# Patient Record
Sex: Male | Born: 1935 | Race: White | Hispanic: No | Smoking: Former smoker
Health system: Southern US, Community
[De-identification: ages and names within clinical notes are randomized; demographics above are authoritative.]

## PROBLEM LIST (undated history)

## (undated) ENCOUNTER — Ambulatory Visit

## (undated) ENCOUNTER — Ambulatory Visit: Discharge status: Absent without leave | Payer: Medicare HMO

## (undated) DIAGNOSIS — F329 Major depressive disorder, single episode, unspecified: Secondary | ICD-10-CM

## (undated) DIAGNOSIS — J42 Unspecified chronic bronchitis: Secondary | ICD-10-CM

## (undated) DIAGNOSIS — J189 Pneumonia, unspecified organism: Secondary | ICD-10-CM

## (undated) DIAGNOSIS — F32A Depression, unspecified: Secondary | ICD-10-CM

## (undated) DIAGNOSIS — K5790 Diverticulosis of intestine, part unspecified, without perforation or abscess without bleeding: Secondary | ICD-10-CM

## (undated) DIAGNOSIS — K219 Gastro-esophageal reflux disease without esophagitis: Secondary | ICD-10-CM

## (undated) DIAGNOSIS — J069 Acute upper respiratory infection, unspecified: Secondary | ICD-10-CM

## (undated) DIAGNOSIS — M109 Gout, unspecified: Secondary | ICD-10-CM

## (undated) DIAGNOSIS — I714 Abdominal aortic aneurysm, without rupture, unspecified: Secondary | ICD-10-CM

## (undated) DIAGNOSIS — I10 Essential (primary) hypertension: Secondary | ICD-10-CM

## (undated) DIAGNOSIS — G8929 Other chronic pain: Secondary | ICD-10-CM

## (undated) DIAGNOSIS — K449 Diaphragmatic hernia without obstruction or gangrene: Secondary | ICD-10-CM

## (undated) DIAGNOSIS — L03115 Cellulitis of right lower limb: Secondary | ICD-10-CM

## (undated) DIAGNOSIS — R350 Frequency of micturition: Secondary | ICD-10-CM

## (undated) DIAGNOSIS — I6529 Occlusion and stenosis of unspecified carotid artery: Secondary | ICD-10-CM

## (undated) DIAGNOSIS — E7800 Pure hypercholesterolemia, unspecified: Secondary | ICD-10-CM

## (undated) DIAGNOSIS — I809 Phlebitis and thrombophlebitis of unspecified site: Secondary | ICD-10-CM

## (undated) DIAGNOSIS — R609 Edema, unspecified: Secondary | ICD-10-CM

## (undated) DIAGNOSIS — E785 Hyperlipidemia, unspecified: Secondary | ICD-10-CM

## (undated) DIAGNOSIS — F419 Anxiety disorder, unspecified: Secondary | ICD-10-CM

## (undated) DIAGNOSIS — M1990 Unspecified osteoarthritis, unspecified site: Secondary | ICD-10-CM

## (undated) DIAGNOSIS — I639 Cerebral infarction, unspecified: Secondary | ICD-10-CM

## (undated) DIAGNOSIS — M545 Low back pain, unspecified: Secondary | ICD-10-CM

## (undated) DIAGNOSIS — J449 Chronic obstructive pulmonary disease, unspecified: Secondary | ICD-10-CM

## (undated) DIAGNOSIS — D472 Monoclonal gammopathy: Secondary | ICD-10-CM

## (undated) HISTORY — DX: Phlebitis and thrombophlebitis of unspecified site: I80.9

## (undated) HISTORY — PX: VASECTOMY: SHX75

## (undated) HISTORY — DX: Chronic obstructive pulmonary disease, unspecified: J44.9

## (undated) HISTORY — DX: Gastro-esophageal reflux disease without esophagitis: K21.9

## (undated) HISTORY — DX: Edema, unspecified: R60.9

## (undated) HISTORY — DX: Essential (primary) hypertension: I10

## (undated) HISTORY — DX: Cerebral infarction, unspecified: I63.9

## (undated) HISTORY — DX: Frequency of micturition: R35.0

## (undated) HISTORY — DX: Monoclonal gammopathy: D47.2

## (undated) HISTORY — DX: Diverticulosis of intestine, part unspecified, without perforation or abscess without bleeding: K57.90

## (undated) HISTORY — PX: APPENDECTOMY: SHX54

## (undated) HISTORY — DX: Gout, unspecified: M10.9

## (undated) HISTORY — DX: Abdominal aortic aneurysm, without rupture, unspecified: I71.40

## (undated) HISTORY — DX: Depression, unspecified: F32.A

## (undated) HISTORY — DX: Major depressive disorder, single episode, unspecified: F32.9

## (undated) HISTORY — DX: Abdominal aortic aneurysm, without rupture: I71.4

## (undated) HISTORY — DX: Hyperlipidemia, unspecified: E78.5

## (undated) HISTORY — PX: EXCISIONAL HEMORRHOIDECTOMY: SHX1541

## (undated) HISTORY — DX: Pure hypercholesterolemia, unspecified: E78.00

## (undated) HISTORY — DX: Diaphragmatic hernia without obstruction or gangrene: K44.9

## (undated) HISTORY — PX: UMBILICAL HERNIA REPAIR: SHX196

## (undated) HISTORY — DX: Occlusion and stenosis of unspecified carotid artery: I65.29

## (undated) HISTORY — PX: HERNIA REPAIR: SHX51

---

## 1997-11-30 ENCOUNTER — Ambulatory Visit (HOSPITAL_BASED_OUTPATIENT_CLINIC_OR_DEPARTMENT_OTHER): Admission: RE | Admit: 1997-11-30 | Discharge: 1997-11-30 | Payer: Self-pay | Admitting: Surgery

## 1999-02-15 ENCOUNTER — Ambulatory Visit (HOSPITAL_COMMUNITY): Admission: RE | Admit: 1999-02-15 | Discharge: 1999-02-15 | Payer: Self-pay | Admitting: Internal Medicine

## 2000-01-03 DIAGNOSIS — I639 Cerebral infarction, unspecified: Secondary | ICD-10-CM

## 2000-01-03 HISTORY — DX: Cerebral infarction, unspecified: I63.9

## 2000-06-26 ENCOUNTER — Ambulatory Visit (HOSPITAL_COMMUNITY): Admission: RE | Admit: 2000-06-26 | Discharge: 2000-06-26 | Payer: Self-pay | Admitting: Orthopaedic Surgery

## 2000-06-26 ENCOUNTER — Encounter: Payer: Self-pay | Admitting: Orthopaedic Surgery

## 2000-11-22 ENCOUNTER — Encounter: Payer: Self-pay | Admitting: Otolaryngology

## 2000-11-22 ENCOUNTER — Ambulatory Visit (HOSPITAL_COMMUNITY): Admission: RE | Admit: 2000-11-22 | Discharge: 2000-11-22 | Payer: Self-pay | Admitting: Otolaryngology

## 2001-01-07 ENCOUNTER — Encounter: Payer: Self-pay | Admitting: Surgery

## 2001-01-07 ENCOUNTER — Encounter: Admission: RE | Admit: 2001-01-07 | Discharge: 2001-01-07 | Payer: Self-pay | Admitting: Surgery

## 2005-03-22 ENCOUNTER — Ambulatory Visit: Payer: Self-pay | Admitting: Hematology & Oncology

## 2007-07-26 ENCOUNTER — Encounter: Admission: RE | Admit: 2007-07-26 | Discharge: 2007-07-26 | Payer: Self-pay | Admitting: Cardiology

## 2007-07-26 ENCOUNTER — Ambulatory Visit: Admission: RE | Admit: 2007-07-26 | Discharge: 2007-07-26 | Payer: Self-pay | Admitting: Internal Medicine

## 2009-08-31 ENCOUNTER — Ambulatory Visit: Payer: Self-pay | Admitting: Vascular Surgery

## 2010-05-17 NOTE — Consult Note (Signed)
NEW PATIENT CONSULTATION   Raymond Morris, Raymond Morris  DOB:  07/24/1931                                       08/31/2009  [id number]#:[id number]   The patient presents today for discussion of a recent diagnosis of  infrarenal abdominal aortic aneurysm.  He is an otherwise healthy  gentleman who underwent a screening ultrasound on June 30, 2009, at  [facility].  I have reviewed his report.  the only significant  finding was of a 3.5-cm infrarenal abdominal aortic aneurysm.  He had no  prior knowledge of this.  He has no symptoms referable to the aneurysm.  He does not have any prior cardiac disease.   PAST HISTORY:  significant for hypertension, elevated cholesterol and  COPD.  He does have a history of back pain which is exertional, and this  does not appear to be related to his aneurysm.  This is in his lumbar,  is equal on both sides, a dull ache, no radiation into his legs.   SOCIAL HISTORY:  he is widowed with 4 children.  He is retired.  He does  not smoke, having quit in 1978.  Does not drink alcohol.   FAMILY HISTORY:  negative for premature atherosclerotic disease.  Mother  died of at 49 with cancer.  Father also died at young age, 71, with  cancer.  One sister had a stroke and liver failure secondary to alcohol  use.   REVIEW OF SYSTEMS:  Does report weight gain up to 237 pounds.  He is 5  feet 9 inches tall.  VASCULAR:  Positive for history of phlebitis.  CARDIAC:  Shortness of breath with exertion.  GI:  Positive for reflux and hiatal hernia.  NEUROLOGIC:  Does have a history of dizziness.  PULMONARY:  asthma and wheezing.  HEMATOLOGIC:  negative.  URINARY:  He does have difficulty with urination at times.  ENT:  Change in eyesight and hearing.  MUSCULOSKELETAL:  Negative aside from back pain.  PSYCHIATRIC:  history of depression.  SKIN:  Negative.   PHYSICAL EXAM:  Well-developed, well-nourished white male appearing his  stated age, in no acute stress.   Blood pressure is 164/98, heart rate is  50, respirations 18, oxygen saturations are 99% on room air.  He is in  no acute distress.  HEENT is normal.  Chest is clear bilaterally with no  rales, rhonchi or wheezes.  Cardiovascular:  His heart is regular rate  and rhythm without murmur.  He does have normal carotid pulses without  bruits bilaterally.  His radial, femoral, popliteal and posterior tibial  pulses are 2+ without any evidence of peripheral aneurysm.  Abdominal  exam reveals obesity.  There is no tenderness.  No mass is noted.  He  does have a diastasis rectus.  Musculoskeletal:  No major deformities or  cyanosis.  Neurologic:  No focal weakness or paresthesias.  Skin:  Without ulcers or rashes.   I reviewed his medical records as provided by Dr. Renne Crigler, also reviewed  his ultrasound.  I had a long discussion with the patient, explaining  the significance of his small infrarenal aneurysm.  I explained that  there is a minimal risk of rupture currently and explained that the  natural history of these is slow, continued growth.  I recommend that we  see him for serial ultrasound followup.  I did review symptoms of  leaking aneurysm.  He knows to call EMS and report to [facility] emergently  should this occur.  He also understands there is no need to alter his  activities regarding his aneurysm.  We will see him again in 1 year with  ultrasound at that time.     Larina Earthly, M.D.  Electronically Signed   TFE/MEDQ  D:  08/31/2009  T:  08/31/2009  Job:  [id number]   cc:   Soyla Murphy. Renne Crigler, M.D.

## 2010-08-02 ENCOUNTER — Other Ambulatory Visit: Payer: Self-pay | Admitting: Internal Medicine

## 2010-08-02 DIAGNOSIS — R10811 Right upper quadrant abdominal tenderness: Secondary | ICD-10-CM

## 2010-08-03 ENCOUNTER — Ambulatory Visit
Admission: RE | Admit: 2010-08-03 | Discharge: 2010-08-03 | Disposition: A | Discharge status: Home / Self Care | Payer: Medicare HMO | Source: Ambulatory Visit | Attending: Internal Medicine | Admitting: Internal Medicine

## 2010-08-03 ENCOUNTER — Other Ambulatory Visit: Payer: Self-pay

## 2010-08-03 DIAGNOSIS — R10811 Right upper quadrant abdominal tenderness: Secondary | ICD-10-CM

## 2010-09-09 ENCOUNTER — Encounter: Payer: Self-pay | Admitting: Vascular Surgery

## 2010-10-24 ENCOUNTER — Encounter: Payer: Self-pay | Admitting: Vascular Surgery

## 2010-10-25 ENCOUNTER — Encounter (INDEPENDENT_AMBULATORY_CARE_PROVIDER_SITE_OTHER): Payer: Medicare HMO | Admitting: *Deleted

## 2010-10-25 ENCOUNTER — Ambulatory Visit (INDEPENDENT_AMBULATORY_CARE_PROVIDER_SITE_OTHER): Payer: Medicare HMO | Admitting: Vascular Surgery

## 2010-10-25 ENCOUNTER — Encounter: Payer: Self-pay | Admitting: Vascular Surgery

## 2010-10-25 VITALS — BP 136/80 | HR 50 | Resp 16 | Ht 68.5 in | Wt 233.4 lb

## 2010-10-25 DIAGNOSIS — I714 Abdominal aortic aneurysm, without rupture: Secondary | ICD-10-CM

## 2010-10-25 NOTE — Progress Notes (Signed)
The patient presents today for continued discussion of his small infrarenal abdominal aortic aneurysm. It seen him in August 2008. He had a ultrasound from an outlying facility showing a 3.5 cm abdominal aortic aneurysm. He has no new symptoms referable to his aneurysm. He does have a new diagnosis of some cardiac difficulties and is to undergo cardiac catheterization.  Past Medical History  Diagnosis Date  . Hyperlipidemia   . Hypertension   . COPD (chronic obstructive pulmonary disease)   . Depression   . Asthma   . GERD (gastroesophageal reflux disease)   . Hiatal hernia   . Phlebitis   . AAA (abdominal aortic aneurysm)     History  Substance Use Topics  . Smoking status: Former Smoker    Types: Cigarettes    Quit date: 09/07/1976  . Smokeless tobacco: Not on file  . Alcohol Use: No    Family History  Problem Relation Age of Onset  . Cancer Mother   . Cancer Father   . Stroke Sister   . Liver disease Sister     Allergies  Allergen Reactions  . Ceclor (Cefaclor)     Causes muscle spasms  . Sulfa Antibiotics Rash    Current outpatient prescriptions:aspirin EC 81 MG tablet, Take 81 mg by mouth daily.  , Disp: , Rfl: ;  cetirizine (ZYRTEC) 10 MG chewable tablet, Chew 10 mg by mouth daily.  , Disp: , Rfl: ;  escitalopram (LEXAPRO) 20 MG tablet, Take 10 mg by mouth daily.  , Disp: , Rfl: ;  esomeprazole (NEXIUM) 20 MG capsule, Take 40 mg by mouth daily before breakfast.  , Disp: , Rfl:  Nebivolol HCl (BYSTOLIC) 20 MG TABS, Take by mouth. Take 1/4 tablet daily , Disp: , Rfl: ;  omega-3 acid ethyl esters (LOVAZA) 1 G capsule, Take 2 g by mouth daily.  , Disp: , Rfl: ;  rosuvastatin (CRESTOR) 20 MG tablet, Take by mouth. Take 1/2 tab every other day, Disp: , Rfl: ;  Tamsulosin HCl (FLOMAX) 0.4 MG CAPS, Take 0.4 mg by mouth daily.  , Disp: , Rfl:   BP 136/80  Pulse 50  Resp 16  Ht 5' 8.5" (1.74 m)  Wt 233 lb 6.4 oz (105.87 kg)  BMI 34.97 kg/m2  Body mass index is 34.97  kg/(m^2).       The school exam well-developed mildly obese white male no acute distress HEENT is normal his radial and posterior tibial pulses are 2+ bilaterally. He does have moderate obesity I cannot palpate an aneurysm. He is grossly intact neurologically.  Ultrasound abdominal aortic aneurysm: Maximal diameter of 2.8 cm.  Impression and plan: Small infrarenal abdominal aortic aneurysm. Discuss this at length with the patient. I spared the specimen essentially no risk for rupture with a small size. Plan to see him again in 2 years with repeat ultrasound for followup of his ectasia of his infrarenal abdominal aorta

## 2010-11-01 NOTE — Procedures (Unsigned)
DUPLEX ULTRASOUND OF ABDOMINAL AORTA  INDICATION:  AAA  HISTORY: Diabetes:  No Cardiac:  No Hypertension:  Yes Smoking:  Quit 1976 Connective Tissue Disorder: Family History:  No Previous Surgery:  No  DUPLEX EXAM:         AP (cm)                   TRANSVERSE (cm) Proximal             Not visualized            Not visualized Mid                  2.79 cm                   2.83 cm Distal               2.79 cm                   2.76 cm Right Iliac          1.34 cm                   1.30 cm Left Iliac           1.26 cm                   1.30 cm  PREVIOUS:  Date:  AP:  TRANSVERSE:  IMPRESSION: 1. Aneurysmal dilatation measuring 2.79 x 2.82 cm. 2. Limited visualization in the proximal and distal segments due to     overlying bowel gas.  ___________________________________________ Larina Earthly, M.D.  EM/MEDQ  D:  10/25/2010  T:  10/25/2010  Job:  [id number]

## 2010-11-10 ENCOUNTER — Encounter: Payer: Self-pay | Admitting: Vascular Surgery

## 2010-11-10 DIAGNOSIS — I714 Abdominal aortic aneurysm, without rupture, unspecified: Secondary | ICD-10-CM | POA: Insufficient documentation

## 2012-09-17 ENCOUNTER — Other Ambulatory Visit: Payer: Self-pay | Admitting: *Deleted

## 2012-09-17 DIAGNOSIS — I714 Abdominal aortic aneurysm, without rupture: Secondary | ICD-10-CM

## 2012-10-07 ENCOUNTER — Encounter: Payer: Self-pay | Admitting: Family

## 2012-10-08 ENCOUNTER — Ambulatory Visit (INDEPENDENT_AMBULATORY_CARE_PROVIDER_SITE_OTHER): Payer: Medicare HMO | Admitting: Family

## 2012-10-08 ENCOUNTER — Encounter: Payer: Self-pay | Admitting: Family

## 2012-10-08 ENCOUNTER — Ambulatory Visit (HOSPITAL_COMMUNITY)
Admission: RE | Admit: 2012-10-08 | Discharge: 2012-10-08 | Disposition: A | Discharge status: Home / Self Care | Payer: Medicare HMO | Source: Ambulatory Visit | Attending: Family | Admitting: Family

## 2012-10-08 VITALS — BP 134/86 | HR 51 | Resp 16 | Ht 69.0 in | Wt 246.0 lb

## 2012-10-08 DIAGNOSIS — I714 Abdominal aortic aneurysm, without rupture, unspecified: Secondary | ICD-10-CM | POA: Insufficient documentation

## 2012-10-08 DIAGNOSIS — Z8673 Personal history of transient ischemic attack (TIA), and cerebral infarction without residual deficits: Secondary | ICD-10-CM

## 2012-10-08 DIAGNOSIS — I6350 Cerebral infarction due to unspecified occlusion or stenosis of unspecified cerebral artery: Secondary | ICD-10-CM

## 2012-10-08 NOTE — Progress Notes (Signed)
[facility] [place]  Established Abdominal Aortic Aneurysm  History of Present Illness  Raymond Morris is a 79 y.o. (03-15-1931) male patient that Dr. Arbie Cookey has been following for his small infrarenal abdominal aortic aneurysm. He had an ultrasound from an outlying facility showing a 3.5 cm abdominal aortic aneurysm.  The patient does have chronic low back pain, and reports abdominal pain only with palpation from a ventral hernia.  The patient is not a smoker. The patient reports claudication symptoms in legs with walking which resolves with rest; he states he had testing of his legs with blood pressures in his ankles requested by his PCP, done about 2 years ago; states he has not heard the results. He describes this as legs feeling weak; he also states that he has low back pain and has been told that he has sciatica. The patient denies history of stroke or TIA symptoms. States he was told that he has blocked arteries in his heart but has had no stents placed nor CABG, followed by Dr. Donnie Aho, pt. States he declined cardiac cath advised by Dr. Donnie Aho. He reports transient mild focal headaches in different areas of his head for about a year.  Pt Diabetic: No  Pt smoker: smoker  (quit 1978 )  Past Medical History  Diagnosis Date  . Hyperlipidemia   . Hypertension   . COPD (chronic obstructive pulmonary disease)   . Depression   . Asthma   . GERD (gastroesophageal reflux disease)   . Hiatal hernia   . Phlebitis   . AAA (abdominal aortic aneurysm)    Past Surgical History  Procedure Laterality Date  . Appendectomy    . Umbilical hernia repair    . Hemorrhoid surgery     Social History History   Social History  . Marital Status: Married    Spouse Name: N/A    Number of Children: N/A  . Years of Education: N/A   Occupational History  . Not on file.   Social History Main Topics  . Smoking status: Former Smoker    Types: Cigarettes    Quit date:  09/07/1976  . Smokeless tobacco: Not on file  . Alcohol Use: No  . Drug Use:   . Sexual Activity:    Other Topics Concern  . Not on file   Social History Narrative  . No narrative on file   Family History Family History  Problem Relation Age of Onset  . Cancer Mother   . Cancer Father   . Stroke Sister   . Liver disease Sister     Current Outpatient Prescriptions on File Prior to Visit  Medication Sig Dispense Refill  . aspirin EC 81 MG tablet Take 81 mg by mouth daily.        . cetirizine (ZYRTEC) 10 MG chewable tablet Chew 10 mg by mouth daily.        Marland Kitchen escitalopram (LEXAPRO) 20 MG tablet Take 10 mg by mouth daily.        Marland Kitchen esomeprazole (NEXIUM) 20 MG capsule Take 40 mg by mouth daily before breakfast.        . Nebivolol HCl (BYSTOLIC) 20 MG TABS Take by mouth. Take 1/4 tablet daily       . omega-3 acid ethyl esters (LOVAZA) 1 G capsule Take 2 g by mouth daily.        . rosuvastatin (CRESTOR) 20 MG tablet Take by mouth. Take 1/2 tab every other day      .  Tamsulosin HCl (FLOMAX) 0.4 MG CAPS Take 0.4 mg by mouth daily.         No current facility-administered medications on file prior to visit.   Allergies  Allergen Reactions  . Ceclor [Cefaclor]     Causes muscle spasms  . Sulfa Antibiotics Rash    ROS: [x]  Positive   [ ]  Negative   [ ]  All sytems reviewed and are negative  General: [ ]  Weight loss, [ ]  Fever, [ ]  chills Neurologic: [ ]  Dizziness, [ ]  Blackouts, [ ]  Seizure [ ]  Stroke, [ ]  "Mini stroke", [ ]  Slurred speech, [ ]  Temporary blindness; [ ]  weakness in arms or legs, [ ]  Hoarseness Cardiac: [ ]  Chest pain/pressure, [ ]  Shortness of breath at rest [ ]  Shortness of breath with exertion, [ ]  Atrial fibrillation or irregular heartbeat Vascular: [ ]  Pain in legs with walking, [ ]  Pain in legs at rest, [ ]  Pain in legs at night,  [ ]  Non-healing ulcer, [ ]  Blood clot in vein/DVT,   Pulmonary: [ ]  Home oxygen, [ ]  Productive cough, [ ]  Coughing up blood, [ ]   Asthma,  [ ]  Wheezing Musculoskeletal:  [ ]  Arthritis, [ ]  Low back pain, [ ]  Joint pain Hematologic: [ ]  Easy Bruising, [ ]  Anemia; [ ]  Hepatitis Gastrointestinal: [ ]  Blood in stool, [ ]  Gastroesophageal Reflux/heartburn, [ ]  Trouble swallowing Urinary: [ ]  chronic Kidney disease, [ ]  on HD - [ ]  MWF or [ ]  TTHS, [ ]  Burning with urination, [ ]  Difficulty urinating Skin: [ ]  Rashes, [ ]  Wounds Psychological: [ ]  Anxiety, [ ]  Depression  Physical Examination  Filed Vitals:   10/08/12 0914  BP: 134/86  Pulse: 51  Resp: 16   Filed Weights   10/08/12 0914  Weight: 246 lb (111.585 kg)   Body mass index is 36.31 kg/(m^2).  General: A&O x 3, WD, Obese.   Pulmonary: Sym exp, good air movt, CTAB, no rales, rhonchi, or wheezing.   Cardiac: RRR, Nl S1, S2, no Murmurs, rubs or gallops.  Carotid Bruits Left Right   Negative Negative   Aorta is not palpable. Radial pulses are 2+ palpable bilaterally.                          VASCULAR EXAM:                                                                                                         LE Pulses LEFT RIGHT       POPLITEAL  not palpable   not palpable       POSTERIOR TIBIAL   palpable    palpable        DORSALIS PEDIS      ANTERIOR TIBIAL palpable   palpable      Gastrointestinal: soft, NTND, -G/R, - HSM, - masses, - CVAT B. Ventral hernia apparent only when pt. is in the process of lying down or sitting up from supine position.  Musculoskeletal: M/S 5/5  throughout, Extremities without ischemic changes.   Neurologic: CN 2-12 intact, Pain and light touch intact in extremities, Motor exam as listed above.  Non-Invasive Vascular Imaging  AAA Duplex (10/08/2012) Previous size: 2.8 cm (Date: 10/25/2010)  Current size:  3.11 x 3.16 cm (Date: 10/08/2012)  Other Imaging:  MRI of head result on file from 2002 :  CLINICAL DATA: VERTIGO. FAMILY HISTORY OF ANEURYSM.  MRI BRAIN WITHOUT AND WITH CONTRAST  NO COMPARISON.   THERE IS MILD ATROPHY. THERE IS A SMALL LEFT FRONTAL WHITE MATTER INFARCT WHICH IS CHRONIC. NO  ACUTE INFARCT IS SEEN AND NO MASS LESION IDENTIFIED. THE ENHANCEMENT PATTERN IS NORMAL.  IMPRESSION  NEGATIVE.  MRA INTRACRANIAL  3D TIME OF FLIGHT TECHNIQUE WAS UTILIZED. NO ANEURYSM IS IDENTIFIED. NO INTRACRANIAL STENOSIS IS  IDENTIFIED.  IMPRESSION  NEGATIVE.  Medical Decision Making  The patient is a 79 y.o. male who presents with asymptomatic AAA with minimal increasing size in 2 years.   Based on this patient's exam and diagnostic studies, the patient will follow up in 2 years  with the following studies: AAA Duplex, 1 month for carotid Duplex since he had CVA by MRA of head in 2002, but no stroke symptoms as far as patient knows.  The threshold for repair is AAA size > 5.5 cm, growth > 1 cm/yr, and symptomatic status.  I emphasized the importance of maximal medical management including strict control of blood pressure, blood glucose, and lipid levels, antiplatelet agents, obtaining regular exercise, and cessation of smoking.   The patient was given information about AAA including signs, symptoms, treatment, and how to minimize the risk of enlargement and rupture of aneurysms.  The patient was given information about stroke prevention and symptoms that should prompt him to seek immediate medical attention.    The patient was advised to call 911 should the patient experience sudden onset abdominal or back pain.   Thank you for allowing [place] to participate in this patient's care.  Charisse March, RN, MSN, FNP-C [facility] [place] Office: [phone]  Clinic Physician: Early  10/08/2012, 9:06 AM

## 2012-10-08 NOTE — Patient Instructions (Addendum)
Abdominal Aortic Aneurysm  An aneurysm is the enlargement (dilatation), bulging, or ballooning out of part of the wall of a vein or artery. An aortic aneurysm is a bulging in the largest artery of the body. This artery supplies blood from the heart to the rest of the body.  The first part of the aorta is called the thoracic aorta. It leaves the heart, rises (ascends), arches, and goes down (descends) through the chest until it reaches the diaphragm. The diaphragm is the muscular part between the chest and abdomen.  The second part of the aorta is called the abdominal aorta after it has passed the diaphragm and continues down through the abdomen. The abdominal aorta ends where it splits to form the two iliac arteries that go to the legs. Aortic aneurysms can develop anywhere along the length of the aorta. The majority are located along the abdominal aorta. The major concern with an aortic aneurysm is that it can enlarge and rupture. This can cause death unless diagnosed and treated promptly. Aneurysms can also develop blood clots or infections. CAUSES  Many aortic aneurysms are caused by arteriosclerosis. Arteriosclerosis can weaken the aortic wall. The pressure of the blood being pumped through the aorta causes it to balloon out at the site of weakness. Therefore, high blood pressure (hypertension) is associated with aneurysm. Other risk factors include:  Age over 60.  Tobacco use.  Being male.  White race.  Family history of aneurysm.  Less frequent causes of abdominal aortic aneurysms include:  Connective tissue diseases.  Abdominal trauma.  Inflammation of blood vessles (arteritis).  Inherited (congenital) malformations.  Infection. SYMPTOMS  The signs and symptoms of an unruptured aneurysm will partly depend on its size and rate of growth.   Abdominal aortic aneurysms may cause pain. The pain typically has a deep quality as if it is piercing into the person. It is felt most  often in the lower back area. The pain is usually steady but may be relieved by changing your body position.  The person may also become aware of an abnormally prominent pulse in the belly (abdominal pulsation). DIAGNOSIS  An aortic aneurysm may be discovered by chance on physical exam, or on X-ray studies done for other reasons. It may be suspected because of other problems such as back or abdominal pain. The following tests may help identify the problem.  X-rays of the abdomen can show calcium deposits in the aneurysm wall.  CT scanning of the abdomen, particularly with contrast medium, is accurate at showing the exact size and shape of the aneurysm.  Ultrasounds give a clear picture of the size of an aneurysm (about 98% accuracy).  MRI scanning is accurate, but often unnecessary.  An abdominal angiogram shows the source of the major blood vessels arising from the aorta. It reveals the size and extent of any aneurysm. It can also show a clot clinging to the wall of the aneurysm (mural thrombus). TREATMENT  Treating an abdominal aortic aneurysm depends on the size. A rupture of an aneurysm is uncommon when they are less than 5 cm wide (2 inches). Rupture is far more common in aneurysms that are over 6 cm wide (2.4 inches).  Surgical repair is usually recommended for all aneurysms over 6 cm wide (2.4 inches). This depends on the health, age, and other circumstances of the individual. This type of surgery consists of opening the abdomen, removing the aneurysm, and sewing a synthetic graft (similar to a cloth tube) in its place. A   less invasive form of this surgery, using stent grafts, is sometimes recommended.  For most patients, elective repair is recommended for aneurysms between 4 and 6 cm (1.6 and 2.4 inches). Elective means the surgery can be done at your convenience. This should not be put off too long if surgery is recommended.  If you smoke, stop immediately. Smoking is a major risk  factor for enlargement and rupture.  Medications may be used to help decrease complications  these include medicine to lower blood pressure and control cholesterol. HOME CARE INSTRUCTIONS   If you smoke, stop. Do not start smoking.  Take all medications as prescribed.  Your caregiver will tell you when to have your aneurysm rechecked, either by ultrasound or CT scan.  If your caregiver has given you a follow-up appointment, it is very important to keep that appointment. Not keeping the appointment could result in a chronic or permanent injury, pain, or disability. If there is any problem keeping the appointment, you must call back to this facility for assistance. SEEK MEDICAL CARE IF:   You develop mild abdominal pain or pressure.  You are able to feel or perceive your aneurysm, and you sense any change. SEEK IMMEDIATE MEDICAL CARE IF:   You develop severe abdominal pain, or severe pain moving (radiating) to your back.  You suddenly develop cold or blue toes or feet.  You suddenly develop lightheadedness or fainting spells. MAKE SURE YOU:   Understand these instructions.  Will watch your condition.  Will get help right away if you are not doing well or get worse. Document Released: 09/28/2004 Document Revised: 03/13/2011 Document Reviewed: 07/23/2007 ExitCare Patient Information 2014 [employer]. Stroke Prevention Some medical conditions and behaviors are associated with an increased chance of having a stroke. You may prevent a stroke by making healthy choices and managing medical conditions. Reduce your risk of having a stroke by:  Staying physically active. Get at least 30 minutes of activity on most or all days.  Not smoking. It may also be helpful to avoid exposure to secondhand smoke.  Limiting alcohol use. Moderate alcohol use is considered to be:  No more than 2 drinks per day for men.  No more than 1 drink per day for nonpregnant women.  Eating healthy  foods.  Include 5 or more servings of fruits and vegetables a day.  Certain diets may be prescribed to address high blood pressure, high cholesterol, diabetes, or obesity.  Managing your cholesterol levels.  A low-saturated fat, low-trans fat, low-cholesterol, and high-fiber diet may control cholesterol levels.  Take any prescribed medicines to control cholesterol as directed by your caregiver.  Managing your diabetes.  A controlled-carbohydrate, controlled-sugar diet is recommended to manage diabetes.  Take any prescribed medicines to control diabetes as directed by your caregiver.  Controlling your high blood pressure (hypertension).  A low-salt (sodium), low-saturated fat, low-trans fat, and low-cholesterol diet is recommended to manage high blood pressure.  Take any prescribed medicines to control hypertension as directed by your caregiver.  Maintaining a healthy weight.  A reduced-calorie, low-sodium, low-saturated fat, low-trans fat, low-cholesterol diet is recommended to manage weight.  Stopping drug abuse.  Avoiding birth control pills.  Talk to your caregiver about the risks of taking birth control pills if you are over 35 years old, smoke, get migraines, or have ever had a blood clot.  Getting evaluated for sleep disorders (sleep apnea).  Talk to your caregiver about getting a sleep evaluation if you snore a lot or have   excessive sleepiness.  Taking medicines as directed by your caregiver.  For some people, aspirin or blood thinners (anticoagulants) are helpful in reducing the risk of forming abnormal blood clots that can lead to stroke. If you have the irregular heart rhythm of atrial fibrillation, you should be on a blood thinner unless there is a good reason you cannot take them.  Understand all your medicine instructions. SEEK IMMEDIATE MEDICAL CARE IF:   You have sudden weakness or numbness of the face, arm, or leg, especially on one side of the  body.  You have sudden confusion.  You have trouble speaking (aphasia) or understanding.  You have sudden trouble seeing in one or both eyes.  You have sudden trouble walking.  You have dizziness.  You have a loss of balance or coordination.  You have a sudden, severe headache with no known cause.  You have new chest pain or an irregular heartbeat. Any of these symptoms may represent a serious problem that is an emergency. Do not wait to see if the symptoms will go away. Get medical help right away. Call your local emergency services (911 in [place] Do not drive yourself to the hospital. Document Released: 01/27/2004 Document Revised: 03/13/2011 Document Reviewed: 08/08/2010 Exit[employer] Patient Information 2014 [employer].  

## 2012-10-09 NOTE — Addendum Note (Signed)
Addended by: Adria Dill L on: 10/09/2012 11:45 AM   Modules accepted: Orders

## 2012-10-09 NOTE — Addendum Note (Signed)
Addended by: Adria Dill L on: 10/09/2012 03:25 PM   Modules accepted: Orders

## 2012-11-01 ENCOUNTER — Encounter: Payer: Self-pay | Admitting: Family

## 2012-11-04 ENCOUNTER — Encounter: Payer: Self-pay | Admitting: Family

## 2012-11-04 ENCOUNTER — Other Ambulatory Visit: Payer: Self-pay | Admitting: Family

## 2012-11-04 ENCOUNTER — Ambulatory Visit (INDEPENDENT_AMBULATORY_CARE_PROVIDER_SITE_OTHER): Payer: Medicare HMO | Admitting: Family

## 2012-11-04 ENCOUNTER — Ambulatory Visit (HOSPITAL_COMMUNITY)
Admission: RE | Admit: 2012-11-04 | Discharge: 2012-11-04 | Disposition: A | Discharge status: Home / Self Care | Payer: Medicare HMO | Source: Ambulatory Visit | Attending: Family | Admitting: Family

## 2012-11-04 DIAGNOSIS — I6350 Cerebral infarction due to unspecified occlusion or stenosis of unspecified cerebral artery: Secondary | ICD-10-CM

## 2012-11-04 DIAGNOSIS — Z8679 Personal history of other diseases of the circulatory system: Secondary | ICD-10-CM

## 2012-11-04 DIAGNOSIS — I6529 Occlusion and stenosis of unspecified carotid artery: Secondary | ICD-10-CM | POA: Insufficient documentation

## 2012-11-04 NOTE — Patient Instructions (Signed)
Stroke Prevention Some medical conditions and behaviors are associated with an increased chance of having a stroke. You may prevent a stroke by making healthy choices and managing medical conditions. Reduce your risk of having a stroke by:  Staying physically active. Get at least 30 minutes of activity on most or all days.  Not smoking. It may also be helpful to avoid exposure to secondhand smoke.  Limiting alcohol use. Moderate alcohol use is considered to be:  No more than 2 drinks per day for men.  No more than 1 drink per day for nonpregnant women.  Eating healthy foods.  Include 5 or more servings of fruits and vegetables a day.  Certain diets may be prescribed to address high blood pressure, high cholesterol, diabetes, or obesity.  Managing your cholesterol levels.  A low-saturated fat, low-trans fat, low-cholesterol, and high-fiber diet may control cholesterol levels.  Take any prescribed medicines to control cholesterol as directed by your caregiver.  Managing your diabetes.  A controlled-carbohydrate, controlled-sugar diet is recommended to manage diabetes.  Take any prescribed medicines to control diabetes as directed by your caregiver.  Controlling your high blood pressure (hypertension).  A low-salt (sodium), low-saturated fat, low-trans fat, and low-cholesterol diet is recommended to manage high blood pressure.  Take any prescribed medicines to control hypertension as directed by your caregiver.  Maintaining a healthy weight.  A reduced-calorie, low-sodium, low-saturated fat, low-trans fat, low-cholesterol diet is recommended to manage weight.  Stopping drug abuse.  Avoiding birth control pills.  Talk to your caregiver about the risks of taking birth control pills if you are over 35 years old, smoke, get migraines, or have ever had a blood clot.  Getting evaluated for sleep disorders (sleep apnea).  Talk to your caregiver about getting a sleep evaluation  if you snore a lot or have excessive sleepiness.  Taking medicines as directed by your caregiver.  For some people, aspirin or blood thinners (anticoagulants) are helpful in reducing the risk of forming abnormal blood clots that can lead to stroke. If you have the irregular heart rhythm of atrial fibrillation, you should be on a blood thinner unless there is a good reason you cannot take them.  Understand all your medicine instructions. SEEK IMMEDIATE MEDICAL CARE IF:   You have sudden weakness or numbness of the face, arm, or leg, especially on one side of the body.  You have sudden confusion.  You have trouble speaking (aphasia) or understanding.  You have sudden trouble seeing in one or both eyes.  You have sudden trouble walking.  You have dizziness.  You have a loss of balance or coordination.  You have a sudden, severe headache with no known cause.  You have new chest pain or an irregular heartbeat. Any of these symptoms may represent a serious problem that is an emergency. Do not wait to see if the symptoms will go away. Get medical help right away. Call your local emergency services (911 in [place] Do not drive yourself to the hospital. Document Released: 01/27/2004 Document Revised: 03/13/2011 Document Reviewed: 08/08/2010 [employer] Patient Information 2014 [employer].  

## 2012-11-04 NOTE — Progress Notes (Signed)
Established Carotid Patient   History of Present Illness  Raymond Morris is a 79 y.o. 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 He was seen last month for scheduled surveillance of AAA. Patient has not had previous CEA. Has chronic low back pain, was advised years ago that he has DDD, describes what sounds like bilateral sciatic pain with walking. He does not walk much due to this. States he has not taken his medications today yet as he did not not if he should before this visit; advised to take his medications as soon as he gets home, is slightly hypertensive now.  Patient denies New Medical or Surgical History.  Pt Diabetic: No Pt smoker: former smoker, quit 1978  Pt meds include: Statin : Yes Betablocker: Yes ASA: Yes Other anticoagulants/antiplatelets: no   Past Medical History  Diagnosis Date  . Hyperlipidemia   . Hypertension   . COPD (chronic obstructive pulmonary disease)   . Depression   . Asthma   . GERD (gastroesophageal reflux disease)   . Hiatal hernia   . Phlebitis   . AAA (abdominal aortic aneurysm)     Social History History  Substance Use Topics  . Smoking status: Former Smoker    Types: Cigarettes    Quit date: 09/07/1976  . Smokeless tobacco: Never Used  . Alcohol Use: No    Family History Family History  Problem Relation Age of Onset  . Cancer Mother   . Cancer Father   . Stroke Sister   . Liver disease Sister     Surgical History Past Surgical History  Procedure Laterality Date  . Appendectomy    . Umbilical hernia repair    . Hemorrhoid surgery      Allergies  Allergen Reactions  . Ceclor [Cefaclor]     Causes muscle spasms  . Sulfa Antibiotics Rash    Current Outpatient Prescriptions  Medication Sig Dispense Refill  . aspirin EC 81 MG tablet Take 81 mg by mouth daily.        . cetirizine (ZYRTEC) 10 MG chewable tablet Chew  10 mg by mouth daily.        Marland Kitchen escitalopram (LEXAPRO) 20 MG tablet Take 10 mg by mouth daily.        Marland Kitchen esomeprazole (NEXIUM) 20 MG capsule Take 40 mg by mouth daily before breakfast.        . hydrochlorothiazide (HYDRODIURIL) 25 MG tablet Take 25 mg by mouth daily.      . Nebivolol HCl (BYSTOLIC) 20 MG TABS Take by mouth. Take 1/4 tablet daily       . omega-3 acid ethyl esters (LOVAZA) 1 G capsule Take 2 g by mouth daily.        . rosuvastatin (CRESTOR) 20 MG tablet Take by mouth. Take 1/2 tab every other day      . Tamsulosin HCl (FLOMAX) 0.4 MG CAPS Take 0.4 mg by mouth daily.         No current facility-administered medications for this visit.    Review of Systems : [x]  Positive   [ ]  Denies  General:[ ]  Weight loss,  [ ]  Weight gain, [ ]  Loss of appetite, [ ]  Fever, [ ]  chills  Neurologic: [ ]  Dizziness, [ ]  Blackouts, [ ]  Headaches, [ ]  Seizure [ ]  Stroke, [ ]  "Mini stroke", [ ]  Slurred speech, [ ]   Temporary blindness;  [ ] weakness,  Ear/Nose/Throat: [ ]  Change in hearing, [ ]  Nose bleeds, [ ]  Hoarseness  Vascular:[ ]  Pain in legs with walking, [ ]  Pain in feet while lying flat , [ ]   Non-healing ulcer, [ ]  Blood clot in vein,    Pulmonary: [ ]  Home oxygen, [ ]   Productive cough, [ ]  Bronchitis, [ ]  Coughing up blood,  [ ]  Asthma, [ ]  Wheezing  Musculoskeletal:  [ ]  Arthritis, [ ]  Joint pain, [ ]  low back pain  Cardiac: [ ]  Chest pain, [ ]  Shortness of breath when lying flat, [ ]  Shortness of breath with exertion, [ ]  Palpitations, [ ]  Heart murmur, [ ]   Atrial fibrillation  Hematologic:[ ]  Easy Bruising, [ ]  Anemia; [ ]  Hepatitis  Psychiatric: [ ]   Depression, [ ]  Anxiety   Gastrointestinal: [ ]  Black stool, [ ]  Blood in stool, [ ]  Peptic ulcer disease,  [ ]  Gastroesophageal Reflux, [ ]  Trouble swallowing, [ ]  Diarrhea, [ ]  Constipation  Urinary: [ ]  chronic Kidney disease, [ ]  on HD, [ ]  Burning with urination, [ ]  Frequent urination, [ ]  Difficulty urinating;   Skin: [ ]   Rashes, [ ]  Wounds    Physical Examination  Filed Vitals:   11/04/12 1229  BP: 146/82  Pulse: 54  Resp:    Filed Weights   11/04/12 1225  Weight: 246 lb (111.585 kg)   Body mass index is 36.31 kg/(m^2).  General: WDWN male in NAD, obese. GAIT: antalgic Eyes: PERRLA Pulmonary:  CTAB, Negative  Rales, Negative rhonchi, & transient expiratory wheezing in left base.  Cardiac: regular Rhythm ,  Positive Murmurs.  VASCULAR EXAM Carotid Bruits Left Right   Negative Negative   Aorta is not palpable.  Radial pulses are 2+ palpable bilaterally.   LE Pulses  LEFT  RIGHT   POPLITEAL  not palpable  not palpable   POSTERIOR TIBIAL  palpable  palpable   DORSALIS PEDIS  ANTERIOR TIBIAL  palpable  palpable     Gastrointestinal: soft, NTND, -G/R, - HSM, - masses, - CVAT B. Ventral hernia apparent only when pt. is in the process of lying down or sitting up from supine position.   Musculoskeletal: M/S 5/5 throughout, Extremities without ischemic changes.   Neurologic: CN 2-12 intact, Pain and light touch intact in extremities, Motor exam as listed above.   Non-Invasive Vascular Imaging  AAA Duplex (10/08/2012)  Previous size: 2.8 cm (Date: 10/25/2010)  Most recent size: 3.11 x 3.16 cm (Date: 10/08/2012)  Non-Invasive Vascular Imaging CAROTID DUPLEX 11/04/2012   Right ICA: <40% stenosis. Left ICA: no stenosis.  No previous studies for comparison.  Assessment: Raymond Morris is a 79 y.o. 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 He was seen last month for scheduled surveillance of AAA. He is asymptomatic with  <40% Right ICA stenosis and no left ICA stenosis. No previous carotid studies for comparison; will not need to repeat carotid studies since he has no ICA disease. CVD risk factors and healthy lifestyle measures discussed.  Plan:  He is already scheduled to return in 2  years for AAA Duplex.  I discussed in depth with the patient the nature of atherosclerosis, and emphasized the importance of maximal medical management including strict control of blood pressure, blood glucose, and lipid levels, obtaining regular exercise, and continued cessation of  smoking.  The patient is aware that without maximal medical management the underlying atherosclerotic disease process will progress, limiting the benefit of any interventions. The patient was given information about stroke prevention and what symptoms should prompt the patient to seek immediate medical care. Thank you for allowing [place] to participate in this patient's care.  Charisse March, RN, MSN, FNP-C [facility] Office: [phone]  Clinic Physician: Myra Gianotti  11/04/2012 11:23 AM

## 2012-11-14 ENCOUNTER — Ambulatory Visit: Payer: Medicare HMO | Admitting: Family

## 2012-11-14 ENCOUNTER — Other Ambulatory Visit (HOSPITAL_COMMUNITY): Payer: Medicare HMO

## 2013-07-29 ENCOUNTER — Other Ambulatory Visit (HOSPITAL_COMMUNITY): Payer: Self-pay | Admitting: Internal Medicine

## 2013-07-29 DIAGNOSIS — R1310 Dysphagia, unspecified: Secondary | ICD-10-CM

## 2013-08-01 ENCOUNTER — Ambulatory Visit (HOSPITAL_COMMUNITY)
Admission: RE | Admit: 2013-08-01 | Discharge: 2013-08-01 | Disposition: A | Discharge status: Home / Self Care | Payer: Medicare HMO | Source: Ambulatory Visit | Attending: Internal Medicine | Admitting: Internal Medicine

## 2013-08-01 DIAGNOSIS — F329 Major depressive disorder, single episode, unspecified: Secondary | ICD-10-CM | POA: Diagnosis not present

## 2013-08-01 DIAGNOSIS — K449 Diaphragmatic hernia without obstruction or gangrene: Secondary | ICD-10-CM | POA: Insufficient documentation

## 2013-08-01 DIAGNOSIS — K219 Gastro-esophageal reflux disease without esophagitis: Secondary | ICD-10-CM | POA: Diagnosis not present

## 2013-08-01 DIAGNOSIS — R1310 Dysphagia, unspecified: Secondary | ICD-10-CM | POA: Insufficient documentation

## 2013-08-01 DIAGNOSIS — I2510 Atherosclerotic heart disease of native coronary artery without angina pectoris: Secondary | ICD-10-CM | POA: Diagnosis not present

## 2013-08-01 DIAGNOSIS — J449 Chronic obstructive pulmonary disease, unspecified: Secondary | ICD-10-CM | POA: Diagnosis not present

## 2013-08-01 DIAGNOSIS — F3289 Other specified depressive episodes: Secondary | ICD-10-CM | POA: Insufficient documentation

## 2013-08-01 DIAGNOSIS — I10 Essential (primary) hypertension: Secondary | ICD-10-CM | POA: Diagnosis not present

## 2013-08-01 DIAGNOSIS — E785 Hyperlipidemia, unspecified: Secondary | ICD-10-CM | POA: Insufficient documentation

## 2013-08-01 DIAGNOSIS — J4489 Other specified chronic obstructive pulmonary disease: Secondary | ICD-10-CM | POA: Insufficient documentation

## 2013-08-01 DIAGNOSIS — R6889 Other general symptoms and signs: Secondary | ICD-10-CM | POA: Diagnosis present

## 2013-08-01 NOTE — Procedures (Signed)
Objective Swallowing Evaluation: Modified Barium Swallowing Study  Patient Details  Name: Raymond Morris MRN: [id number] Date of Birth: 1931-09-14  Today's Date: 08/01/2013 Time: 2841-3244 SLP Time Calculation (min): 26 min  Past Medical History:  Past Medical History  Diagnosis Date  . Hyperlipidemia   . Hypertension   . COPD (chronic obstructive pulmonary disease)   . Depression   . Asthma   . GERD (gastroesophageal reflux disease)   . Hiatal hernia   . Phlebitis   . AAA (abdominal aortic aneurysm)   . Carotid artery occlusion    Past Surgical History:  Past Surgical History  Procedure Laterality Date  . Appendectomy    . Umbilical hernia repair    . Hemorrhoid surgery     HPI:  80 yo male was referred for OP MBS due to c/o choking easily and having sensation of food sticking in throat.  PMH: CVA in 2002 with minimal symptoms; GERD (on nexium), Hiatal hernia, COPD, CAD.     Assessment / Plan / Recommendation Clinical Impression  Dysphagia Diagnosis: Within Functional Limits Clinical impression: Patient exhibits normal swallow function with all consistencies.  Oral prep was efficient, timely swallow initiation, no aspiration or penetration, and no laryngeal residue.  During the esophageal sweep there was a mild delay for the distal esophagus to clear, but the 36mm barium tablet was able to pass with only min. delay.  After the study, the patient described difficulty with particle type foods, such as rice and cornbread, and also mentioned waking at night coughing/choking and burning sensation, c/w reflux.  Pt stated his choking episodes are sporadic and inconsistent, and more likely when he is talking, eating quickly, and not paying attention.    Treatment Recommendation       Diet Recommendation Dysphagia 3 (Mechanical Soft);Thin liquid (avoid particle foods)   Liquid Administration via: Cup;Straw Medication Administration: Whole meds with liquid Supervision: Patient  able to self feed Compensations: Slow rate;Small sips/bites;Follow solids with liquid Postural Changes and/or Swallow Maneuvers: Seated upright 90 degrees;Upright 30-60 min after meal    Other  Recommendations Oral Care Recommendations: Oral care BID Other Recommendations: Clarify dietary restrictions   Follow Up Recommendations  None    Frequency and Duration        Pertinent Vitals/Pain n/a       General HPI: 80 yo male was referred for OP MBS due to c/o choking easily and having sensation of food sticking in throat.  PMH: CVA in 2002 with minimal symptoms; GERD (on nexium), Hiatal hernia, COPD, CAD. Type of Study: Modified Barium Swallowing Study Reason for Referral: Objectively evaluate swallowing function Previous Swallow Assessment: none Diet Prior to this Study: Regular;Thin liquids Temperature Spikes Noted: No Respiratory Status: Room air History of Recent Intubation: No Behavior/Cognition: Alert;Cooperative;Pleasant mood Oral Cavity - Dentition: Adequate natural dentition Oral Motor / Sensory Function: Within functional limits Self-Feeding Abilities: Able to feed self Patient Positioning: Upright in chair Baseline Vocal Quality: Clear;Low vocal intensity;Hoarse Volitional Cough: Strong Volitional Swallow: Able to elicit Anatomy: Within functional limits Pharyngeal Secretions: Not observed secondary MBS    Reason for Referral Objectively evaluate swallowing function   Oral Phase Oral Preparation/Oral Phase Oral Phase: WFL   Pharyngeal Phase Pharyngeal Phase Pharyngeal Phase: Within functional limits  Cervical Esophageal Phase    GO    Cervical Esophageal Phase Cervical Esophageal Phase: [facility]    Functional Assessment Tool Used: clinical judgement Functional Limitations: Swallowing Swallow Current Status (W1027): 0 percent impaired, limited or restricted Swallow Goal  Status [phone]): 0 percent impaired, limited or restricted Swallow Discharge Status  [phone]): 0 percent impaired, limited or restricted    Quinn Axe T 08/01/2013, 2:10 PM

## 2013-10-02 DIAGNOSIS — L03115 Cellulitis of right lower limb: Secondary | ICD-10-CM

## 2013-10-02 HISTORY — DX: Cellulitis of right lower limb: L03.115

## 2013-10-08 ENCOUNTER — Encounter (HOSPITAL_COMMUNITY): Payer: Self-pay | Admitting: Emergency Medicine

## 2013-10-08 ENCOUNTER — Inpatient Hospital Stay (HOSPITAL_COMMUNITY)
Admission: EM | Admit: 2013-10-08 | Discharge: 2013-10-13 | DRG: 603 | Disposition: A | Discharge status: Home / Self Care | Payer: Medicare HMO | Attending: Internal Medicine | Admitting: Internal Medicine

## 2013-10-08 ENCOUNTER — Emergency Department (HOSPITAL_COMMUNITY): Payer: Medicare HMO

## 2013-10-08 DIAGNOSIS — Z86718 Personal history of other venous thrombosis and embolism: Secondary | ICD-10-CM | POA: Diagnosis not present

## 2013-10-08 DIAGNOSIS — Z87891 Personal history of nicotine dependence: Secondary | ICD-10-CM | POA: Diagnosis not present

## 2013-10-08 DIAGNOSIS — N400 Enlarged prostate without lower urinary tract symptoms: Secondary | ICD-10-CM | POA: Diagnosis present

## 2013-10-08 DIAGNOSIS — F419 Anxiety disorder, unspecified: Secondary | ICD-10-CM | POA: Diagnosis present

## 2013-10-08 DIAGNOSIS — W57XXXA Bitten or stung by nonvenomous insect and other nonvenomous arthropods, initial encounter: Secondary | ICD-10-CM | POA: Diagnosis present

## 2013-10-08 DIAGNOSIS — Z7982 Long term (current) use of aspirin: Secondary | ICD-10-CM | POA: Diagnosis not present

## 2013-10-08 DIAGNOSIS — E785 Hyperlipidemia, unspecified: Secondary | ICD-10-CM | POA: Diagnosis present

## 2013-10-08 DIAGNOSIS — I714 Abdominal aortic aneurysm, without rupture: Secondary | ICD-10-CM | POA: Diagnosis present

## 2013-10-08 DIAGNOSIS — J45909 Unspecified asthma, uncomplicated: Secondary | ICD-10-CM | POA: Diagnosis present

## 2013-10-08 DIAGNOSIS — Z8673 Personal history of transient ischemic attack (TIA), and cerebral infarction without residual deficits: Secondary | ICD-10-CM

## 2013-10-08 DIAGNOSIS — I10 Essential (primary) hypertension: Secondary | ICD-10-CM | POA: Diagnosis present

## 2013-10-08 DIAGNOSIS — M109 Gout, unspecified: Secondary | ICD-10-CM | POA: Diagnosis present

## 2013-10-08 DIAGNOSIS — F329 Major depressive disorder, single episode, unspecified: Secondary | ICD-10-CM | POA: Diagnosis present

## 2013-10-08 DIAGNOSIS — M79671 Pain in right foot: Secondary | ICD-10-CM | POA: Diagnosis present

## 2013-10-08 DIAGNOSIS — E876 Hypokalemia: Secondary | ICD-10-CM | POA: Diagnosis present

## 2013-10-08 DIAGNOSIS — K219 Gastro-esophageal reflux disease without esophagitis: Secondary | ICD-10-CM | POA: Diagnosis present

## 2013-10-08 DIAGNOSIS — L03115 Cellulitis of right lower limb: Secondary | ICD-10-CM | POA: Diagnosis not present

## 2013-10-08 DIAGNOSIS — J449 Chronic obstructive pulmonary disease, unspecified: Secondary | ICD-10-CM | POA: Diagnosis present

## 2013-10-08 DIAGNOSIS — I6529 Occlusion and stenosis of unspecified carotid artery: Secondary | ICD-10-CM | POA: Diagnosis present

## 2013-10-08 DIAGNOSIS — Z79899 Other long term (current) drug therapy: Secondary | ICD-10-CM

## 2013-10-08 DIAGNOSIS — L0390 Cellulitis, unspecified: Secondary | ICD-10-CM | POA: Insufficient documentation

## 2013-10-08 HISTORY — DX: Low back pain, unspecified: M54.50

## 2013-10-08 HISTORY — DX: Cellulitis of right lower limb: L03.115

## 2013-10-08 HISTORY — DX: Unspecified osteoarthritis, unspecified site: M19.90

## 2013-10-08 HISTORY — DX: Low back pain: M54.5

## 2013-10-08 HISTORY — DX: Pneumonia, unspecified organism: J18.9

## 2013-10-08 HISTORY — DX: Acute upper respiratory infection, unspecified: J06.9

## 2013-10-08 HISTORY — DX: Cerebral infarction, unspecified: I63.9

## 2013-10-08 HISTORY — DX: Anxiety disorder, unspecified: F41.9

## 2013-10-08 HISTORY — DX: Other chronic pain: G89.29

## 2013-10-08 HISTORY — DX: Unspecified chronic bronchitis: J42

## 2013-10-08 LAB — CBC WITH DIFFERENTIAL/PLATELET
BASOS PCT: 0 % (ref 0–1)
Basophils Absolute: 0 10*3/uL (ref 0.0–0.1)
EOS PCT: 2 % (ref 0–5)
Eosinophils Absolute: 0.2 10*3/uL (ref 0.0–0.7)
HCT: 37.8 % — ABNORMAL LOW (ref 39.0–52.0)
HEMOGLOBIN: 12.7 g/dL — AB (ref 13.0–17.0)
LYMPHS ABS: 2.2 10*3/uL (ref 0.7–4.0)
Lymphocytes Relative: 18 % (ref 12–46)
MCH: 32 pg (ref 26.0–34.0)
MCHC: 33.6 g/dL (ref 30.0–36.0)
MCV: 95.2 fL (ref 78.0–100.0)
MONO ABS: 1.2 10*3/uL — AB (ref 0.1–1.0)
Monocytes Relative: 10 % (ref 3–12)
NEUTROS PCT: 70 % (ref 43–77)
Neutro Abs: 8.4 10*3/uL — ABNORMAL HIGH (ref 1.7–7.7)
Platelets: 242 10*3/uL (ref 150–400)
RBC: 3.97 MIL/uL — AB (ref 4.22–5.81)
RDW: 13.8 % (ref 11.5–15.5)
WBC: 12 10*3/uL — ABNORMAL HIGH (ref 4.0–10.5)

## 2013-10-08 LAB — BASIC METABOLIC PANEL
Anion gap: 12 (ref 5–15)
BUN: 21 mg/dL (ref 6–23)
CALCIUM: 8.5 mg/dL (ref 8.4–10.5)
CO2: 30 mEq/L (ref 19–32)
Chloride: 100 mEq/L (ref 96–112)
Creatinine, Ser: 1 mg/dL (ref 0.50–1.35)
GFR calc Af Amer: 80 mL/min — ABNORMAL LOW (ref >90)
GFR calc non Af Amer: 69 mL/min — ABNORMAL LOW (ref >90)
GLUCOSE: 99 mg/dL (ref 70–99)
Potassium: 3.1 mEq/L — ABNORMAL LOW (ref 3.7–5.3)
SODIUM: 142 meq/L (ref 137–147)

## 2013-10-08 LAB — I-STAT CHEM 8, ED
BUN: 21 mg/dL (ref 6–23)
CHLORIDE: 98 meq/L (ref 96–112)
CREATININE: 1.1 mg/dL (ref 0.50–1.35)
Calcium, Ion: 1.15 mmol/L (ref 1.13–1.30)
GLUCOSE: 101 mg/dL — AB (ref 70–99)
HCT: 39 % (ref 39.0–52.0)
Hemoglobin: 13.3 g/dL (ref 13.0–17.0)
POTASSIUM: 2.8 meq/L — AB (ref 3.7–5.3)
Sodium: 140 mEq/L (ref 137–147)
TCO2: 30 mmol/L (ref 0–100)

## 2013-10-08 MED ORDER — HYDROCODONE-ACETAMINOPHEN 5-325 MG PO TABS
1.0000 | ORAL_TABLET | ORAL | Status: DC | PRN
  Administered 2013-10-08: 2 via ORAL
  Filled 2013-10-08: qty 2

## 2013-10-08 MED ORDER — CIPROFLOXACIN HCL 500 MG PO TABS
500.0000 mg | ORAL_TABLET | Freq: Two times a day (BID) | ORAL | Status: DC
  Administered 2013-10-08 – 2013-10-12 (×8): 500 mg via ORAL
  Filled 2013-10-08 (×12): qty 1

## 2013-10-08 MED ORDER — POTASSIUM CHLORIDE CRYS ER 20 MEQ PO TBCR
40.0000 meq | EXTENDED_RELEASE_TABLET | ORAL | Status: AC
  Administered 2013-10-08 (×2): 40 meq via ORAL
  Filled 2013-10-08 (×2): qty 2

## 2013-10-08 MED ORDER — ALUM & MAG HYDROXIDE-SIMETH 200-200-20 MG/5ML PO SUSP: 30.0000 mL | Freq: Four times a day (QID) | ORAL | Status: DC | PRN

## 2013-10-08 MED ORDER — SODIUM CHLORIDE 0.9 % IV SOLN
1500.0000 mg | Freq: Once | INTRAVENOUS | Status: AC
  Administered 2013-10-08: 1500 mg via INTRAVENOUS
  Filled 2013-10-08: qty 1500

## 2013-10-08 MED ORDER — ENOXAPARIN SODIUM 40 MG/0.4ML ~~LOC~~ SOLN
40.0000 mg | SUBCUTANEOUS | Status: DC
  Administered 2013-10-08 – 2013-10-10 (×3): 40 mg via SUBCUTANEOUS
  Filled 2013-10-08 (×5): qty 0.4

## 2013-10-08 MED ORDER — ONDANSETRON HCL 4 MG/2ML IJ SOLN: 4.0000 mg | Freq: Four times a day (QID) | INTRAMUSCULAR | Status: DC | PRN

## 2013-10-08 MED ORDER — ESCITALOPRAM OXALATE 10 MG PO TABS
10.0000 mg | ORAL_TABLET | Freq: Every day | ORAL | Status: DC
  Administered 2013-10-08 – 2013-10-13 (×6): 10 mg via ORAL
  Filled 2013-10-08 (×7): qty 1

## 2013-10-08 MED ORDER — FENTANYL CITRATE 0.05 MG/ML IJ SOLN
50.0000 ug | Freq: Once | INTRAMUSCULAR | Status: AC
  Administered 2013-10-08: 50 ug via INTRAVENOUS
  Filled 2013-10-08: qty 2

## 2013-10-08 MED ORDER — LORATADINE 10 MG PO TABS
10.0000 mg | ORAL_TABLET | Freq: Every day | ORAL | Status: DC
  Administered 2013-10-08 – 2013-10-13 (×6): 10 mg via ORAL
  Filled 2013-10-08 (×6): qty 1

## 2013-10-08 MED ORDER — TAMSULOSIN HCL 0.4 MG PO CAPS
0.4000 mg | ORAL_CAPSULE | Freq: Every day | ORAL | Status: DC
  Administered 2013-10-08 – 2013-10-12 (×5): 0.4 mg via ORAL
  Filled 2013-10-08 (×7): qty 1

## 2013-10-08 MED ORDER — PANTOPRAZOLE SODIUM 40 MG PO TBEC
40.0000 mg | DELAYED_RELEASE_TABLET | Freq: Every day | ORAL | Status: DC
  Administered 2013-10-08 – 2013-10-13 (×6): 40 mg via ORAL
  Filled 2013-10-08 (×6): qty 1

## 2013-10-08 MED ORDER — ALBUTEROL SULFATE HFA 108 (90 BASE) MCG/ACT IN AERS
2.0000 | INHALATION_SPRAY | Freq: Four times a day (QID) | RESPIRATORY_TRACT | Status: DC | PRN
Start: 2013-10-08 — End: 2013-10-08

## 2013-10-08 MED ORDER — ASPIRIN EC 81 MG PO TBEC
81.0000 mg | DELAYED_RELEASE_TABLET | Freq: Every day | ORAL | Status: DC
  Administered 2013-10-08 – 2013-10-13 (×6): 81 mg via ORAL
  Filled 2013-10-08 (×7): qty 1

## 2013-10-08 MED ORDER — ACETAMINOPHEN 325 MG PO TABS: 650.0000 mg | ORAL_TABLET | Freq: Four times a day (QID) | ORAL | Status: DC | PRN

## 2013-10-08 MED ORDER — OXYCODONE-ACETAMINOPHEN 5-325 MG PO TABS
1.0000 | ORAL_TABLET | ORAL | Status: DC | PRN
  Administered 2013-10-08 – 2013-10-10 (×6): 2 via ORAL
  Administered 2013-10-10: 1 via ORAL
  Administered 2013-10-10 – 2013-10-11 (×2): 2 via ORAL
  Filled 2013-10-08: qty 1
  Filled 2013-10-08 (×8): qty 2

## 2013-10-08 MED ORDER — OMEGA-3-ACID ETHYL ESTERS 1 G PO CAPS
2.0000 g | ORAL_CAPSULE | Freq: Every day | ORAL | Status: DC
  Administered 2013-10-08 – 2013-10-13 (×6): 2 g via ORAL
  Filled 2013-10-08 (×7): qty 2

## 2013-10-08 MED ORDER — VANCOMYCIN HCL IN DEXTROSE 750-5 MG/150ML-% IV SOLN
750.0000 mg | Freq: Two times a day (BID) | INTRAVENOUS | Status: DC
  Administered 2013-10-08 – 2013-10-10 (×4): 750 mg via INTRAVENOUS
  Filled 2013-10-08 (×5): qty 150

## 2013-10-08 MED ORDER — SODIUM CHLORIDE 0.9 % IV BOLUS (SEPSIS)
1000.0000 mL | Freq: Once | INTRAVENOUS | Status: AC
  Administered 2013-10-08: 1000 mL via INTRAVENOUS

## 2013-10-08 MED ORDER — OXYCODONE-ACETAMINOPHEN 5-325 MG PO TABS
1.0000 | ORAL_TABLET | Freq: Once | ORAL | Status: AC
  Administered 2013-10-08: 1 via ORAL
  Filled 2013-10-08: qty 1

## 2013-10-08 MED ORDER — MAGNESIUM HYDROXIDE 400 MG/5ML PO SUSP: 30.0000 mL | Freq: Every day | ORAL | Status: DC | PRN

## 2013-10-08 MED ORDER — IBUPROFEN 600 MG PO TABS
600.0000 mg | ORAL_TABLET | Freq: Four times a day (QID) | ORAL | Status: DC | PRN
  Administered 2013-10-08: 600 mg via ORAL
  Filled 2013-10-08 (×2): qty 1

## 2013-10-08 MED ORDER — MOMETASONE FURO-FORMOTEROL FUM 100-5 MCG/ACT IN AERO
2.0000 | INHALATION_SPRAY | Freq: Two times a day (BID) | RESPIRATORY_TRACT | Status: DC
  Administered 2013-10-09 – 2013-10-13 (×9): 2 via RESPIRATORY_TRACT
  Filled 2013-10-08: qty 8.8

## 2013-10-08 MED ORDER — ALBUTEROL SULFATE (2.5 MG/3ML) 0.083% IN NEBU
2.5000 mg | INHALATION_SOLUTION | Freq: Four times a day (QID) | RESPIRATORY_TRACT | Status: DC | PRN
  Administered 2013-10-09 (×2): 2.5 mg via RESPIRATORY_TRACT
  Filled 2013-10-08 (×2): qty 3

## 2013-10-08 MED ORDER — DOCUSATE SODIUM 100 MG PO CAPS
100.0000 mg | ORAL_CAPSULE | Freq: Two times a day (BID) | ORAL | Status: DC
  Administered 2013-10-08 – 2013-10-13 (×10): 100 mg via ORAL
  Filled 2013-10-08 (×12): qty 1

## 2013-10-08 MED ORDER — ONDANSETRON HCL 4 MG PO TABS
4.0000 mg | ORAL_TABLET | Freq: Four times a day (QID) | ORAL | Status: DC | PRN
Start: 2013-10-08 — End: 2013-10-09

## 2013-10-08 MED ORDER — ROSUVASTATIN CALCIUM 10 MG PO TABS
10.0000 mg | ORAL_TABLET | Freq: Every day | ORAL | Status: DC
  Administered 2013-10-08 – 2013-10-13 (×6): 10 mg via ORAL
  Filled 2013-10-08 (×6): qty 1

## 2013-10-08 NOTE — H&P (Signed)
[facility] History and Physical  BALTASAR TWILLEY CWC:[id number] DOB: October 14, 1931 DOA: 10/08/2013  Referring physician: EDP PCP: Horatio Pel, MD   Chief Complaint: foot pain  HPI: Raymond Morris is a 80 y.o. male  Presents with right foot pain, swelling and erythema.  Started near great toe and spread to ankle and dorsum of foot.  Seen in [facility] several days ago and started on prednisone for possible gout, as well as doxycycline for cellulitis.  Symptoms worsening, and complaining of severe pain currently. In ED, blood pressure dropped to 88 after IV fentanyl.  Better after hydration.  Xray foot shows swelling. No h/o gout.    Review of Systems:  Systems reviewed. As above, otherwise negative.  Past Medical History  Diagnosis Date  . Hyperlipidemia   . Hypertension   . COPD (chronic obstructive pulmonary disease)   . Depression   . Asthma   . GERD (gastroesophageal reflux disease)   . Hiatal hernia   . Phlebitis   . AAA (abdominal aortic aneurysm)   . Carotid artery occlusion    Past Surgical History  Procedure Laterality Date  . Appendectomy    . Umbilical hernia repair    . Hemorrhoid surgery    . Vasectomy     Social History:  reports that he quit smoking about 37 years ago. His smoking use included Cigarettes. He smoked 0.00 packs per day. He has never used smokeless tobacco. He reports that he does not drink alcohol or use illicit drugs.  Allergies  Allergen Reactions  . Ceclor [Cefaclor] Other (See Comments)    Causes muscle spasms  . Sulfa Antibiotics Rash    Family History  Problem Relation Age of Onset  . Cancer Mother   . Cancer Father   . Stroke Sister   . Liver disease Sister      Prior to Admission medications   Medication Sig Start Date End Date Taking? Authorizing Provider  acetaminophen (TYLENOL) 500 MG tablet Take 1,500 mg by mouth every 6 (six) hours as needed for mild pain.   Yes Historical Provider, MD    acetaminophen-codeine (TYLENOL #3) 300-30 MG per tablet Take 1 tablet by mouth every 4 (four) hours as needed for moderate pain.   Yes Historical Provider, MD  ADVAIR DISKUS 100-50 MCG/DOSE AEPB Inhale 1 puff into the lungs daily as needed (shortness of breath).  10/21/12  Yes Historical Provider, MD  albuterol (PROAIR HFA) 108 (90 BASE) MCG/ACT inhaler Inhale 2 puffs into the lungs every 6 (six) hours as needed for wheezing or shortness of breath.   Yes Historical Provider, MD  aspirin EC 81 MG tablet Take 81 mg by mouth daily.     Yes Historical Provider, MD  cetirizine (ZYRTEC) 10 MG chewable tablet Chew 10 mg by mouth daily.     Yes Historical Provider, MD  doxycycline (VIBRA-TABS) 100 MG tablet Take 100 mg by mouth daily.    Yes Historical Provider, MD  escitalopram (LEXAPRO) 20 MG tablet Take 10 mg by mouth daily.     Yes Historical Provider, MD  esomeprazole (NEXIUM) 20 MG capsule Take 20 mg by mouth daily before breakfast.    Yes Historical Provider, MD  hydrochlorothiazide (HYDRODIURIL) 25 MG tablet Take 25 mg by mouth daily.   Yes Historical Provider, MD  ibuprofen (ADVIL,MOTRIN) 200 MG tablet Take 600 mg by mouth every 6 (six) hours as needed for moderate pain.   Yes Historical Provider, MD  nebivolol (BYSTOLIC) 10 MG tablet  Take 5 mg by mouth daily.   Yes Historical Provider, MD  omega-3 acid ethyl esters (LOVAZA) 1 G capsule Take 2 g by mouth daily.     Yes Historical Provider, MD  predniSONE (DELTASONE) 10 MG tablet Take 10 mg by mouth See admin instructions. Take 6 tablets on day one, 5 tablets the next day, 4 tablets the next day, 3 tablets the next day, 2 tablets the next day and 1 tablet on the last day   Yes Historical Provider, MD  rosuvastatin (CRESTOR) 20 MG tablet Take 10 mg by mouth daily.    Yes Historical Provider, MD  Tamsulosin HCl (FLOMAX) 0.4 MG CAPS Take 0.4 mg by mouth daily.     Yes Historical Provider, MD   Physical Exam: Filed Vitals:   10/08/13 1117 10/08/13  1309 10/08/13 1311 10/08/13 1313  BP: 123/52 107/70  107/70  Pulse: 57 53  54  Temp:      TempSrc:      Resp: 24  17 12   Height:      Weight:      SpO2: 97% 97%  96%    Wt Readings from Last 3 Encounters:  10/08/13 111.131 kg (245 lb)  11/04/12 111.585 kg (246 lb)  10/08/12 111.585 kg (246 lb)   BP 107/60  Pulse 56  Temp(Src) 98 F (36.7 C) (Oral)  Resp 16  Ht 5\' 9"  (1.753 m)  Wt 111.585 kg (246 lb)  BMI 36.31 kg/m2  SpO2 97%  General Appearance:    Alert, cooperative, nontoxic. Appears uncomfortable  Head:    Normocephalic, without obvious abnormality, atraumatic  Eyes:    PERRL, conjunctiva/corneas clear, EOM's intact     Nose:   Nares normal, septum midline, mucosa normal, no drainage   or sinus tenderness  Throat:   Lips, mucosa, and tongue normal; teeth and gums normal  Neck:   Supple, symmetrical, trachea midline, no adenopathy;       thyroid:  No enlargement/tenderness/nodules; no carotid   bruit or JVD  Back:     Symmetric, no curvature, ROM normal, no CVA tenderness  Lungs:     Clear to auscultation bilaterally, respirations unlabored  Chest wall:    No tenderness or deformity  Heart:    Regular rate and rhythm, S1 and S2 normal, no murmur, rub   or gallop  Abdomen:     Soft, non-tender, bowel sounds active all four quadrants,    no masses, no organomegaly  Genitalia:    deferred  Rectal:    deferred  Extremities:   Right foot warm, edematous, tender, erythematous  Pulses:   2+ and symmetric all extremities  Skin:   Skin color, texture, turgor normal, no rashes or lesions  Lymph nodes:   Cervical, supraclavicular, and axillary nodes normal  Neurologic:   CNII-XII intact. Normal strength, sensation and reflexes      throughout    Psych: normal affect           Labs on Admission:  Basic Metabolic Panel:  Recent Labs Lab 10/08/13 0459 10/08/13 0526  NA 140 142  K 2.8* 3.1*  CL 98 100  CO2  --  30  GLUCOSE 101* 99  BUN 21 21  CREATININE 1.10  1.00  CALCIUM  --  8.5   Liver Function Tests: No results found for this basename: AST, ALT, ALKPHOS, BILITOT, PROT, ALBUMIN,  in the last 168 hours No results found for this basename: LIPASE, AMYLASE,  in the last 168  hours No results found for this basename: AMMONIA,  in the last 168 hours CBC:  Recent Labs Lab 10/08/13 0459 10/08/13 0526  WBC  --  12.0*  NEUTROABS  --  8.4*  HGB 13.3 12.7*  HCT 39.0 37.8*  MCV  --  95.2  PLT  --  242   Cardiac Enzymes: No results found for this basename: CKTOTAL, CKMB, CKMBINDEX, TROPONINI,  in the last 168 hours  BNP (last 3 results) No results found for this basename: PROBNP,  in the last 8760 hours CBG: No results found for this basename: GLUCAP,  in the last 168 hours  Radiological Exams on Admission: Dg Foot Complete Right  10/08/2013   CLINICAL DATA:  Acute onset of right foot swelling and erythema for 3 days. Initial encounter.  EXAM: RIGHT FOOT COMPLETE - 3+ VIEW  COMPARISON:  None.  FINDINGS: There is no evidence of fracture or dislocation. Mild degenerative change is noted at the distal aspect of the navicular. There is no evidence of talar subluxation; the subtalar joint is unremarkable in appearance. Small plantar and posterior calcaneal spurs are seen.  Diffuse soft tissue swelling is noted about the foot.  IMPRESSION: No evidence of fracture or dislocation.   Electronically Signed   By: Garald Balding M.D.   On: 10/08/2013 03:31    Assessment/Plan Principal Problem:   Cellulitis of foot, right: failed outpatient doxycycline. Will give IV vancomycin and PO cipro. Pain control elevation. Doubt gout. D/c prednisone. Active Problems:   Hypokalemia: replete by mouth   Essential hypertension, benign: pressure dropped after IV fentanyl. Hold antihyertensives for now. Doubt sepsis.   Hyperlipidemia   GERD (gastroesophageal reflux disease)  Code Status: full Family Communication: multiple at bedside Disposition Plan: home  Time  spent: 70 min  [facility] Pager [phone]

## 2013-10-08 NOTE — Progress Notes (Signed)
ANTIBIOTIC CONSULT NOTE - INITIAL  Pharmacy Consult for Vancomycin Indication: cellulitis  Allergies  Allergen Reactions  . Ceclor [Cefaclor] Other (See Comments)    Causes muscle spasms  . Sulfa Antibiotics Rash    Patient Measurements: Height: 5\' 9"  (175.3 cm) Weight: 245 lb (111.131 kg) IBW/kg (Calculated) : 70.7 Adjusted Body Weight: 90 kg  Vital Signs: Temp: 98.6 F (37 C) (10/07 0254) Temp Source: Oral (10/07 0254) BP: 106/64 mmHg (10/07 0738) Pulse Rate: 52 (10/07 0738) Intake/Output from previous day:   Intake/Output from this shift:    Labs:  Recent Labs  10/08/13 0459 10/08/13 0526  WBC  --  12.0*  HGB 13.3 12.7*  PLT  --  242  CREATININE 1.10 1.00   Estimated Creatinine Clearance: 73.6 ml/min (by C-G formula based on Cr of 1). No results found for this basename: VANCOTROUGH, VANCOPEAK, VANCORANDOM, GENTTROUGH, GENTPEAK, GENTRANDOM, TOBRATROUGH, TOBRAPEAK, TOBRARND, AMIKACINPEAK, AMIKACINTROU, AMIKACIN,  in the last 72 hours   Microbiology: No results found for this or any previous visit (from the past 720 hour(s)).  Medical History: Past Medical History  Diagnosis Date  . Hyperlipidemia   . Hypertension   . COPD (chronic obstructive pulmonary disease)   . Depression   . Asthma   . GERD (gastroesophageal reflux disease)   . Hiatal hernia   . Phlebitis   . AAA (abdominal aortic aneurysm)   . Carotid artery occlusion   . Gout     Medications:  APAP  Advair  Albuterol  ASA  Zyrtec  Doxycycline  Lexapro  Nexium  HCTZ  Ibuprofen  Bystolic  Lovaza  Prednisone  Crestor  FLomax  Assessment: 80 yo male with RLE cellulitis for empiric antibiotics  Vancomycin 1.5 g IV given in ED at  0630  Goal of Therapy:  Vancomycin trough level 10-15 mcg/ml  Plan:  Vancomycin 750 mg IV q12h  Caryl Pina 10/08/2013,7:44 AM

## 2013-10-08 NOTE — ED Provider Notes (Signed)
TIME SEEN: 5:05 AM  CHIEF COMPLAINT: Right foot, redness subjective fevers and chills  HPI: Patient is a 80 year old male with history of hypertension, hyperlipidemia, COPD, AAA who presents to the emergency department with complaints of progressive right foot swelling, redness and warmth and pain. He states that he started noticing redness, pain and swelling at the right first toe on Thursday, 5 days ago. He reports that on Saturday he went to eat well in the walk in [facility]. He was diagnosed with gout and started on prednisone and doxycycline. He reports that his redness, warmth and pain spread of his foot. He did have a subjective fever yesterday and chills. No nausea, vomiting or diarrhea. Denies any recent injury. No numbness or focal weakness. He is not a diabetic or immunocompromised.  ROS: See HPI Constitutional: no fever  Eyes: no drainage  ENT: no runny nose   Cardiovascular:  no chest pain  Resp: no SOB  GI: no vomiting GU: no dysuria Integumentary: no rash  Allergy: no hives  Musculoskeletal: no leg swelling  Neurological: no slurred speech ROS otherwise negative  PAST MEDICAL HISTORY/PAST SURGICAL HISTORY:  Past Medical History  Diagnosis Date  . Hyperlipidemia   . Hypertension   . COPD (chronic obstructive pulmonary disease)   . Depression   . Asthma   . GERD (gastroesophageal reflux disease)   . Hiatal hernia   . Phlebitis   . AAA (abdominal aortic aneurysm)   . Carotid artery occlusion   . Gout     MEDICATIONS:  Prior to Admission medications   Medication Sig Start Date End Date Taking? Authorizing Provider  ADVAIR DISKUS 100-50 MCG/DOSE AEPB as needed. 10/21/12   Historical Provider, MD  aspirin EC 81 MG tablet Take 81 mg by mouth daily.      Historical Provider, MD  cetirizine (ZYRTEC) 10 MG chewable tablet Chew 10 mg by mouth daily.      Historical Provider, MD  escitalopram (LEXAPRO) 20 MG tablet Take 10 mg by mouth daily.      Historical Provider,  MD  esomeprazole (NEXIUM) 20 MG capsule Take 40 mg by mouth daily before breakfast.      Historical Provider, MD  hydrochlorothiazide (HYDRODIURIL) 25 MG tablet Take 25 mg by mouth daily.    Historical Provider, MD  Nebivolol HCl (BYSTOLIC) 20 MG TABS Take by mouth. Take 1/4 tablet daily     Historical Provider, MD  omega-3 acid ethyl esters (LOVAZA) 1 G capsule Take 2 g by mouth daily.      Historical Provider, MD  rosuvastatin (CRESTOR) 20 MG tablet Take by mouth. Take 1/2 tab every other day    Historical Provider, MD  Tamsulosin HCl (FLOMAX) 0.4 MG CAPS Take 0.4 mg by mouth daily.      Historical Provider, MD    ALLERGIES:  Allergies  Allergen Reactions  . Ceclor [Cefaclor] Other (See Comments)    Causes muscle spasms  . Sulfa Antibiotics Rash    SOCIAL HISTORY:  History  Substance Use Topics  . Smoking status: Former Smoker    Types: Cigarettes    Quit date: 09/07/1976  . Smokeless tobacco: Never Used  . Alcohol Use: No    FAMILY HISTORY: Family History  Problem Relation Age of Onset  . Cancer Mother   . Cancer Father   . Stroke Sister   . Liver disease Sister     EXAM: BP 117/54  Pulse 55  Temp(Src) 98.6 F (37 C) (Oral)  Resp 24  Ht 5\' 9"  (1.753 m)  Wt 245 lb (111.131 kg)  BMI 36.16 kg/m2  SpO2 96% CONSTITUTIONAL: Alert and oriented and responds appropriately to questions. Well-appearing; well-nourished HEAD: Normocephalic EYES: Conjunctivae clear, PERRL ENT: normal nose; no rhinorrhea; moist mucous membranes; pharynx without lesions noted NECK: Supple, no meningismus, no LAD  CARD: RRR; S1 and S2 appreciated; no murmurs, no clicks, no rubs, no gallops RESP: Normal chest excursion without splinting or tachypnea; breath sounds clear and equal bilaterally; no wheezes, no rhonchi, no rales,  ABD/GI: Normal bowel sounds; non-distended; soft, non-tender, no rebound, no guarding BACK:  The back appears normal and is non-tender to palpation, there is no CVA  tenderness EXT: Right dorsal foot has erythema and warmth and some mild pitting edema, 2+ DP pulses bilaterally, no joint effusion, full range of motion in all joints is painless, no calf tenderness or swelling, compartments are soft, sensation to light touch intact diffusely, no bony tenderness or bony deformity, Normal ROM in all joints; otherwise extremities are non-tender to palpation; no edema; normal capillary refill; no cyanosis    SKIN: Normal color for age and race; warm NEURO: Moves all extremities equally PSYCH: The patient's mood and manner are appropriate. Grooming and personal hygiene are appropriate.  MEDICAL DECISION MAKING: Patient here with what appears to be cellulitis. There is no joint effusion to suggest septic arthritis or gout currently. He reports he has had subjective fevers at home but is hemodynamically stable in the ED. We'll check basic labs and give IV pain medications and antibiotics. Have discussed with patient I recommend admission given he has been on doxycycline and at his cellulitis continues to spread. At this time, patient would like discharge home. We'll discuss again when labs are back. X-ray shows no acute injury or sign of osteomyelitis.  ED PROGRESS: Patient has a leukocytosis with left shift. This may be secondary to recent prednisone use but also to worsening cellulitis. Given he has been on oral antibiotics and cellulitis has got worse, I recommended admission for IV antibiotics which he agrees to now. We'll discuss with hospitalist for admission. His PCP is Dr. Deland Pretty with [facility].   Discussed with Dr. Conley Canal for admission to medical bed, inpatient. Patient did have a slight drop in his blood pressure is receiving fentanyl but this is improving with IV fluids.  [place], DO 10/08/13 [phone]

## 2013-10-08 NOTE — ED Notes (Signed)
Patient eating meal tray

## 2013-10-08 NOTE — ED Notes (Signed)
Pt. reports progressing gout pain at right foot onset last week , currently taking oral Prednisone and antibiotic with no relief.

## 2013-10-08 NOTE — ED Notes (Signed)
Dr. Arville Go aware of hypotension; asymptomatic; orders received.

## 2013-10-09 LAB — BASIC METABOLIC PANEL
ANION GAP: 9 (ref 5–15)
BUN: 19 mg/dL (ref 6–23)
CHLORIDE: 102 meq/L (ref 96–112)
CO2: 30 meq/L (ref 19–32)
CREATININE: 1.14 mg/dL (ref 0.50–1.35)
Calcium: 8.6 mg/dL (ref 8.4–10.5)
GFR calc Af Amer: 69 mL/min — ABNORMAL LOW (ref >90)
GFR calc non Af Amer: 59 mL/min — ABNORMAL LOW (ref >90)
Glucose, Bld: 93 mg/dL (ref 70–99)
POTASSIUM: 3.4 meq/L — AB (ref 3.7–5.3)
Sodium: 141 mEq/L (ref 137–147)

## 2013-10-09 LAB — MAGNESIUM: MAGNESIUM: 1.6 mg/dL (ref 1.5–2.5)

## 2013-10-09 LAB — URIC ACID: Uric Acid, Serum: 6.8 mg/dL (ref 4.0–7.8)

## 2013-10-09 MED ORDER — POTASSIUM CHLORIDE CRYS ER 20 MEQ PO TBCR
40.0000 meq | EXTENDED_RELEASE_TABLET | Freq: Once | ORAL | Status: AC
  Administered 2013-10-09: 40 meq via ORAL
  Filled 2013-10-09: qty 2

## 2013-10-09 NOTE — Progress Notes (Signed)
Patient Demographics  Raymond Morris, is a 80 y.o. male, DOB - 23-Mar-1931, EXN:[id number]  Admit date - 10/08/2013   Admitting Physician Delfina Redwood, MD  Outpatient Primary MD for the patient is Horatio Pel, MD  LOS - 1   Chief Complaint  Patient presents with  . Gout        Subjective:   Raymond Morris today has, No headache, No chest pain, No abdominal pain - No Nausea, No new weakness tingling or numbness, No Cough - SOB. Ongoing right foot pain.  Assessment & Plan    1. Cellulitis of foot, right - likely due to bug bite, x-ray stable, uric acid stable, continue empiric antibiotic and monitor. Requested patient to keep leg elevated. Has remote history of DVT will check venous duplex.   2. Hypokalemia. Replace and recheck in the morning with magnesium levels.   3. Essential hypertension. Currently stable on no medications.   4. Dyslipidemia. Continue home dose statin.   5. GERD. On PPI.     6. BPH. Continue Flomax.     Code Status: Full  Family Communication: Family member  Disposition Plan: home   Procedures    Consults      Medications  Scheduled Meds: . aspirin EC  81 mg Oral Daily  . ciprofloxacin  500 mg Oral BID  . docusate sodium  100 mg Oral BID  . enoxaparin (LOVENOX) injection  40 mg Subcutaneous Q24H  . escitalopram  10 mg Oral Daily  . loratadine  10 mg Oral Daily  . mometasone-formoterol  2 puff Inhalation BID  . omega-3 acid ethyl esters  2 g Oral Daily  . pantoprazole  40 mg Oral Daily  . potassium chloride  40 mEq Oral Once  . rosuvastatin  10 mg Oral Daily  . tamsulosin  0.4 mg Oral Daily  . vancomycin  750 mg Intravenous Q12H   Continuous Infusions:  PRN Meds:.acetaminophen, albuterol, alum & mag hydroxide-simeth,  ibuprofen, magnesium hydroxide, ondansetron (ZOFRAN) IV, oxyCODONE-acetaminophen  DVT Prophylaxis  Lovenox    Lab Results  Component Value Date   PLT 242 10/08/2013    Antibiotics     Anti-infectives   Start     Dose/Rate Route Frequency Ordered Stop   10/08/13 1800  vancomycin (VANCOCIN) IVPB 750 mg/150 ml premix     750 mg 150 mL/hr over 60 Minutes Intravenous Every 12 hours 10/08/13 0748     10/08/13 1415  ciprofloxacin (CIPRO) tablet 500 mg     500 mg Oral 2 times daily 10/08/13 1408     10/08/13 0530  vancomycin (VANCOCIN) 1,500 mg in sodium chloride 0.9 % 500 mL IVPB     1,500 mg 250 mL/hr over 120 Minutes Intravenous  Once 10/08/13 0517 10/08/13 1313          Objective:   Filed Vitals:   10/08/13 1621 10/08/13 2300 10/09/13 0217 10/09/13 0516  BP: 107/60 107/69 111/68 118/50  Pulse: 56 53 54 58  Temp: 98 F (36.7 C) 97.6 F (36.4 C) 98 F (36.7 C) 97.9 F (36.6 C)  TempSrc: Oral Oral Oral Oral  Resp: 16 17 18 19   Height: 5\' 9"  (1.753 m)     Weight: 111.585 kg (246 lb)  SpO2: 97% 95% 94% 94%    Wt Readings from Last 3 Encounters:  10/08/13 111.585 kg (246 lb)  11/04/12 111.585 kg (246 lb)  10/08/12 111.585 kg (246 lb)     Intake/Output Summary (Last 24 hours) at 10/09/13 1113 Last data filed at 10/09/13 1027  Gross per 24 hour  Intake    700 ml  Output      0 ml  Net    700 ml     Physical Exam  Awake Alert, Oriented X 3, No new F.N deficits, Normal affect [place].AT,PERRAL Supple Neck,No JVD, No cervical lymphadenopathy appriciated.  Symmetrical Chest wall movement, Good air movement bilaterally, CTAB RRR,No Gallops,Rubs or new Murmurs, No Parasternal Heave +ve B.Sounds, Abd Soft, No tenderness, No organomegaly appriciated, No rebound - guarding or rigidity. No Cyanosis, Clubbing or edema, No new Rash or bruise , right foot mildly swollen with some erythema   Data Review   Micro Results No results found for this or any previous visit  (from the past 240 hour(s)).  Radiology Reports Dg Foot Complete Right  10/08/2013   CLINICAL DATA:  Acute onset of right foot swelling and erythema for 3 days. Initial encounter.  EXAM: RIGHT FOOT COMPLETE - 3+ VIEW  COMPARISON:  None.  FINDINGS: There is no evidence of fracture or dislocation. Mild degenerative change is noted at the distal aspect of the navicular. There is no evidence of talar subluxation; the subtalar joint is unremarkable in appearance. Small plantar and posterior calcaneal spurs are seen.  Diffuse soft tissue swelling is noted about the foot.  IMPRESSION: No evidence of fracture or dislocation.   Electronically Signed   By: Garald Balding M.D.   On: 10/08/2013 03:31     CBC  Recent Labs Lab 10/08/13 0459 10/08/13 0526  WBC  --  12.0*  HGB 13.3 12.7*  HCT 39.0 37.8*  PLT  --  242  MCV  --  95.2  MCH  --  32.0  MCHC  --  33.6  RDW  --  13.8  LYMPHSABS  --  2.2  MONOABS  --  1.2*  EOSABS  --  0.2  BASOSABS  --  0.0    Chemistries   Recent Labs Lab 10/08/13 0459 10/08/13 0526 10/09/13 0435  NA 140 142 141  K 2.8* 3.1* 3.4*  CL 98 100 102  CO2  --  30 30  GLUCOSE 101* 99 93  BUN 21 21 19   CREATININE 1.10 1.00 1.14  CALCIUM  --  8.5 8.6  MG  --   --  1.6   ------------------------------------------------------------------------------------------------------------------ estimated creatinine clearance is 64.7 ml/min (by C-G formula based on Cr of 1.14). ------------------------------------------------------------------------------------------------------------------ No results found for this basename: HGBA1C,  in the last 72 hours ------------------------------------------------------------------------------------------------------------------ No results found for this basename: CHOL, HDL, LDLCALC, TRIG, CHOLHDL, LDLDIRECT,  in the last 72  hours ------------------------------------------------------------------------------------------------------------------ No results found for this basename: TSH, T4TOTAL, FREET3, T3FREE, THYROIDAB,  in the last 72 hours ------------------------------------------------------------------------------------------------------------------ No results found for this basename: VITAMINB12, FOLATE, FERRITIN, TIBC, IRON, RETICCTPCT,  in the last 72 hours  Coagulation profile No results found for this basename: INR, PROTIME,  in the last 168 hours  No results found for this basename: DDIMER,  in the last 72 hours  Cardiac Enzymes No results found for this basename: CK, CKMB, TROPONINI, MYOGLOBIN,  in the last 168 hours ------------------------------------------------------------------------------------------------------------------ No components found with this basename: POCBNP,      Time Spent in minutes 35  Thurnell Lose M.D on 10/09/2013 at 11:13 AM  Between 7am to 7pm - Pager - [phone]  After 7pm go to [email or web address] - password [email or web address]  And look for the night coverage person covering for me after hours  [facility] Office  [phone]

## 2013-10-10 DIAGNOSIS — R600 Localized edema: Secondary | ICD-10-CM

## 2013-10-10 LAB — BASIC METABOLIC PANEL
ANION GAP: 11 (ref 5–15)
BUN: 13 mg/dL (ref 6–23)
CALCIUM: 8.6 mg/dL (ref 8.4–10.5)
CO2: 30 mEq/L (ref 19–32)
Chloride: 101 mEq/L (ref 96–112)
Creatinine, Ser: 1.09 mg/dL (ref 0.50–1.35)
GFR calc Af Amer: 73 mL/min — ABNORMAL LOW (ref >90)
GFR calc non Af Amer: 63 mL/min — ABNORMAL LOW (ref >90)
GLUCOSE: 100 mg/dL — AB (ref 70–99)
POTASSIUM: 3.7 meq/L (ref 3.7–5.3)
SODIUM: 142 meq/L (ref 137–147)

## 2013-10-10 LAB — CBC
HCT: 36.3 % — ABNORMAL LOW (ref 39.0–52.0)
Hemoglobin: 12.2 g/dL — ABNORMAL LOW (ref 13.0–17.0)
MCH: 31 pg (ref 26.0–34.0)
MCHC: 33.6 g/dL (ref 30.0–36.0)
MCV: 92.4 fL (ref 78.0–100.0)
PLATELETS: 210 10*3/uL (ref 150–400)
RBC: 3.93 MIL/uL — AB (ref 4.22–5.81)
RDW: 13.8 % (ref 11.5–15.5)
WBC: 6.7 10*3/uL (ref 4.0–10.5)

## 2013-10-10 LAB — MAGNESIUM: Magnesium: 1.8 mg/dL (ref 1.5–2.5)

## 2013-10-10 MED ORDER — VANCOMYCIN HCL IN DEXTROSE 1-5 GM/200ML-% IV SOLN
1000.0000 mg | Freq: Two times a day (BID) | INTRAVENOUS | Status: DC
  Administered 2013-10-10: 1000 mg via INTRAVENOUS
  Filled 2013-10-10 (×2): qty 200

## 2013-10-10 MED ORDER — VANCOMYCIN HCL IN DEXTROSE 1-5 GM/200ML-% IV SOLN
1000.0000 mg | Freq: Two times a day (BID) | INTRAVENOUS | Status: DC
  Administered 2013-10-10 – 2013-10-11 (×3): 1000 mg via INTRAVENOUS
  Filled 2013-10-10 (×5): qty 200

## 2013-10-10 NOTE — Progress Notes (Signed)
Patient Demographics  Raymond Morris, is a 80 y.o. male, DOB - Mar 16, 1931, OZD:[id number]  Admit date - 10/08/2013   Admitting Physician Delfina Redwood, MD  Outpatient Primary MD for the patient is Horatio Pel, MD  LOS - 2   Chief Complaint  Patient presents with  . Gout        Subjective:   [name] today has, No headache, No chest pain, No abdominal pain - No Nausea, No new weakness tingling or numbness, No Cough - SOB. Improved right foot pain.  Assessment & Plan    1. Cellulitis of foot, right - likely due to bug bite, x-ray stable, uric acid stable, continue empiric antibiotic and monitor. Requested patient to keep leg elevated. Has remote history of DVT will check venous duplex which is still pending.   2. Hypokalemia. Replace and recheck in the morning with magnesium levels.   3. Essential hypertension. Currently stable on no medications.   4. Dyslipidemia. Continue home dose statin.   5. GERD. On PPI.     6. BPH. Continue Flomax.     Code Status: Full  Family Communication: Family member  Disposition Plan: home   Procedures    Consults      Medications  Scheduled Meds: . aspirin EC  81 mg Oral Daily  . ciprofloxacin  500 mg Oral BID  . docusate sodium  100 mg Oral BID  . enoxaparin (LOVENOX) injection  40 mg Subcutaneous Q24H  . escitalopram  10 mg Oral Daily  . loratadine  10 mg Oral Daily  . mometasone-formoterol  2 puff Inhalation BID  . omega-3 acid ethyl esters  2 g Oral Daily  . pantoprazole  40 mg Oral Daily  . rosuvastatin  10 mg Oral Daily  . tamsulosin  0.4 mg Oral Daily  . vancomycin  750 mg Intravenous Q12H   Continuous Infusions:  PRN Meds:.acetaminophen, albuterol, alum & mag hydroxide-simeth, ibuprofen, magnesium  hydroxide, ondansetron (ZOFRAN) IV, oxyCODONE-acetaminophen  DVT Prophylaxis  Lovenox    Lab Results  Component Value Date   PLT 210 10/10/2013    Antibiotics     Anti-infectives   Start     Dose/Rate Route Frequency Ordered Stop   10/08/13 1800  vancomycin (VANCOCIN) IVPB 750 mg/150 ml premix     750 mg 150 mL/hr over 60 Minutes Intravenous Every 12 hours 10/08/13 0748     10/08/13 1415  ciprofloxacin (CIPRO) tablet 500 mg     500 mg Oral 2 times daily 10/08/13 1408     10/08/13 0530  vancomycin (VANCOCIN) 1,500 mg in sodium chloride 0.9 % 500 mL IVPB     1,500 mg 250 mL/hr over 120 Minutes Intravenous  Once 10/08/13 0517 10/08/13 1313          Objective:   Filed Vitals:   10/09/13 2234 10/10/13 0132 10/10/13 0627 10/10/13 0916  BP: 120/60 115/64 106/57   Pulse: 68 63 57   Temp: 98 F (36.7 C) 98.3 F (36.8 C) 97.8 F (36.6 C)   TempSrc: Oral Oral Oral   Resp: 17 16 17    Height:      Weight:      SpO2: 97% 96% 97% 95%    Wt Readings from Last  3 Encounters:  10/08/13 111.585 kg (246 lb)  11/04/12 111.585 kg (246 lb)  10/08/12 111.585 kg (246 lb)     Intake/Output Summary (Last 24 hours) at 10/10/13 0924 Last data filed at 10/10/13 0516  Gross per 24 hour  Intake   1290 ml  Output      0 ml  Net   1290 ml     Physical Exam  Awake Alert, Oriented X 3, No new F.N deficits, Normal affect [place].AT,PERRAL Supple Neck,No JVD, No cervical lymphadenopathy appriciated.  Symmetrical Chest wall movement, Good air movement bilaterally, CTAB RRR,No Gallops,Rubs or new Murmurs, No Parasternal Heave +ve B.Sounds, Abd Soft, No tenderness, No organomegaly appriciated, No rebound - guarding or rigidity. No Cyanosis, Clubbing or edema, No new Rash or bruise , right foot mildly swollen with some erythema   Data Review   Micro Results No results found for this or any previous visit (from the past 240 hour(s)).  Radiology Reports Dg Foot Complete Right  10/08/2013    CLINICAL DATA:  Acute onset of right foot swelling and erythema for 3 days. Initial encounter.  EXAM: RIGHT FOOT COMPLETE - 3+ VIEW  COMPARISON:  None.  FINDINGS: There is no evidence of fracture or dislocation. Mild degenerative change is noted at the distal aspect of the navicular. There is no evidence of talar subluxation; the subtalar joint is unremarkable in appearance. Small plantar and posterior calcaneal spurs are seen.  Diffuse soft tissue swelling is noted about the foot.  IMPRESSION: No evidence of fracture or dislocation.   Electronically Signed   By: Garald Balding M.D.   On: 10/08/2013 03:31     CBC  Recent Labs Lab 10/08/13 0459 10/08/13 0526 10/10/13 0500  WBC  --  12.0* 6.7  HGB 13.3 12.7* 12.2*  HCT 39.0 37.8* 36.3*  PLT  --  242 210  MCV  --  95.2 92.4  MCH  --  32.0 31.0  MCHC  --  33.6 33.6  RDW  --  13.8 13.8  LYMPHSABS  --  2.2  --   MONOABS  --  1.2*  --   EOSABS  --  0.2  --   BASOSABS  --  0.0  --     Chemistries   Recent Labs Lab 10/08/13 0459 10/08/13 0526 10/09/13 0435 10/10/13 0500  NA 140 142 141 142  K 2.8* 3.1* 3.4* 3.7  CL 98 100 102 101  CO2  --  30 30 30   GLUCOSE 101* 99 93 100*  BUN 21 21 19 13   CREATININE 1.10 1.00 1.14 1.09  CALCIUM  --  8.5 8.6 8.6  MG  --   --  1.6 1.8   ------------------------------------------------------------------------------------------------------------------ estimated creatinine clearance is 67.7 ml/min (by C-G formula based on Cr of 1.09). ------------------------------------------------------------------------------------------------------------------ No results found for this basename: HGBA1C,  in the last 72 hours ------------------------------------------------------------------------------------------------------------------ No results found for this basename: CHOL, HDL, LDLCALC, TRIG, CHOLHDL, LDLDIRECT,  in the last 72  hours ------------------------------------------------------------------------------------------------------------------ No results found for this basename: TSH, T4TOTAL, FREET3, T3FREE, THYROIDAB,  in the last 72 hours ------------------------------------------------------------------------------------------------------------------ No results found for this basename: VITAMINB12, FOLATE, FERRITIN, TIBC, IRON, RETICCTPCT,  in the last 72 hours  Coagulation profile No results found for this basename: INR, PROTIME,  in the last 168 hours  No results found for this basename: DDIMER,  in the last 72 hours  Cardiac Enzymes No results found for this basename: CK, CKMB, TROPONINI, MYOGLOBIN,  in the last 168  hours ------------------------------------------------------------------------------------------------------------------ No components found with this basename: POCBNP,      Time Spent in minutes 35   Cameshia Cressman K M.D on 10/10/2013 at 9:24 AM  Between 7am to 7pm - Pager - [phone]  After 7pm go to [email or web address] - password [email or web address]  And look for the night coverage person covering for me after hours  [facility] Office  [phone]

## 2013-10-10 NOTE — Progress Notes (Signed)
ANTIBIOTIC CONSULT NOTE - Follow Up  Pharmacy Consult for Vancomycin Indication: cellulitis  Allergies  Allergen Reactions  . Ceclor [Cefaclor] Other (See Comments)    Causes muscle spasms  . Sulfa Antibiotics Rash    Patient Measurements: Height: 5\' 9"  (175.3 cm) Weight: 246 lb (111.585 kg) IBW/kg (Calculated) : 70.7 Adjusted Body Weight: 90 kg  Vital Signs: Temp: 97.7 F (36.5 C) (10/09 0934) Temp Source: Oral (10/09 0934) BP: 116/67 mmHg (10/09 0934) Pulse Rate: 67 (10/09 0934) Intake/Output from previous day: 10/08 0701 - 10/09 0700 In: 1290 [P.O.:840; IV Piggyback:450] Out: -  Intake/Output from this shift: Total I/O In: 240 [P.O.:240] Out: -   Labs:  Recent Labs  10/08/13 0459 10/08/13 0526 10/09/13 0435 10/10/13 0500  WBC  --  12.0*  --  6.7  HGB 13.3 12.7*  --  12.2*  PLT  --  242  --  210  CREATININE 1.10 1.00 1.14 1.09   Estimated Creatinine Clearance: 67.7 ml/min (by C-G formula based on Cr of 1.09). No results found for this basename: VANCOTROUGH, VANCOPEAK, VANCORANDOM, GENTTROUGH, GENTPEAK, GENTRANDOM, TOBRATROUGH, TOBRAPEAK, TOBRARND, AMIKACINPEAK, AMIKACINTROU, AMIKACIN,  in the last 72 hours   Microbiology: No results found for this or any previous visit (from the past 720 hour(s)).  Medical History: Past Medical History  Diagnosis Date  . Hyperlipidemia   . Hypertension   . COPD (chronic obstructive pulmonary disease)   . Depression   . Asthma   . GERD (gastroesophageal reflux disease)   . Hiatal hernia   . Phlebitis   . AAA (abdominal aortic aneurysm)   . Carotid artery occlusion   . Cellulitis of right foot 10/2013  . Upper respiratory infection     "I've had lots"  . Chronic bronchitis     "just about q yr"  . Stroke 2002    "didn't know I'd had one; just told me about it in 2015"  . Arthritis     "thumbs; hands" (10/08/2013)  . Chronic lower back pain   . Anxiety   . Pneumonia "9 times"    (10/08/2013)    Medications:   APAP  Advair  Albuterol  ASA  Zyrtec  Doxycycline  Lexapro  Nexium  HCTZ  Ibuprofen  Bystolic  Lovaza  Prednisone  Crestor  FLomax  Assessment: 80 yo male with RLE cellulitis for empiric antibiotics. Currently on Vancomycin 750 mg IV Q 12 hours. Wt > 100 kg. CrCl ~ 65-70 mL/min.   Goal of Therapy:  Vancomycin trough level 10-15 mcg/ml  Plan:  -Given patient's weight, increase Vancomycin dose to 1 gm IV Q 12 hours  -Continue Ciprofloxacin 500 mg IV Q 12 hours  -F/u VT at [facility], PharmD.  Clinical Pharmacist Pager [phone]

## 2013-10-11 MED ORDER — ENOXAPARIN SODIUM 60 MG/0.6ML ~~LOC~~ SOLN
55.0000 mg | SUBCUTANEOUS | Status: DC
  Administered 2013-10-11 – 2013-10-12 (×2): 55 mg via SUBCUTANEOUS
  Filled 2013-10-11 (×3): qty 0.6

## 2013-10-11 MED ORDER — METHYLPREDNISOLONE SODIUM SUCC 125 MG IJ SOLR
60.0000 mg | Freq: Once | INTRAMUSCULAR | Status: AC
  Administered 2013-10-11: 60 mg via INTRAVENOUS
  Filled 2013-10-11: qty 0.96

## 2013-10-11 MED ORDER — METHYLPREDNISOLONE SODIUM SUCC 40 MG IJ SOLR
40.0000 mg | Freq: Two times a day (BID) | INTRAMUSCULAR | Status: DC
  Administered 2013-10-11 – 2013-10-13 (×4): 40 mg via INTRAVENOUS
  Filled 2013-10-11 (×6): qty 1

## 2013-10-11 MED ORDER — COLCHICINE 0.6 MG PO TABS
0.6000 mg | ORAL_TABLET | Freq: Two times a day (BID) | ORAL | Status: DC
  Administered 2013-10-11 – 2013-10-13 (×4): 0.6 mg via ORAL
  Filled 2013-10-11 (×5): qty 1

## 2013-10-11 NOTE — Progress Notes (Signed)
Patient Demographics  Raymond Morris, is a 80 y.o. male, DOB - 01/29/1931, EHU:[id number]  Admit date - 10/08/2013   Admitting Physician Delfina Redwood, MD  Outpatient Primary MD for the patient is Horatio Pel, MD  LOS - 3   Chief Complaint  Patient presents with  . Gout        Subjective:   Raymond Morris today has, No headache, No chest pain, No abdominal pain - No Nausea, No new weakness tingling or numbness, No Cough - SOB. Improved but still +ve right foot pain.  Assessment & Plan    1. Cellulitis of foot, right - likely due to bug bite, x-ray stable, uric acid stable, lower extremity venous duplex negative, is clinically minimally improved on IV antibiotics, raises the suspicion of gout flare despite normal uric acid levels. We'll give him one shot of IV steroid and monitor.   2. Hypokalemia. Replace and stable.   3. Essential hypertension. Currently stable on no medications.   4. Dyslipidemia. Continue home dose statin.   5. GERD. On PPI.     6. BPH. Continue Flomax.     Code Status: Full  Family Communication: Family member  Disposition Plan: home   Procedures    Consults      Medications  Scheduled Meds: . aspirin EC  81 mg Oral Daily  . ciprofloxacin  500 mg Oral BID  . docusate sodium  100 mg Oral BID  . enoxaparin (LOVENOX) injection  40 mg Subcutaneous Q24H  . escitalopram  10 mg Oral Daily  . loratadine  10 mg Oral Daily  . mometasone-formoterol  2 puff Inhalation BID  . omega-3 acid ethyl esters  2 g Oral Daily  . pantoprazole  40 mg Oral Daily  . rosuvastatin  10 mg Oral Daily  . tamsulosin  0.4 mg Oral Daily  . vancomycin  1,000 mg Intravenous Q12H   Continuous Infusions:  PRN Meds:.acetaminophen, albuterol, alum & mag  hydroxide-simeth, ibuprofen, magnesium hydroxide, ondansetron (ZOFRAN) IV, oxyCODONE-acetaminophen  DVT Prophylaxis  Lovenox    Lab Results  Component Value Date   PLT 210 10/10/2013    Antibiotics     Anti-infectives   Start     Dose/Rate Route Frequency Ordered Stop   10/10/13 2000  vancomycin (VANCOCIN) IVPB 1000 mg/200 mL premix     1,000 mg 200 mL/hr over 60 Minutes Intravenous Every 12 hours 10/10/13 1939     10/10/13 1800  vancomycin (VANCOCIN) IVPB 1000 mg/200 mL premix  Status:  Discontinued     1,000 mg 200 mL/hr over 60 Minutes Intravenous Every 12 hours 10/10/13 1001 10/10/13 1939   10/08/13 1800  vancomycin (VANCOCIN) IVPB 750 mg/150 ml premix  Status:  Discontinued     750 mg 150 mL/hr over 60 Minutes Intravenous Every 12 hours 10/08/13 0748 10/10/13 1001   10/08/13 1415  ciprofloxacin (CIPRO) tablet 500 mg     500 mg Oral 2 times daily 10/08/13 1408     10/08/13 0530  vancomycin (VANCOCIN) 1,500 mg in sodium chloride 0.9 % 500 mL IVPB     1,500 mg 250 mL/hr over 120 Minutes Intravenous  Once 10/08/13 0517 10/08/13 1313          Objective:  Filed Vitals:   10/11/13 0216 10/11/13 0636 10/11/13 0902 10/11/13 0958  BP: 131/55 134/58  145/75  Pulse: 62 63  71  Temp: 97.6 F (36.4 C) 98 F (36.7 C)  98.4 F (36.9 C)  TempSrc: Oral Oral  Oral  Resp: 17 18  17   Height:      Weight:      SpO2: 99% 99% 99% 98%    Wt Readings from Last 3 Encounters:  10/08/13 111.585 kg (246 lb)  11/04/12 111.585 kg (246 lb)  10/08/12 111.585 kg (246 lb)     Intake/Output Summary (Last 24 hours) at 10/11/13 1024 Last data filed at 10/11/13 0600  Gross per 24 hour  Intake    577 ml  Output      0 ml  Net    577 ml     Physical Exam  Awake Alert, Oriented X 3, No new F.N deficits, Normal affect [email or web address].AT,PERRAL Supple Neck,No JVD, No cervical lymphadenopathy appriciated.  Symmetrical Chest wall movement, Good air movement bilaterally, CTAB RRR,No Gallops,Rubs or  new Murmurs, No Parasternal Heave +ve B.Sounds, Abd Soft, No tenderness, No organomegaly appriciated, No rebound - guarding or rigidity. No Cyanosis, Clubbing or edema, No new Rash or bruise , right foot mildly swollen with some erythema   Data Review   Micro Results No results found for this or any previous visit (from the past 240 hour(s)).  Radiology Reports Dg Foot Complete Right  10/08/2013   CLINICAL DATA:  Acute onset of right foot swelling and erythema for 3 days. Initial encounter.  EXAM: RIGHT FOOT COMPLETE - 3+ VIEW  COMPARISON:  None.  FINDINGS: There is no evidence of fracture or dislocation. Mild degenerative change is noted at the distal aspect of the navicular. There is no evidence of talar subluxation; the subtalar joint is unremarkable in appearance. Small plantar and posterior calcaneal spurs are seen.  Diffuse soft tissue swelling is noted about the foot.  IMPRESSION: No evidence of fracture or dislocation.   Electronically Signed   By: Garald Balding M.D.   On: 10/08/2013 03:31     CBC  Recent Labs Lab 10/08/13 0459 10/08/13 0526 10/10/13 0500  WBC  --  12.0* 6.7  HGB 13.3 12.7* 12.2*  HCT 39.0 37.8* 36.3*  PLT  --  242 210  MCV  --  95.2 92.4  MCH  --  32.0 31.0  MCHC  --  33.6 33.6  RDW  --  13.8 13.8  LYMPHSABS  --  2.2  --   MONOABS  --  1.2*  --   EOSABS  --  0.2  --   BASOSABS  --  0.0  --     Chemistries   Recent Labs Lab 10/08/13 0459 10/08/13 0526 10/09/13 0435 10/10/13 0500  NA 140 142 141 142  K 2.8* 3.1* 3.4* 3.7  CL 98 100 102 101  CO2  --  30 30 30   GLUCOSE 101* 99 93 100*  BUN 21 21 19 13   CREATININE 1.10 1.00 1.14 1.09  CALCIUM  --  8.5 8.6 8.6  MG  --   --  1.6 1.8   ------------------------------------------------------------------------------------------------------------------ estimated creatinine clearance is 67.7 ml/min (by C-G formula based on Cr of  1.09). ------------------------------------------------------------------------------------------------------------------ No results found for this basename: HGBA1C,  in the last 72 hours ------------------------------------------------------------------------------------------------------------------ No results found for this basename: CHOL, HDL, LDLCALC, TRIG, CHOLHDL, LDLDIRECT,  in the last 72 hours ------------------------------------------------------------------------------------------------------------------ No results found for this basename: TSH,  T4TOTAL, FREET3, T3FREE, THYROIDAB,  in the last 72 hours ------------------------------------------------------------------------------------------------------------------ No results found for this basename: VITAMINB12, FOLATE, FERRITIN, TIBC, IRON, RETICCTPCT,  in the last 72 hours  Coagulation profile No results found for this basename: INR, PROTIME,  in the last 168 hours  No results found for this basename: DDIMER,  in the last 72 hours  Cardiac Enzymes No results found for this basename: CK, CKMB, TROPONINI, MYOGLOBIN,  in the last 168 hours ------------------------------------------------------------------------------------------------------------------ No components found with this basename: POCBNP,      Time Spent in minutes 35   SINGH,PRASHANT K M.D on 10/11/2013 at 10:24 AM  Between 7am to 7pm - Pager - [phone]  After 7pm go to [email or web address] - password [email or web address]  And look for the night coverage person covering for me after hours  [facility] Office  [phone]

## 2013-10-12 MED ORDER — DOXYCYCLINE HYCLATE 100 MG PO TABS
100.0000 mg | ORAL_TABLET | Freq: Two times a day (BID) | ORAL | Status: DC
  Administered 2013-10-12 – 2013-10-13 (×3): 100 mg via ORAL
  Filled 2013-10-12 (×4): qty 1

## 2013-10-12 MED ORDER — METHYLPREDNISOLONE SODIUM SUCC 125 MG IJ SOLR: 60.0000 mg | Freq: Once | INTRAMUSCULAR | Status: DC

## 2013-10-12 NOTE — Progress Notes (Signed)
Patient Demographics  Raymond Morris, is a 80 y.o. male, DOB - [birth year], LGX:[id number]  Admit date - 10/08/2013   Admitting Physician Delfina Redwood, MD  Outpatient Primary MD for the patient is Horatio Pel, MD  LOS - 4   Chief Complaint  Patient presents with  . Gout        Subjective:   Raymond Morris today has, No headache, No chest pain, No abdominal pain - No Nausea, No new weakness tingling or numbness, No Cough - SOB. Improved and only minimal right foot pain.  Assessment & Plan    1. Right foot pain. Initially suspicious for cellulitis, however minimal improvement with antibiotics, x-ray stable, clinically he had acute gout with normal uric acid levels. Has responded very well to IV steroids which will be continued, likely discharge home tomorrow on oral steroid taper. Switch antibiotics to oral Augmentin only for a few more days.   2. Hypokalemia. Replace and stable.   3. Essential hypertension. Currently stable on no medications.   4. Dyslipidemia. Continue home dose statin.   5. GERD. On PPI.     6. BPH. Continue Flomax.     Code Status: Full  Family Communication: Family member  Disposition Plan: home   Procedures    Consults      Medications  Scheduled Meds: . aspirin EC  81 mg Oral Daily  . colchicine  0.6 mg Oral BID  . docusate sodium  100 mg Oral BID  . doxycycline  100 mg Oral Q12H  . enoxaparin (LOVENOX) injection  55 mg Subcutaneous Q24H  . escitalopram  10 mg Oral Daily  . loratadine  10 mg Oral Daily  . methylPREDNISolone (SOLU-MEDROL) injection  40 mg Intravenous Q12H  . mometasone-formoterol  2 puff Inhalation BID  . omega-3 acid ethyl esters  2 g Oral Daily  . pantoprazole  40 mg Oral Daily  . rosuvastatin  10 mg Oral  Daily  . tamsulosin  0.4 mg Oral Daily   Continuous Infusions:  PRN Meds:.acetaminophen, albuterol, alum & mag hydroxide-simeth, magnesium hydroxide, ondansetron (ZOFRAN) IV, oxyCODONE-acetaminophen  DVT Prophylaxis  Lovenox    Lab Results  Component Value Date   PLT 210 10/10/2013    Antibiotics     Anti-infectives   Start     Dose/Rate Route Frequency Ordered Stop   10/12/13 1000  doxycycline (VIBRA-TABS) tablet 100 mg     100 mg Oral Every 12 hours 10/12/13 0757     10/10/13 2000  vancomycin (VANCOCIN) IVPB 1000 mg/200 mL premix  Status:  Discontinued     1,000 mg 200 mL/hr over 60 Minutes Intravenous Every 12 hours 10/10/13 1939 10/12/13 0757   10/10/13 1800  vancomycin (VANCOCIN) IVPB 1000 mg/200 mL premix  Status:  Discontinued     1,000 mg 200 mL/hr over 60 Minutes Intravenous Every 12 hours 10/10/13 1001 10/10/13 1939   10/08/13 1800  vancomycin (VANCOCIN) IVPB 750 mg/150 ml premix  Status:  Discontinued     750 mg 150 mL/hr over 60 Minutes Intravenous Every 12 hours 10/08/13 0748 10/10/13 1001   10/08/13 1415  ciprofloxacin (CIPRO) tablet 500 mg  Status:  Discontinued     500 mg Oral 2 times daily 10/08/13 1408 10/12/13 0757  10/08/13 0530  vancomycin (VANCOCIN) 1,500 mg in sodium chloride 0.9 % 500 mL IVPB     1,500 mg 250 mL/hr over 120 Minutes Intravenous  Once 10/08/13 0517 10/08/13 1313          Objective:   Filed Vitals:   10/11/13 2206 10/12/13 0243 10/12/13 0537 10/12/13 0715  BP: 157/75 152/73 127/76   Pulse: 67 70 64 65  Temp: 97.5 F (36.4 C) 98.1 F (36.7 C) 97.6 F (36.4 C)   TempSrc: Oral Oral Oral   Resp: 17 16 17 18   Height:      Weight:      SpO2: 97% 98% 97% 96%    Wt Readings from Last 3 Encounters:  10/08/13 111.585 kg (246 lb)  11/04/12 111.585 kg (246 lb)  10/08/12 111.585 kg (246 lb)     Intake/Output Summary (Last 24 hours) at 10/12/13 0858 Last data filed at 10/12/13 0539  Gross per 24 hour  Intake    660 ml  Output       1 ml  Net    659 ml     Physical Exam  Awake Alert, Oriented X 3, No new F.N deficits, Normal affect [name],PERRAL Supple Neck,No JVD, No cervical lymphadenopathy appriciated.  Symmetrical Chest wall movement, Good air movement bilaterally, CTAB RRR,No Gallops,Rubs or new Murmurs, No Parasternal Heave +ve B.Sounds, Abd Soft, No tenderness, No organomegaly appriciated, No rebound - guarding or rigidity. No Cyanosis, Clubbing or edema, No new Rash or bruise , right foot mildly swollen with some erythema but much improved   Data Review   Micro Results No results found for this or any previous visit (from the past 240 hour(s)).  Radiology Reports Dg Foot Complete Right  10/08/2013   CLINICAL DATA:  Acute onset of right foot swelling and erythema for 3 days. Initial encounter.  EXAM: RIGHT FOOT COMPLETE - 3+ VIEW  COMPARISON:  None.  FINDINGS: There is no evidence of fracture or dislocation. Mild degenerative change is noted at the distal aspect of the navicular. There is no evidence of talar subluxation; the subtalar joint is unremarkable in appearance. Small plantar and posterior calcaneal spurs are seen.  Diffuse soft tissue swelling is noted about the foot.  IMPRESSION: No evidence of fracture or dislocation.   Electronically Signed   By: Garald Balding M.D.   On: 10/08/2013 03:31     CBC  Recent Labs Lab 10/08/13 0459 10/08/13 0526 10/10/13 0500  WBC  --  12.0* 6.7  HGB 13.3 12.7* 12.2*  HCT 39.0 37.8* 36.3*  PLT  --  242 210  MCV  --  95.2 92.4  MCH  --  32.0 31.0  MCHC  --  33.6 33.6  RDW  --  13.8 13.8  LYMPHSABS  --  2.2  --   MONOABS  --  1.2*  --   EOSABS  --  0.2  --   BASOSABS  --  0.0  --     Chemistries   Recent Labs Lab 10/08/13 0459 10/08/13 0526 10/09/13 0435 10/10/13 0500  NA 140 142 141 142  K 2.8* 3.1* 3.4* 3.7  CL 98 100 102 101  CO2  --  30 30 30   GLUCOSE 101* 99 93 100*  BUN 21 21 19 13   CREATININE 1.10 1.00 1.14 1.09  CALCIUM  --   8.5 8.6 8.6  MG  --   --  1.6 1.8   ------------------------------------------------------------------------------------------------------------------ estimated creatinine clearance is 67.7 ml/min (by  C-G formula based on Cr of 1.09). ------------------------------------------------------------------------------------------------------------------ No results found for this basename: HGBA1C,  in the last 72 hours ------------------------------------------------------------------------------------------------------------------ No results found for this basename: CHOL, HDL, LDLCALC, TRIG, CHOLHDL, LDLDIRECT,  in the last 72 hours ------------------------------------------------------------------------------------------------------------------ No results found for this basename: TSH, T4TOTAL, FREET3, T3FREE, THYROIDAB,  in the last 72 hours ------------------------------------------------------------------------------------------------------------------ No results found for this basename: VITAMINB12, FOLATE, FERRITIN, TIBC, IRON, RETICCTPCT,  in the last 72 hours  Coagulation profile No results found for this basename: INR, PROTIME,  in the last 168 hours  No results found for this basename: DDIMER,  in the last 72 hours  Cardiac Enzymes No results found for this basename: CK, CKMB, TROPONINI, MYOGLOBIN,  in the last 168 hours ------------------------------------------------------------------------------------------------------------------ No components found with this basename: POCBNP,      Time Spent in minutes 35   Malli Falotico K M.D on 10/12/2013 at 8:58 AM  Between 7am to 7pm - Pager - [phone]  After 7pm go to [email or web address] - password [email or web address]  And look for the night coverage person covering for me after hours  [facility] Office  [phone]

## 2013-10-13 MED ORDER — DOXYCYCLINE HYCLATE 100 MG PO TABS: 100.0000 mg | ORAL_TABLET | Freq: Every day | ORAL | Status: DC

## 2013-10-13 MED ORDER — HYDROCODONE-ACETAMINOPHEN 5-325 MG PO TABS: 1.0000 | ORAL_TABLET | Freq: Four times a day (QID) | ORAL | Status: DC | PRN

## 2013-10-13 MED ORDER — COLCHICINE 0.6 MG PO TABS: 0.6000 mg | ORAL_TABLET | Freq: Two times a day (BID) | ORAL | Status: DC

## 2013-10-13 MED ORDER — PREDNISONE 5 MG PO TABS: ORAL_TABLET | ORAL | Status: DC

## 2013-10-13 NOTE — Discharge Summary (Signed)
Raymond Morris, is a 80 y.o. male  DOB 04/17/1931  MRN [id number].  Admission date:  10/08/2013  Admitting Physician  Delfina Redwood, MD  Discharge Date:  10/13/2013   Primary MD  Horatio Pel, MD  Recommendations for primary care physician for things to follow:   Monitor clinically. Has gout.   Admission Diagnosis  Cellulitis of right foot [L03.115]   Discharge Diagnosis  Cellulitis of right foot [L03.115]    Principal Problem:   Cellulitis of foot, right Active Problems:   Hypokalemia   Essential hypertension, benign   Hyperlipidemia   GERD (gastroesophageal reflux disease)      Past Medical History  Diagnosis Date  . Hyperlipidemia   . Hypertension   . COPD (chronic obstructive pulmonary disease)   . Depression   . Asthma   . GERD (gastroesophageal reflux disease)   . Hiatal hernia   . Phlebitis   . AAA (abdominal aortic aneurysm)   . Carotid artery occlusion   . Cellulitis of right foot 10/2013  . Upper respiratory infection     "I've had lots"  . Chronic bronchitis     "just about q yr"  . Stroke 2002    "didn't know I'd had one; just told me about it in 2015"  . Arthritis     "thumbs; hands" (10/08/2013)  . Chronic lower back pain   . Anxiety   . Pneumonia "9 times"    (10/08/2013)    Past Surgical History  Procedure Laterality Date  . Appendectomy    . Umbilical hernia repair    . Excisional hemorrhoidectomy    . Vasectomy    . Hernia repair         History of present illness and  Hospital Course:     Kindly see H&P for history of present illness and admission details, please review complete Labs, Consult reports and Test reports for all details in brief  HPI  from the history and physical done on the day of admission   Raymond Morris is a 80 y.o. male  Presents with  right foot pain, swelling and erythema. Started near great toe and spread to ankle and dorsum of foot. Seen in [facility] several days ago and started on prednisone for possible gout, as well as doxycycline for cellulitis. Symptoms worsening, and complaining of severe pain currently. In ED, blood pressure dropped to 88 after IV fentanyl. Better after hydration. Xray foot shows swelling. No h/o gout.     Hospital Course    1. Right foot pain. Initially suspicious for cellulitis, however minimal improvement with antibiotics, x-ray stable, clinically he had acute gout with normal uric acid levels. Has responded very well to IV steroids which will be continued, discharged home on steroid taper finish 2 more days of doxycycline.  2. Hypokalemia. Replace and stable.   3. Essential hypertension. Currently stable on no medications.   4. Dyslipidemia. Continue home dose statin.   5. GERD. On PPI.   6. BPH. Continue  Flomax.       Discharge Condition: stable   Follow UP  Follow-up Information   Follow up with Horatio Pel, MD. Schedule an appointment as soon as possible for a visit in 1 week.   Specialty:  Internal Medicine   Contact information:   [address] [place] [address] [phone]         Discharge Instructions  and  Discharge Medications      Discharge Instructions   Discharge instructions    Complete by:  As directed   Follow with Primary MD Horatio Pel, MD in 7 days   Get CBC, CMP, 2 view Chest X ray checked  by Primary MD next visit.    Activity: As tolerated with Full fall precautions use walker/cane & assistance as needed   Disposition Home    Diet: Heart Healthy  Low carb.  For Heart failure patients - Check your Weight same time everyday, if you gain over 2 pounds, or you develop in leg swelling, experience more shortness of breath or chest pain, call your Primary MD immediately. Follow Cardiac Low  Salt Diet and 1.8 lit/day fluid restriction.   On your next visit with her primary care physician please Get Medicines reviewed and adjusted.  Please request your Prim.MD to go over all Hospital Tests and Procedure/Radiological results at the follow up, please get all Hospital records sent to your Prim MD by signing hospital release before you go home.   If you experience worsening of your admission symptoms, develop shortness of breath, life threatening emergency, suicidal or homicidal thoughts you must seek medical attention immediately by calling 911 or calling your MD immediately  if symptoms less severe.  You Must read complete instructions/literature along with all the possible adverse reactions/side effects for all the Medicines you take and that have been prescribed to you. Take any new Medicines after you have completely understood and accpet all the possible adverse reactions/side effects.   Do not drive, operating heavy machinery, perform activities at heights, swimming or participation in water activities or provide baby sitting services if your were admitted for syncope or siezures until you have seen by Primary MD or a Neurologist and advised to do so again.  Do not drive when taking Pain medications.    Do not take more than prescribed Pain, Sleep and Anxiety Medications  Special Instructions: If you have smoked or chewed Tobacco  in the last 2 yrs please stop smoking, stop any regular Alcohol  and or any Recreational drug use.  Wear Seat belts while driving.   Please note  You were cared for by a hospitalist during your hospital stay. If you have any questions about your discharge medications or the care you received while you were in the hospital after you are discharged, you can call the unit and asked to speak with the hospitalist on call if the hospitalist that took care of you is not available. Once you are discharged, your primary care physician will handle any further  medical issues. Please note that NO REFILLS for any discharge medications will be authorized once you are discharged, as it is imperative that you return to your primary care physician (or establish a relationship with a primary care physician if you do not have one) for your aftercare needs so that they can reassess your need for medications and monitor your lab values.   Gout Gout is an inflammatory arthritis caused by a buildup of uric acid crystals in the  joints. Uric acid is a chemical that is normally present in the blood. When the level of uric acid in the blood is too high it can form crystals that deposit in your joints and tissues. This causes joint redness, soreness, and swelling (inflammation). Repeat attacks are common. Over time, uric acid crystals can form into masses (tophi) near a joint, destroying bone and causing disfigurement. Gout is treatable and often preventable. CAUSES  The disease begins with elevated levels of uric acid in the blood. Uric acid is produced by your body when it breaks down a naturally found substance called purines. Certain foods you eat, such as meats and fish, contain high amounts of purines. Causes of an elevated uric acid level include: Being passed down from parent to child (heredity). Diseases that cause increased uric acid production (such as obesity, psoriasis, and certain cancers). Excessive alcohol use. Diet, especially diets rich in meat and seafood. Medicines, including certain cancer-fighting medicines (chemotherapy), water pills (diuretics), and aspirin. Chronic kidney disease. The kidneys are no longer able to remove uric acid well. Problems with metabolism. Conditions strongly associated with gout include: Obesity. High blood pressure. High cholesterol. Diabetes. Not everyone with elevated uric acid levels gets gout. It is not understood why some people get gout and others do not. Surgery, joint injury, and eating too much of certain foods  are some of the factors that can lead to gout attacks. SYMPTOMS  An attack of gout comes on quickly. It causes intense pain with redness, swelling, and warmth in a joint. Fever can occur. Often, only one joint is involved. Certain joints are more commonly involved: Base of the big toe. Knee. Ankle. Wrist. Finger. Without treatment, an attack usually goes away in a few days to weeks. Between attacks, you usually will not have symptoms, which is different from many other forms of arthritis. DIAGNOSIS  Your caregiver will suspect gout based on your symptoms and exam. In some cases, tests may be recommended. The tests may include: Blood tests. Urine tests. X-rays. Joint fluid exam. This exam requires a needle to remove fluid from the joint (arthrocentesis). Using a microscope, gout is confirmed when uric acid crystals are seen in the joint fluid. TREATMENT  There are two phases to gout treatment: treating the sudden onset (acute) attack and preventing attacks (prophylaxis). Treatment of an Acute Attack. Medicines are used. These include anti-inflammatory medicines or steroid medicines. An injection of steroid medicine into the affected joint is sometimes necessary. The painful joint is rested. Movement can worsen the arthritis. You may use warm or cold treatments on painful joints, depending which works best for you. Treatment to Prevent Attacks. If you suffer from frequent gout attacks, your caregiver may advise preventive medicine. These medicines are started after the acute attack subsides. These medicines either help your kidneys eliminate uric acid from your body or decrease your uric acid production. You may need to stay on these medicines for a very long time. The early phase of treatment with preventive medicine can be associated with an increase in acute gout attacks. For this reason, during the first few months of treatment, your caregiver may also advise you to take medicines usually  used for acute gout treatment. Be sure you understand your caregiver's directions. Your caregiver may make several adjustments to your medicine dose before these medicines are effective. Discuss dietary treatment with your caregiver or dietitian. Alcohol and drinks high in sugar and fructose and foods such as meat, poultry, and seafood can increase uric  acid levels. Your caregiver or dietitian can advise you on drinks and foods that should be limited. HOME CARE INSTRUCTIONS  Do not take aspirin to relieve pain. This raises uric acid levels. Only take over-the-counter or prescription medicines for pain, discomfort, or fever as directed by your caregiver. Rest the joint as much as possible. When in bed, keep sheets and blankets off painful areas. Keep the affected joint raised (elevated). Apply warm or cold treatments to painful joints. Use of warm or cold treatments depends on which works best for you. Use crutches if the painful joint is in your leg. Drink enough fluids to keep your urine clear or pale yellow. This helps your body get rid of uric acid. Limit alcohol, sugary drinks, and fructose drinks. Follow your dietary instructions. Pay careful attention to the amount of protein you eat. Your daily diet should emphasize fruits, vegetables, whole grains, and fat-free or low-fat milk products. Discuss the use of coffee, vitamin C, and cherries with your caregiver or dietitian. These may be helpful in lowering uric acid levels. Maintain a healthy body weight. SEEK MEDICAL CARE IF:  You develop diarrhea, vomiting, or any side effects from medicines. You do not feel better in 24 hours, or you are getting worse. SEEK IMMEDIATE MEDICAL CARE IF:  Your joint becomes suddenly more tender, and you have chills or a fever. MAKE SURE YOU:  Understand these instructions. Will watch your condition. Will get help right away if you are not doing well or get worse. Document Released: 12/17/1999 Document  Revised: 05/05/2013 Document Reviewed: 08/02/2011 [facility] Patient Information 2015 [place], [place]. This information is not intended to replace advice given to you by your health care provider. Make sure you discuss any questions you have with your health care provider.     Increase activity slowly    Complete by:  As directed             Medication List    STOP taking these medications       acetaminophen-codeine 300-30 MG per tablet  Commonly known as:  TYLENOL #3     ibuprofen 200 MG tablet  Commonly known as:  ADVIL,MOTRIN     predniSONE 10 MG tablet  Commonly known as:  DELTASONE  Replaced by:  predniSONE 5 MG tablet      TAKE these medications       acetaminophen 500 MG tablet  Commonly known as:  TYLENOL  Take 1,500 mg by mouth every 6 (six) hours as needed for mild pain.     ADVAIR DISKUS 100-50 MCG/DOSE Aepb  Generic drug:  Fluticasone-Salmeterol  Inhale 1 puff into the lungs daily as needed (shortness of breath).     aspirin EC 81 MG tablet  Take 81 mg by mouth daily.     cetirizine 10 MG chewable tablet  Commonly known as:  ZYRTEC  Chew 10 mg by mouth daily.     colchicine 0.6 MG tablet  Take 1 tablet (0.6 mg total) by mouth 2 (two) times daily.     doxycycline 100 MG tablet  Commonly known as:  VIBRA-TABS  Take 1 tablet (100 mg total) by mouth daily.     escitalopram 20 MG tablet  Commonly known as:  LEXAPRO  Take 10 mg by mouth daily.     esomeprazole 20 MG capsule  Commonly known as:  NEXIUM  Take 20 mg by mouth daily before breakfast.     hydrochlorothiazide 25 MG tablet  Commonly known as:  HYDRODIURIL  Take 25 mg by mouth daily.     HYDROcodone-acetaminophen 5-325 MG per tablet  Commonly known as:  NORCO/VICODIN  Take 1 tablet by mouth every 6 (six) hours as needed for moderate pain.     nebivolol 10 MG tablet  Commonly known as:  BYSTOLIC  Take 5 mg by mouth daily.     omega-3 acid ethyl esters 1 G capsule  Commonly known as:   LOVAZA  Take 2 g by mouth daily.     predniSONE 5 MG tablet  Commonly known as:  DELTASONE  Label  & dispense according to the schedule below. 10 Pills PO for 3 days then, 8 Pills PO for 3 days, 6 Pills PO for 3 days, 4 Pills PO for 3 days, 2 Pills PO for 3 days, 1 Pills PO for 3 days, 1/2 Pill  PO for 3 days then STOP. Total 95 pills.     PROAIR HFA 108 (90 BASE) MCG/ACT inhaler  Generic drug:  albuterol  Inhale 2 puffs into the lungs every 6 (six) hours as needed for wheezing or shortness of breath.     rosuvastatin 20 MG tablet  Commonly known as:  CRESTOR  Take 10 mg by mouth daily.     tamsulosin 0.4 MG Caps capsule  Commonly known as:  FLOMAX  Take 0.4 mg by mouth daily.          Diet and Activity recommendation: See Discharge Instructions above   Consults obtained - none   Major procedures and Radiology Reports - PLEASE review detailed and final reports for all details, in brief -       Dg Foot Complete Right  10/08/2013   CLINICAL DATA:  Acute onset of right foot swelling and erythema for 3 days. Initial encounter.  EXAM: RIGHT FOOT COMPLETE - 3+ VIEW  COMPARISON:  None.  FINDINGS: There is no evidence of fracture or dislocation. Mild degenerative change is noted at the distal aspect of the navicular. There is no evidence of talar subluxation; the subtalar joint is unremarkable in appearance. Small plantar and posterior calcaneal spurs are seen.  Diffuse soft tissue swelling is noted about the foot.  IMPRESSION: No evidence of fracture or dislocation.   Electronically Signed   By: Garald Balding M.D.   On: 10/08/2013 03:31    Micro Results      No results found for this or any previous visit (from the past 240 hour(s)).     Today   Subjective:   Raymond Morris today has no headache,no chest abdominal pain,no new weakness tingling or numbness, feels much better wants to go home today.    Objective:   Blood pressure 157/83, pulse 55, temperature 98.1 F  (36.7 C), temperature source Oral, resp. rate 18, height 5\' 9"  (1.753 m), weight 111.585 kg (246 lb), SpO2 91.00%.   Intake/Output Summary (Last 24 hours) at 10/13/13 0927 Last data filed at 10/12/13 1356  Gross per 24 hour  Intake    720 ml  Output      0 ml  Net    720 ml    Exam Awake Alert, Oriented x 3, No new F.N deficits, Normal affect [place].AT,PERRAL Supple Neck,No JVD, No cervical lymphadenopathy appriciated.  Symmetrical Chest wall movement, Good air movement bilaterally, CTAB RRR,No Gallops,Rubs or new Murmurs, No Parasternal Heave +ve B.Sounds, Abd Soft, Non tender, No organomegaly appriciated, No rebound -guarding or rigidity. No Cyanosis, Clubbing or edema, No new Rash or bruise  Data  Review   CBC w Diff: Lab Results  Component Value Date   WBC 6.7 10/10/2013   HGB 12.2* 10/10/2013   HCT 36.3* 10/10/2013   PLT 210 10/10/2013   LYMPHOPCT 18 10/08/2013   MONOPCT 10 10/08/2013   EOSPCT 2 10/08/2013   BASOPCT 0 10/08/2013    CMP: Lab Results  Component Value Date   NA 142 10/10/2013   K 3.7 10/10/2013   CL 101 10/10/2013   CO2 30 10/10/2013   BUN 13 10/10/2013   CREATININE 1.09 10/10/2013  .   Total Time in preparing paper work, data evaluation and todays exam - 35 minutes  Thurnell Lose M.D on 10/13/2013 at 9:27 AM  [facility] Office  [phone]

## 2013-10-13 NOTE — Discharge Instructions (Signed)
Follow with Primary MD Horatio Pel, MD in 7 days   Get CBC, CMP, 2 view Chest X ray checked  by Primary MD next visit.    Activity: As tolerated with Full fall precautions use walker/cane & assistance as needed   Disposition Home    Diet: Heart Healthy  Low carb.  For Heart failure patients - Check your Weight same time everyday, if you gain over 2 pounds, or you develop in leg swelling, experience more shortness of breath or chest pain, call your Primary MD immediately. Follow Cardiac Low Salt Diet and 1.8 lit/day fluid restriction.   On your next visit with her primary care physician please Get Medicines reviewed and adjusted.  Please request your Prim.MD to go over all Hospital Tests and Procedure/Radiological results at the follow up, please get all Hospital records sent to your Prim MD by signing hospital release before you go home.   If you experience worsening of your admission symptoms, develop shortness of breath, life threatening emergency, suicidal or homicidal thoughts you must seek medical attention immediately by calling 911 or calling your MD immediately  if symptoms less severe.  You Must read complete instructions/literature along with all the possible adverse reactions/side effects for all the Medicines you take and that have been prescribed to you. Take any new Medicines after you have completely understood and accpet all the possible adverse reactions/side effects.   Do not drive, operating heavy machinery, perform activities at heights, swimming or participation in water activities or provide baby sitting services if your were admitted for syncope or siezures until you have seen by Primary MD or a Neurologist and advised to do so again.  Do not drive when taking Pain medications.    Do not take more than prescribed Pain, Sleep and Anxiety Medications  Special Instructions: If you have smoked or chewed Tobacco  in the last 2 yrs please stop smoking, stop  any regular Alcohol  and or any Recreational drug use.  Wear Seat belts while driving.   Please note  You were cared for by a hospitalist during your hospital stay. If you have any questions about your discharge medications or the care you received while you were in the hospital after you are discharged, you can call the unit and asked to speak with the hospitalist on call if the hospitalist that took care of you is not available. Once you are discharged, your primary care physician will handle any further medical issues. Please note that NO REFILLS for any discharge medications will be authorized once you are discharged, as it is imperative that you return to your primary care physician (or establish a relationship with a primary care physician if you do not have one) for your aftercare needs so that they can reassess your need for medications and monitor your lab values.   Gout Gout is an inflammatory arthritis caused by a buildup of uric acid crystals in the joints. Uric acid is a chemical that is normally present in the blood. When the level of uric acid in the blood is too high it can form crystals that deposit in your joints and tissues. This causes joint redness, soreness, and swelling (inflammation). Repeat attacks are common. Over time, uric acid crystals can form into masses (tophi) near a joint, destroying bone and causing disfigurement. Gout is treatable and often preventable. CAUSES  The disease begins with elevated levels of uric acid in the blood. Uric acid is produced by your body when it breaks  down a naturally found substance called purines. Certain foods you eat, such as meats and fish, contain high amounts of purines. Causes of an elevated uric acid level include:  Being passed down from parent to child (heredity).  Diseases that cause increased uric acid production (such as obesity, psoriasis, and certain cancers).  Excessive alcohol use.  Diet, especially diets rich in meat  and seafood.  Medicines, including certain cancer-fighting medicines (chemotherapy), water pills (diuretics), and aspirin.  Chronic kidney disease. The kidneys are no longer able to remove uric acid well.  Problems with metabolism. Conditions strongly associated with gout include:  Obesity.  High blood pressure.  High cholesterol.  Diabetes. Not everyone with elevated uric acid levels gets gout. It is not understood why some people get gout and others do not. Surgery, joint injury, and eating too much of certain foods are some of the factors that can lead to gout attacks. SYMPTOMS   An attack of gout comes on quickly. It causes intense pain with redness, swelling, and warmth in a joint.  Fever can occur.  Often, only one joint is involved. Certain joints are more commonly involved:  Base of the big toe.  Knee.  Ankle.  Wrist.  Finger. Without treatment, an attack usually goes away in a few days to weeks. Between attacks, you usually will not have symptoms, which is different from many other forms of arthritis. DIAGNOSIS  Your caregiver will suspect gout based on your symptoms and exam. In some cases, tests may be recommended. The tests may include:  Blood tests.  Urine tests.  X-rays.  Joint fluid exam. This exam requires a needle to remove fluid from the joint (arthrocentesis). Using a microscope, gout is confirmed when uric acid crystals are seen in the joint fluid. TREATMENT  There are two phases to gout treatment: treating the sudden onset (acute) attack and preventing attacks (prophylaxis).  Treatment of an Acute Attack.  Medicines are used. These include anti-inflammatory medicines or steroid medicines.  An injection of steroid medicine into the affected joint is sometimes necessary.  The painful joint is rested. Movement can worsen the arthritis.  You may use warm or cold treatments on painful joints, depending which works best for you.  Treatment to  Prevent Attacks.  If you suffer from frequent gout attacks, your caregiver may advise preventive medicine. These medicines are started after the acute attack subsides. These medicines either help your kidneys eliminate uric acid from your body or decrease your uric acid production. You may need to stay on these medicines for a very long time.  The early phase of treatment with preventive medicine can be associated with an increase in acute gout attacks. For this reason, during the first few months of treatment, your caregiver may also advise you to take medicines usually used for acute gout treatment. Be sure you understand your caregiver's directions. Your caregiver may make several adjustments to your medicine dose before these medicines are effective.  Discuss dietary treatment with your caregiver or dietitian. Alcohol and drinks high in sugar and fructose and foods such as meat, poultry, and seafood can increase uric acid levels. Your caregiver or dietitian can advise you on drinks and foods that should be limited. HOME CARE INSTRUCTIONS   Do not take aspirin to relieve pain. This raises uric acid levels.  Only take over-the-counter or prescription medicines for pain, discomfort, or fever as directed by your caregiver.  Rest the joint as much as possible. When in bed, keep sheets  and blankets off painful areas.  Keep the affected joint raised (elevated).  Apply warm or cold treatments to painful joints. Use of warm or cold treatments depends on which works best for you.  Use crutches if the painful joint is in your leg.  Drink enough fluids to keep your urine clear or pale yellow. This helps your body get rid of uric acid. Limit alcohol, sugary drinks, and fructose drinks.  Follow your dietary instructions. Pay careful attention to the amount of protein you eat. Your daily diet should emphasize fruits, vegetables, whole grains, and fat-free or low-fat milk products. Discuss the use of  coffee, vitamin C, and cherries with your caregiver or dietitian. These may be helpful in lowering uric acid levels.  Maintain a healthy body weight. SEEK MEDICAL CARE IF:   You develop diarrhea, vomiting, or any side effects from medicines.  You do not feel better in 24 hours, or you are getting worse. SEEK IMMEDIATE MEDICAL CARE IF:   Your joint becomes suddenly more tender, and you have chills or a fever. MAKE SURE YOU:   Understand these instructions.  Will watch your condition.  Will get help right away if you are not doing well or get worse. Document Released: 12/17/1999 Document Revised: 05/05/2013 Document Reviewed: 08/02/2011 [facility] Patient Information 2015 [place], [place]. This information is not intended to replace advice given to you by your health care provider. Make sure you discuss any questions you have with your health care provider.

## 2013-10-13 NOTE — Progress Notes (Signed)
AVS discharge instructions were given and went over patient and his family. Patient was given prescriptions prednisone, doxicycline, hydrocodone, colchine to take to his pharmacy. Patient stated that he did not have any questions. Volunteers assisted patient to his transportation by wheelchair.

## 2013-10-13 NOTE — Progress Notes (Signed)
PT Cancellation Note  Patient Details Name: Raymond Morris MRN: [id number] DOB: [birth year]   Cancelled Treatment:    Reason Eval/Treat Not Completed: PT screened, no needs identified, will sign off. Reviewed use of RW for unweighting painful R LE when needed. Pt is up , dressed, and awaiting d/c from hospital.  Nurse reports pt has been up in room and walking in hallway with RW and is about to be d/c from hospital. D/c from [place] 10/13/2013, 11:12 AM

## 2013-10-13 NOTE — Care Management Note (Signed)
  Page 1 of 1   10/13/2013     11:53:24 AM CARE MANAGEMENT NOTE 10/13/2013  Patient:  Raymond Morris, Raymond Morris   Account Number:  [id number]  Date Initiated:  10/13/2013  Documentation initiated by:  Magdalen Spatz  Subjective/Objective Assessment:     Action/Plan:   Anticipated DC Date:  10/13/2013   Anticipated DC Plan:           Choice offered to / List presented to:             Status of service:   Medicare Important Message given?  YES (If response is "NO", the following Medicare IM given date fields will be blank) Date Medicare IM given:  10/13/2013 Medicare IM given by:  Magdalen Spatz Date Additional Medicare IM given:   Additional Medicare IM given by:    Discharge Disposition:    Per UR Regulation:    If discussed at Long Length of Stay Meetings, dates discussed:    Comments:

## 2014-01-13 DIAGNOSIS — M2550 Pain in unspecified joint: Secondary | ICD-10-CM | POA: Diagnosis not present

## 2014-01-13 DIAGNOSIS — M79673 Pain in unspecified foot: Secondary | ICD-10-CM | POA: Diagnosis not present

## 2014-01-19 ENCOUNTER — Emergency Department (HOSPITAL_COMMUNITY): Payer: Medicare HMO

## 2014-01-19 ENCOUNTER — Encounter (HOSPITAL_COMMUNITY): Payer: Self-pay | Admitting: *Deleted

## 2014-01-19 ENCOUNTER — Emergency Department (HOSPITAL_COMMUNITY)
Admission: EM | Admit: 2014-01-19 | Discharge: 2014-01-19 | Disposition: A | Discharge status: Home / Self Care | Payer: Medicare HMO | Attending: Emergency Medicine | Admitting: Emergency Medicine

## 2014-01-19 DIAGNOSIS — Z8701 Personal history of pneumonia (recurrent): Secondary | ICD-10-CM | POA: Insufficient documentation

## 2014-01-19 DIAGNOSIS — E785 Hyperlipidemia, unspecified: Secondary | ICD-10-CM | POA: Insufficient documentation

## 2014-01-19 DIAGNOSIS — Z8673 Personal history of transient ischemic attack (TIA), and cerebral infarction without residual deficits: Secondary | ICD-10-CM | POA: Insufficient documentation

## 2014-01-19 DIAGNOSIS — J441 Chronic obstructive pulmonary disease with (acute) exacerbation: Secondary | ICD-10-CM | POA: Insufficient documentation

## 2014-01-19 DIAGNOSIS — I10 Essential (primary) hypertension: Secondary | ICD-10-CM | POA: Insufficient documentation

## 2014-01-19 DIAGNOSIS — R51 Headache: Secondary | ICD-10-CM | POA: Insufficient documentation

## 2014-01-19 DIAGNOSIS — M1990 Unspecified osteoarthritis, unspecified site: Secondary | ICD-10-CM | POA: Diagnosis not present

## 2014-01-19 DIAGNOSIS — K219 Gastro-esophageal reflux disease without esophagitis: Secondary | ICD-10-CM | POA: Diagnosis not present

## 2014-01-19 DIAGNOSIS — Z79899 Other long term (current) drug therapy: Secondary | ICD-10-CM | POA: Diagnosis not present

## 2014-01-19 DIAGNOSIS — F329 Major depressive disorder, single episode, unspecified: Secondary | ICD-10-CM | POA: Diagnosis not present

## 2014-01-19 DIAGNOSIS — F419 Anxiety disorder, unspecified: Secondary | ICD-10-CM | POA: Diagnosis not present

## 2014-01-19 DIAGNOSIS — I739 Peripheral vascular disease, unspecified: Secondary | ICD-10-CM | POA: Diagnosis not present

## 2014-01-19 DIAGNOSIS — Z872 Personal history of diseases of the skin and subcutaneous tissue: Secondary | ICD-10-CM | POA: Insufficient documentation

## 2014-01-19 DIAGNOSIS — Z87891 Personal history of nicotine dependence: Secondary | ICD-10-CM | POA: Diagnosis not present

## 2014-01-19 DIAGNOSIS — Z7982 Long term (current) use of aspirin: Secondary | ICD-10-CM | POA: Diagnosis not present

## 2014-01-19 DIAGNOSIS — G8929 Other chronic pain: Secondary | ICD-10-CM | POA: Insufficient documentation

## 2014-01-19 DIAGNOSIS — R519 Headache, unspecified: Secondary | ICD-10-CM

## 2014-01-19 LAB — I-STAT CHEM 8, ED
BUN: 19 mg/dL (ref 6–23)
CALCIUM ION: 1.18 mmol/L (ref 1.13–1.30)
Chloride: 100 mEq/L (ref 96–112)
Creatinine, Ser: 0.9 mg/dL (ref 0.50–1.35)
Glucose, Bld: 125 mg/dL — ABNORMAL HIGH (ref 70–99)
HCT: 45 % (ref 39.0–52.0)
HEMOGLOBIN: 15.3 g/dL (ref 13.0–17.0)
POTASSIUM: 3.4 mmol/L — AB (ref 3.5–5.1)
Sodium: 140 mmol/L (ref 135–145)
TCO2: 26 mmol/L (ref 0–100)

## 2014-01-19 MED ORDER — ACETAMINOPHEN 325 MG PO TABS
650.0000 mg | ORAL_TABLET | Freq: Once | ORAL | Status: AC
  Administered 2014-01-19: 650 mg via ORAL
  Filled 2014-01-19: qty 2

## 2014-01-19 NOTE — Discharge Instructions (Signed)
Your labs and head CT are unremarkable for new changes. Follow up with your doctor for further evaluation as needed. Return to the ED with worsening or concerning symptoms.

## 2014-01-19 NOTE — ED Provider Notes (Signed)
CSN: [id number]     Arrival date & time 01/19/14  5009 History   First MD Initiated Contact with Patient 01/19/14 [id number]     Chief Complaint  Patient presents with  . Headache  . Hypertension   (Consider location/radiation/quality/duration/timing/severity/associated sxs/prior Treatment) HPI Raymond Morris is a 81 yo male presenting with report of headache that seems to begin gradually over the last week and then worsen 2 days ago.  He states the headache was the worst yesterday and the day before but is now improved. He took his blood pressure at home and noted he was concerned because the number ranged from 299B-716R systolic.  He called his PCP this am who instructed him to come to the ED for evaluation.  He also reports some intermittent symptoms over the last year including some shortness of breath when climbing stairs, but this is not new or changed.  He endorses bilaterally itchy, watery eyes with redness but this is bilateral and usual related to his allergies.  He denies any chest pain, new or worsening shortness of breath, new blurred vision, or focal deficit.    Past Medical History  Diagnosis Date  . Hyperlipidemia   . Hypertension   . COPD (chronic obstructive pulmonary disease)   . Depression   . Asthma   . GERD (gastroesophageal reflux disease)   . Hiatal hernia   . Phlebitis   . AAA (abdominal aortic aneurysm)   . Carotid artery occlusion   . Cellulitis of right foot 10/2013  . Upper respiratory infection     "I've had lots"  . Chronic bronchitis     "just about q yr"  . Stroke 2002    "didn't know I'd had one; just told me about it in 2015"  . Arthritis     "thumbs; hands" (10/08/2013)  . Chronic lower back pain   . Anxiety   . Pneumonia "9 times"    (10/08/2013)   Past Surgical History  Procedure Laterality Date  . Appendectomy    . Umbilical hernia repair    . Excisional hemorrhoidectomy    . Vasectomy    . Hernia repair     Family History  Problem  Relation Age of Onset  . Cancer Mother   . Cancer Father   . Stroke Sister   . Liver disease Sister    History  Substance Use Topics  . Smoking status: Former Smoker -- 0.50 packs/day for 28 years    Types: Cigarettes    Quit date: 09/07/1976  . Smokeless tobacco: Never Used  . Alcohol Use: No    Review of Systems  Constitutional: Negative for fever and chills.  HENT: Negative for sore throat.   Eyes: Positive for discharge, redness, itching and visual disturbance.  Respiratory: Positive for shortness of breath. Negative for cough.   Cardiovascular: Negative for chest pain and leg swelling.  Gastrointestinal: Negative for nausea, vomiting and diarrhea.  Genitourinary: Negative for dysuria.  Musculoskeletal: Negative for myalgias.  Skin: Negative for rash.  Neurological: Positive for headaches. Negative for dizziness, speech difficulty, weakness and numbness.    Allergies  Ceclor and Sulfa antibiotics  Home Medications   Prior to Admission medications   Medication Sig Start Date End Date Taking? Authorizing Provider  acetaminophen (TYLENOL) 500 MG tablet Take 1,500 mg by mouth every 6 (six) hours as needed for mild pain.    Historical Provider, MD  ADVAIR DISKUS 100-50 MCG/DOSE AEPB Inhale 1 puff into the lungs daily as needed (  shortness of breath).  10/21/12   Historical Provider, MD  albuterol (PROAIR HFA) 108 (90 BASE) MCG/ACT inhaler Inhale 2 puffs into the lungs every 6 (six) hours as needed for wheezing or shortness of breath.    Historical Provider, MD  aspirin EC 81 MG tablet Take 81 mg by mouth daily.      Historical Provider, MD  cetirizine (ZYRTEC) 10 MG chewable tablet Chew 10 mg by mouth daily.      Historical Provider, MD  colchicine 0.6 MG tablet Take 1 tablet (0.6 mg total) by mouth 2 (two) times daily. 10/13/13   Thurnell Lose, MD  doxycycline (VIBRA-TABS) 100 MG tablet Take 1 tablet (100 mg total) by mouth daily. Patient not taking: Reported on 01/19/2014  10/13/13   Thurnell Lose, MD  escitalopram (LEXAPRO) 20 MG tablet Take 10 mg by mouth daily.      Historical Provider, MD  esomeprazole (NEXIUM) 20 MG capsule Take 20 mg by mouth daily before breakfast.     Historical Provider, MD  hydrochlorothiazide (HYDRODIURIL) 25 MG tablet Take 25 mg by mouth daily.    Historical Provider, MD  HYDROcodone-acetaminophen (NORCO/VICODIN) 5-325 MG per tablet Take 1 tablet by mouth every 6 (six) hours as needed for moderate pain. 10/13/13   Thurnell Lose, MD  nebivolol (BYSTOLIC) 10 MG tablet Take 5 mg by mouth daily.    Historical Provider, MD  omega-3 acid ethyl esters (LOVAZA) 1 G capsule Take 2 g by mouth daily.      Historical Provider, MD  predniSONE (DELTASONE) 5 MG tablet Label  & dispense according to the schedule below. 10 Pills PO for 3 days then, 8 Pills PO for 3 days, 6 Pills PO for 3 days, 4 Pills PO for 3 days, 2 Pills PO for 3 days, 1 Pills PO for 3 days, 1/2 Pill  PO for 3 days then STOP. Total 95 pills. Patient not taking: Reported on 01/19/2014 10/13/13   Thurnell Lose, MD  rosuvastatin (CRESTOR) 20 MG tablet Take 10 mg by mouth daily.     Historical Provider, MD  Tamsulosin HCl (FLOMAX) 0.4 MG CAPS Take 0.4 mg by mouth daily.      Historical Provider, MD   BP 180/99 mmHg  Pulse 60  Resp 18  Ht 9" (0.229 m)  Wt 248 lb (112.492 kg)  BMI 2145.12 kg/m2  SpO2 95% Physical Exam  Constitutional: He is oriented to person, place, and time. He appears well-developed and well-nourished. No distress.  HENT:  Head: Normocephalic and atraumatic.  Eyes: EOM are normal. Pupils are equal, round, and reactive to light. Right conjunctiva is injected. Left conjunctiva is injected.  Neck: Neck supple. No thyromegaly present.  Cardiovascular: Normal rate, regular rhythm and intact distal pulses.  Exam reveals no gallop and no friction rub.   No murmur heard. Pulmonary/Chest: Effort normal and breath sounds normal. No respiratory distress. He has no  wheezes. He has no rales. He exhibits no tenderness.  Abdominal: Soft. There is no tenderness.  Musculoskeletal: He exhibits no tenderness.  Lymphadenopathy:    He has no cervical adenopathy.  Neurological: He is alert and oriented to person, place, and time. He has normal strength. He displays tremor. No cranial nerve deficit or sensory deficit. GCS eye subscore is 4. GCS verbal subscore is 5. GCS motor subscore is 6.  Cranial nerves 2-12 intact, tremor noted with finger to nose test.  Skin: Skin is warm and dry. No rash noted. He is  not diaphoretic.  Psychiatric: He has a normal mood and affect.  Nursing note and vitals reviewed.   ED Course  Procedures (including critical care time) Labs Review Labs Reviewed - No data to display  Imaging Review Ct Head Wo Contrast  01/19/2014   CLINICAL DATA:  81 year old hypertensive male presenting with intermittent bilateral parietal headaches and blurred vision. Prior infarct 2002.  EXAM: CT HEAD WITHOUT CONTRAST  TECHNIQUE: Contiguous axial images were obtained from the base of the skull through the vertex without intravenous contrast.  COMPARISON:  03/22/2005 brain MR report.  FINDINGS: No intracranial hemorrhage.  Small vessel disease type changes.  The basilar tip appears slightly dense which I suspect is normal rather than related to basilar tip thrombosis. No other findings to suggest acute infarct. If acute infarct is of high clinical concern, MR imaging could be performed.  Global age related atrophy without hydrocephalus.  No intracranial mass lesion noted on this unenhanced exam.  Mastoid air cells, middle ear cavities and visualized paranasal sinuses are clear. Orbital structures unremarkable.  IMPRESSION: No intracranial hemorrhage.  Small vessel disease type changes without definitive CT evidence of large acute infarct as detailed above.   Electronically Signed   By: Chauncey Cruel M.D.   On: 01/19/2014 11:31     EKG Interpretation None       MDM   Final diagnoses:  None   81 yo with gradual onset headache over the last week and high blood pressure readings over the last two days.  His headache today is improved but he presented to the ED at the instruction of his PCP.  He has also has multiple recurrent symptoms including intermittent shortness of breath with climbing stairs and reddened, itchy eyes and associated blurred vision but none of theses symptoms are associated with his current headache and none of the symptoms are new or consistent. Discussed the case with Dr. Reather Converse  His presentation is not concerning for any red flags for [facility], ICH, Meningitis, or temporal arteritis or closed angle glaucoma. He is  afebrile with no focal neuro deficits, nuchal rigidity, or eye pain.  Will check renal function with I-stat 8 and head CT.   12:00 PM: Hand-off report given to [employer], PA-C.  Head CT is negative for hemorrhage, awaiting result of I-stat 8.  Plan includes if labs negative, pt can follow-up with his PCP for follow-up of chronic symptoms.    Filed Vitals:   01/19/14 1004 01/19/14 1006  BP: 180/99   Pulse: 60   Resp: 18   Height:  9" (0.229 m)  Weight:  248 lb (112.492 kg)  SpO2: 95%    Meds given in ED:  Medications  acetaminophen (TYLENOL) tablet 650 mg (650 mg Oral Given 01/19/14 1113)    Discharge Medication List as of 01/19/2014  1:05 PM         Britt Bottom, NP 01/19/14 5329  Mariea Clonts, MD 01/20/14 [phone]

## 2014-01-19 NOTE — ED Notes (Signed)
Patient states he noticed a gradually headache onset last week got worse last 2 days, states over the past year noticed he can;'t remember things vision is blurry at times, states he found out in Nov. He had a stroke several years ago. C/o weakness in left hand. States vision from blurry last Monday with drainage from eyes. States today vision is clear.

## 2014-01-19 NOTE — ED Provider Notes (Signed)
12:12 PM Patient signed out to me by Clemens Catholic, NP. Patient pending Chem 8. If renal function is normal, patient can be discharged with PCP follow up.   1:04 PM Patient's labs unremarkable for acute change. Patient will be discharged with PCP follow up. No further evaluation needed at this time.   Results for orders placed or performed during the hospital encounter of 01/19/14  I-stat chem 8, ed  Result Value Ref Range   Sodium 140 135 - 145 mmol/L   Potassium 3.4 (L) 3.5 - 5.1 mmol/L   Chloride 100 96 - 112 mEq/L   BUN 19 6 - 23 mg/dL   Creatinine, Ser 0.90 0.50 - 1.35 mg/dL   Glucose, Bld 125 (H) 70 - 99 mg/dL   Calcium, Ion 1.18 1.13 - 1.30 mmol/L   TCO2 26 0 - 100 mmol/L   Hemoglobin 15.3 13.0 - 17.0 g/dL   HCT 45.0 39.0 - 52.0 %   Ct Head Wo Contrast  01/19/2014   CLINICAL DATA:  81 year old hypertensive male presenting with intermittent bilateral parietal headaches and blurred vision. Prior infarct 2002.  EXAM: CT HEAD WITHOUT CONTRAST  TECHNIQUE: Contiguous axial images were obtained from the base of the skull through the vertex without intravenous contrast.  COMPARISON:  03/22/2005 brain MR report.  FINDINGS: No intracranial hemorrhage.  Small vessel disease type changes.  The basilar tip appears slightly dense which I suspect is normal rather than related to basilar tip thrombosis. No other findings to suggest acute infarct. If acute infarct is of high clinical concern, MR imaging could be performed.  Global age related atrophy without hydrocephalus.  No intracranial mass lesion noted on this unenhanced exam.  Mastoid air cells, middle ear cavities and visualized paranasal sinuses are clear. Orbital structures unremarkable.  IMPRESSION: No intracranial hemorrhage.  Small vessel disease type changes without definitive CT evidence of large acute infarct as detailed above.   Electronically Signed   By: Chauncey Cruel M.D.   On: 01/19/2014 11:31      Alvina Chou, PA-C 01/19/14  1306  Mariea Clonts, MD 01/20/14 1630

## 2014-01-20 DIAGNOSIS — M7661 Achilles tendinitis, right leg: Secondary | ICD-10-CM | POA: Diagnosis not present

## 2014-01-20 DIAGNOSIS — M1A2790 Drug-induced chronic gout, unspecified ankle and foot, without tophus (tophi): Secondary | ICD-10-CM | POA: Diagnosis not present

## 2014-01-20 DIAGNOSIS — E876 Hypokalemia: Secondary | ICD-10-CM | POA: Diagnosis not present

## 2014-01-20 DIAGNOSIS — Z8673 Personal history of transient ischemic attack (TIA), and cerebral infarction without residual deficits: Secondary | ICD-10-CM | POA: Diagnosis not present

## 2014-01-20 DIAGNOSIS — R42 Dizziness and giddiness: Secondary | ICD-10-CM | POA: Diagnosis not present

## 2014-01-20 DIAGNOSIS — M2550 Pain in unspecified joint: Secondary | ICD-10-CM | POA: Diagnosis not present

## 2014-01-20 DIAGNOSIS — I10 Essential (primary) hypertension: Secondary | ICD-10-CM | POA: Diagnosis not present

## 2014-01-20 DIAGNOSIS — E780 Pure hypercholesterolemia: Secondary | ICD-10-CM | POA: Diagnosis not present

## 2014-01-20 DIAGNOSIS — Z79899 Other long term (current) drug therapy: Secondary | ICD-10-CM | POA: Diagnosis not present

## 2014-01-26 DIAGNOSIS — I10 Essential (primary) hypertension: Secondary | ICD-10-CM | POA: Diagnosis not present

## 2014-02-02 DIAGNOSIS — I10 Essential (primary) hypertension: Secondary | ICD-10-CM | POA: Diagnosis not present

## 2014-02-05 ENCOUNTER — Ambulatory Visit: Payer: Self-pay | Admitting: Neurology

## 2014-02-05 DIAGNOSIS — I10 Essential (primary) hypertension: Secondary | ICD-10-CM | POA: Diagnosis not present

## 2014-02-12 DIAGNOSIS — I10 Essential (primary) hypertension: Secondary | ICD-10-CM | POA: Diagnosis not present

## 2014-02-18 ENCOUNTER — Encounter: Payer: Self-pay | Admitting: Neurology

## 2014-02-19 ENCOUNTER — Ambulatory Visit (INDEPENDENT_AMBULATORY_CARE_PROVIDER_SITE_OTHER): Payer: Commercial Managed Care - HMO | Admitting: Neurology

## 2014-02-19 ENCOUNTER — Encounter: Payer: Self-pay | Admitting: Neurology

## 2014-02-19 VITALS — BP 144/83 | HR 56 | Temp 97.8°F | Ht 69.0 in | Wt 244.0 lb

## 2014-02-19 DIAGNOSIS — R413 Other amnesia: Secondary | ICD-10-CM | POA: Diagnosis not present
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
## 2014-02-19 NOTE — Patient Instructions (Addendum)
You have complaints of memory loss: memory loss or changes in cognitive function can have many reasons and does not always mean you have dementia. Conditions that can contribute to subjective or objective memory loss include: depression, stress, poor sleep from insomnia or sleep apnea, dehydration, fluctuation in blood sugar values, thyroid or electrolyte dysfunction. Dementia can be caused by stroke, brain atherosclerosis and by Alzheimer's disease or other, more rare and sometimes hereditary causes. We may do some additional testing, if needed. We will get the results from your recent blood work from Dr. Pennie Banter office. We will not start any medication for memory loss. Your memory loss is rather mild and we should probably follow you clinically. I would like to reevaluate you in 6 months. You do snore and I believe Dr. Shelia Media wants you to have a home sleep test.   I will ask Dr. Shelia Media to make sure your Thyroid function and B12 vitamin level are ok too. Blood work from 01/20/14 looked good.

## 2014-03-03 DIAGNOSIS — I10 Essential (primary) hypertension: Secondary | ICD-10-CM | POA: Diagnosis not present

## 2014-03-03 DIAGNOSIS — G5710 Meralgia paresthetica, unspecified lower limb: Secondary | ICD-10-CM | POA: Diagnosis not present

## 2014-03-18 DIAGNOSIS — M2550 Pain in unspecified joint: Secondary | ICD-10-CM | POA: Diagnosis not present

## 2014-03-18 DIAGNOSIS — M79673 Pain in unspecified foot: Secondary | ICD-10-CM | POA: Diagnosis not present

## 2014-04-30 DIAGNOSIS — J45909 Unspecified asthma, uncomplicated: Secondary | ICD-10-CM | POA: Diagnosis not present

## 2014-07-13 DIAGNOSIS — R609 Edema, unspecified: Secondary | ICD-10-CM | POA: Diagnosis not present

## 2014-07-28 ENCOUNTER — Other Ambulatory Visit (HOSPITAL_COMMUNITY): Payer: Self-pay | Admitting: Respiratory Therapy

## 2014-07-28 DIAGNOSIS — I10 Essential (primary) hypertension: Secondary | ICD-10-CM | POA: Diagnosis not present

## 2014-07-28 DIAGNOSIS — R918 Other nonspecific abnormal finding of lung field: Secondary | ICD-10-CM | POA: Diagnosis not present

## 2014-07-28 DIAGNOSIS — R062 Wheezing: Secondary | ICD-10-CM

## 2014-07-28 DIAGNOSIS — R600 Localized edema: Secondary | ICD-10-CM | POA: Diagnosis not present

## 2014-07-28 DIAGNOSIS — N401 Enlarged prostate with lower urinary tract symptoms: Secondary | ICD-10-CM | POA: Diagnosis not present

## 2014-08-04 DIAGNOSIS — M109 Gout, unspecified: Secondary | ICD-10-CM | POA: Diagnosis not present

## 2014-08-04 DIAGNOSIS — I10 Essential (primary) hypertension: Secondary | ICD-10-CM | POA: Diagnosis not present

## 2014-08-04 DIAGNOSIS — J45909 Unspecified asthma, uncomplicated: Secondary | ICD-10-CM | POA: Diagnosis not present

## 2014-08-06 ENCOUNTER — Encounter (HOSPITAL_COMMUNITY): Payer: Medicare HMO

## 2014-08-14 DIAGNOSIS — R351 Nocturia: Secondary | ICD-10-CM | POA: Diagnosis not present

## 2014-08-14 DIAGNOSIS — N403 Nodular prostate with lower urinary tract symptoms: Secondary | ICD-10-CM | POA: Diagnosis not present

## 2014-08-20 ENCOUNTER — Ambulatory Visit: Payer: Commercial Managed Care - HMO | Admitting: Neurology

## 2014-08-25 DIAGNOSIS — M109 Gout, unspecified: Secondary | ICD-10-CM | POA: Diagnosis not present

## 2014-08-25 DIAGNOSIS — Z23 Encounter for immunization: Secondary | ICD-10-CM | POA: Diagnosis not present

## 2014-08-25 DIAGNOSIS — I10 Essential (primary) hypertension: Secondary | ICD-10-CM | POA: Diagnosis not present

## 2014-08-25 DIAGNOSIS — J45909 Unspecified asthma, uncomplicated: Secondary | ICD-10-CM | POA: Diagnosis not present

## 2014-08-31 DIAGNOSIS — R7309 Other abnormal glucose: Secondary | ICD-10-CM | POA: Diagnosis not present

## 2014-08-31 DIAGNOSIS — I10 Essential (primary) hypertension: Secondary | ICD-10-CM | POA: Diagnosis not present

## 2014-08-31 DIAGNOSIS — E780 Pure hypercholesterolemia: Secondary | ICD-10-CM | POA: Diagnosis not present

## 2014-08-31 DIAGNOSIS — Z0000 Encounter for general adult medical examination without abnormal findings: Secondary | ICD-10-CM | POA: Diagnosis not present

## 2014-09-23 ENCOUNTER — Encounter: Payer: Self-pay | Admitting: Family

## 2014-09-25 ENCOUNTER — Encounter: Payer: Self-pay | Admitting: Family

## 2014-09-25 ENCOUNTER — Ambulatory Visit (INDEPENDENT_AMBULATORY_CARE_PROVIDER_SITE_OTHER): Payer: Commercial Managed Care - HMO | Admitting: Family

## 2014-09-25 ENCOUNTER — Ambulatory Visit (HOSPITAL_COMMUNITY)
Admission: RE | Admit: 2014-09-25 | Discharge: 2014-09-25 | Disposition: A | Discharge status: Home / Self Care | Payer: Commercial Managed Care - HMO | Source: Ambulatory Visit | Attending: Family | Admitting: Family

## 2014-09-25 VITALS — BP 187/95 | HR 49 | Temp 97.5°F | Resp 18 | Ht 69.0 in | Wt 241.0 lb

## 2014-09-25 DIAGNOSIS — I10 Essential (primary) hypertension: Secondary | ICD-10-CM | POA: Insufficient documentation

## 2014-09-25 DIAGNOSIS — I6521 Occlusion and stenosis of right carotid artery: Secondary | ICD-10-CM

## 2014-09-25 DIAGNOSIS — Z8673 Personal history of transient ischemic attack (TIA), and cerebral infarction without residual deficits: Secondary | ICD-10-CM | POA: Diagnosis not present

## 2014-09-25 DIAGNOSIS — I714 Abdominal aortic aneurysm, without rupture, unspecified: Secondary | ICD-10-CM

## 2014-09-25 DIAGNOSIS — E785 Hyperlipidemia, unspecified: Secondary | ICD-10-CM | POA: Insufficient documentation

## 2014-09-25 NOTE — Patient Instructions (Addendum)
Abdominal Aortic Aneurysm An aneurysm is a weakened or damaged part of an artery wall that bulges from the normal force of blood pumping through the body. An abdominal aortic aneurysm is an aneurysm that occurs in the lower part of the aorta, the main artery of the body.  The major concern with an abdominal aortic aneurysm is that it can enlarge and burst (rupture) or blood can flow between the layers of the wall of the aorta through a tear (aorticdissection). Both of these conditions can cause bleeding inside the body and can be life threatening unless diagnosed and treated promptly. CAUSES  The exact cause of an abdominal aortic aneurysm is unknown. Some contributing factors are:   A hardening of the arteries caused by the buildup of fat and other substances in the lining of a blood vessel (arteriosclerosis).  Inflammation of the walls of an artery (arteritis).   Connective tissue diseases, such as Marfan syndrome.   Abdominal trauma.   An infection, such as syphilis or staphylococcus, in the wall of the aorta (infectious aortitis) caused by bacteria. RISK FACTORS  Risk factors that contribute to an abdominal aortic aneurysm may include:  Age older than 60 years.   High blood pressure (hypertension).  Male gender.  Ethnicity (white race).  Obesity.  Family history of aneurysm (first degree relatives only).  Tobacco use. PREVENTION  The following healthy lifestyle habits may help decrease your risk of abdominal aortic aneurysm:  Quitting smoking. Smoking can raise your blood pressure and cause arteriosclerosis.  Limiting or avoiding alcohol.  Keeping your blood pressure, blood sugar level, and cholesterol levels within normal limits.  Decreasing your salt intake. In somepeople, too much salt can raise blood pressure and increase your risk of abdominal aortic aneurysm.  Eating a diet low in saturated fats and cholesterol.  Increasing your fiber intake by including  whole grains, vegetables, and fruits in your diet. Eating these foods may help lower blood pressure.  Maintaining a healthy weight.  Staying physically active and exercising regularly. SYMPTOMS  The symptoms of abdominal aortic aneurysm may vary depending on the size and rate of growth of the aneurysm.Most grow slowly and do not have any symptoms. When symptoms do occur, they may include:  Pain (abdomen, side, lower back, or groin). The pain may vary in intensity. A sudden onset of severe pain may indicate that the aneurysm has ruptured.  Feeling full after eating only small amounts of food.  Nausea or vomiting or both.  Feeling a pulsating lump in the abdomen.  Feeling faint or passing out. DIAGNOSIS  Since most unruptured abdominal aortic aneurysms have no symptoms, they are often discovered during diagnostic exams for other conditions. An aneurysm may be found during the following procedures:  Ultrasonography (A one-time screening for abdominal aortic aneurysm by ultrasonography is also recommended for all men aged 65-75 years who have ever smoked).  X-ray exams.  A computed tomography (CT).  Magnetic resonance imaging (MRI).  Angiography or arteriography. TREATMENT  Treatment of an abdominal aortic aneurysm depends on the size of your aneurysm, your age, and risk factors for rupture. Medication to control blood pressure and pain may be used to manage aneurysms smaller than 6 cm. Regular monitoring for enlargement may be recommended by your caregiver if:  The aneurysm is 3-4 cm in size (an annual ultrasonography may be recommended).  The aneurysm is 4-4.5 cm in size (an ultrasonography every 6 months may be recommended).  The aneurysm is larger than 4.5 cm in   size (your caregiver may ask that you be examined by a vascular surgeon). If your aneurysm is larger than 6 cm, surgical repair may be recommended. There are two main methods for repair of an aneurysm:   Endovascular  repair (a minimally invasive surgery). This is done most often.  Open repair. This method is used if an endovascular repair is not possible. Document Released: 09/28/2004 Document Revised: 04/15/2012 Document Reviewed: 01/19/2012 Exit[employer] Patient Information 2015 [employer]. This information is not intended to replace advice given to you by your health care provider. Make sure you discuss any questions you have with your health care provider.   Stroke Prevention Some medical conditions and behaviors are associated with an increased chance of having a stroke. You may prevent a stroke by making healthy choices and managing medical conditions. HOW CAN I REDUCE MY RISK OF HAVING A STROKE?   Stay physically active. Get at least 30 minutes of activity on most or all days.  Do not smoke. It may also be helpful to avoid exposure to secondhand smoke.  Limit alcohol use. Moderate alcohol use is considered to be:  No more than 2 drinks per day for men.  No more than 1 drink per day for nonpregnant women.  Eat healthy foods. This involves:  Eating 5 or more servings of fruits and vegetables a day.  Making dietary changes that address high blood pressure (hypertension), high cholesterol, diabetes, or obesity.  Manage your cholesterol levels.  Making food choices that are high in fiber and low in saturated fat, trans fat, and cholesterol may control cholesterol levels.  Take any prescribed medicines to control cholesterol as directed by your health care provider.  Manage your diabetes.  Controlling your carbohydrate and sugar intake is recommended to manage diabetes.  Take any prescribed medicines to control diabetes as directed by your health care provider.  Control your hypertension.  Making food choices that are low in salt (sodium), saturated fat, trans fat, and cholesterol is recommended to manage hypertension.  Take any prescribed medicines to control hypertension as directed  by your health care provider.  Maintain a healthy weight.  Reducing calorie intake and making food choices that are low in sodium, saturated fat, trans fat, and cholesterol are recommended to manage weight.  Stop drug abuse.  Avoid taking birth control pills.  Talk to your health care provider about the risks of taking birth control pills if you are over 35 years old, smoke, get migraines, or have ever had a blood clot.  Get evaluated for sleep disorders (sleep apnea).  Talk to your health care provider about getting a sleep evaluation if you snore a lot or have excessive sleepiness.  Take medicines only as directed by your health care provider.  For some people, aspirin or blood thinners (anticoagulants) are helpful in reducing the risk of forming abnormal blood clots that can lead to stroke. If you have the irregular heart rhythm of atrial fibrillation, you should be on a blood thinner unless there is a good reason you cannot take them.  Understand all your medicine instructions.  Make sure that other conditions (such as anemia or atherosclerosis) are addressed. SEEK IMMEDIATE MEDICAL CARE IF:   You have sudden weakness or numbness of the face, arm, or leg, especially on one side of the body.  Your face or eyelid droops to one side.  You have sudden confusion.  You have trouble speaking (aphasia) or understanding.  You have sudden trouble seeing in one or   both eyes.  You have sudden trouble walking.  You have dizziness.  You have a loss of balance or coordination.  You have a sudden, severe headache with no known cause.  You have new chest pain or an irregular heartbeat. Any of these symptoms may represent a serious problem that is an emergency. Do not wait to see if the symptoms will go away. Get medical help at once. Call your local emergency services (911 in [place] Do not drive yourself to the hospital. Document Released: 01/27/2004 Document Revised: 05/05/2013  Document Reviewed: 06/21/2012 Exit[employer] Patient Information 2015 [employer]. This information is not intended to replace advice given to you by your health care provider. Make sure you discuss any questions you have with your health care provider.  

## 2014-09-25 NOTE — Progress Notes (Signed)
Filed Vitals:   09/25/14 0952 09/25/14 0956 09/25/14 0958  BP: 188/94 192/99 187/95  Pulse: 50 49 49  Temp: 97.5 F (36.4 C)    Resp: 18    Height: 5\' 9"  (1.753 m)    Weight: 241 lb (109.317 kg)    SpO2: 97%

## 2014-09-25 NOTE — Progress Notes (Signed)
[facility] [place]  Established Abdominal Aortic Aneurysm  History of Present Illness  Raymond Morris is a 81 y.o. (Jun 14, 1931) male patient that Dr. Donnetta Hutching has been following for his small infrarenal abdominal aortic aneurysm. He had an ultrasound from an outlying facility showing a 3.5 cm abdominal aortic aneurysm.   He had CVA by MRA of head in 2002, but no stroke symptoms as far as patient knows.  Patient has not had previous CEA. Has chronic low back pain, was advised years ago that he has DDD, describes what sounds like bilateral sciatic pain with walking. He does not walk much due to this. Pt reports that this pain is getting worse over time, but denies any new back pain.   States he has not taken his medications today yet as he did not not if he should before this visit; advised to take his medications as soon as he gets home, is slightly hypertensive now. Pt states his 127-160/70-80 at home.  He takes irbesartan, Bystolic, and amlodipine for hypertension which he has not yet taken today.   He had an umbilical hernia repair several years ago, and he occasionally has pain at this site; he denies any other abdominal pain.  Patient denies New Medical or Surgical History.  Pt Diabetic: No Pt smoker: former smoker, quit 1978  Pt meds include: Statin : Yes Betablocker: Yes ASA: Yes Other anticoagulants/antiplatelets: no    Past Medical History  Diagnosis Date  . Hyperlipidemia   . Hypertension   . COPD (chronic obstructive pulmonary disease)   . Depression   . Asthma   . GERD (gastroesophageal reflux disease)   . Hiatal hernia   . Phlebitis     right leg  . AAA (abdominal aortic aneurysm)   . Carotid artery occlusion   . Cellulitis of right foot 10/2013  . Upper respiratory infection     "I've had lots"  . Chronic bronchitis     "just about q yr"  . Stroke 2002    "didn't know I'd had one; just told me about it in 2015"  . Arthritis      "thumbs; hands" (10/08/2013)  . Chronic lower back pain   . Anxiety   . Pneumonia "9 times"    (10/08/2013)  . Hypercholesterolemia   . Monoclonal gammopathy     DR Marin Olp, 2007,felt to be benign  . Edema     chronic, lower extremities  . Urinary frequency     elevated PSA , with prostate nodule, Dr [id number]  . AAA (abdominal aortic aneurysm)   . Diverticulosis   . CVA (cerebral infarction)     per MRA done 2002  . Gout     11/2013 surgeries   Past Surgical History  Procedure Laterality Date  . Appendectomy    . Umbilical hernia repair    . Excisional hemorrhoidectomy    . Vasectomy    . Hernia repair     Social History Social History   Social History  . Marital Status: Widowed    Spouse Name: N/A  . Number of Children: N/A  . Years of Education: N/A   Occupational History  . Not on file.   Social History Main Topics  . Smoking status: Former Smoker -- 0.50 packs/day for 28 years    Types: Cigarettes    Quit date: 09/07/1976  . Smokeless tobacco: Never Used  . Alcohol Use: No  . Drug Use: No  . Sexual Activity: Not on  file   Other Topics Concern  . Not on file   Social History Narrative   Family History Family History  Problem Relation Age of Onset  . Cancer Mother   . Cancer Father   . Stroke Sister   . Liver disease Sister   . Pneumonia Daughter     pnemoccoal, deceased  . Cerebral palsy Son     Current Outpatient Prescriptions on File Prior to Visit  Medication Sig Dispense Refill  . ADVAIR DISKUS 100-50 MCG/DOSE AEPB Inhale 1 puff into the lungs daily as needed (shortness of breath).     Marland Kitchen amLODipine (NORVASC) 5 MG tablet Take 5 mg by mouth daily.    Marland Kitchen aspirin EC 81 MG tablet Take 81 mg by mouth daily.      . cetirizine (ZYRTEC) 10 MG chewable tablet Chew 10 mg by mouth daily.      Marland Kitchen escitalopram (LEXAPRO) 20 MG tablet Take 20 mg by mouth daily. 1/2 -1 tablet once daily    . esomeprazole (NEXIUM) 20 MG capsule Take 20 mg by mouth daily  before breakfast.     . nebivolol (BYSTOLIC) 10 MG tablet Take 5 mg by mouth daily.    Marland Kitchen omega-3 acid ethyl esters (LOVAZA) 1 G capsule Take 1 g by mouth daily. Take 2 capsules daily    . rosuvastatin (CRESTOR) 20 MG tablet Take 20 mg by mouth daily.     . Tamsulosin HCl (FLOMAX) 0.4 MG CAPS Take 0.4 mg by mouth daily.      . colchicine 0.6 MG tablet Take 1 tablet (0.6 mg total) by mouth 2 (two) times daily. (Patient not taking: Reported on 09/25/2014) 30 tablet 0   No current facility-administered medications on file prior to visit.   Allergies  Allergen Reactions  . Ceclor [Cefaclor] Other (See Comments)    Causes muscle spasms  . Bactrim [Sulfamethoxazole-Trimethoprim]   . Sulfa Antibiotics Rash    ROS: See HPI for pertinent positives and negatives.  Physical Examination  Filed Vitals:   09/25/14 0952 09/25/14 0956 09/25/14 0958  BP: 188/94 192/99 187/95  Pulse: 50 49 49  Temp: 97.5 F (36.4 C)    Resp: 18    Height: 5\' 9"  (1.753 m)    Weight: 241 lb (109.317 kg)    SpO2: 97%     Body mass index is 35.57 kg/(m^2).  General: WDWN male in NAD, obese. GAIT: antalgic Eyes: PERRLA Pulmonary: CTAB, Negative Rales, Negative rhonchi, & transient expiratory wheezing in left base.  Cardiac: regular Rhythm, no detected murmur.  VASCULAR EXAM Carotid Bruits Left Right   Negative Negative  Aorta is not palpable.  Radial pulses are 2+ palpable bilaterally.   LE Pulses  LEFT  RIGHT   FEMORAL palpable Faintly palpable (obese)  POPLITEAL  not palpable  not palpable   POSTERIOR TIBIAL  palpable  palpable   DORSALIS PEDIS  ANTERIOR TIBIAL  palpable  palpable     Gastrointestinal: soft, NTND, -G/R, - HSM, - CVAT B. Ventral hernia apparent only when pt. is in the process of lying down or sitting up from supine position. Large panus.  Musculoskeletal: M/S 5/5 throughout, Extremities without ischemic changes. 1+ pitting edema in left lower  leg.  Neurologic: CN 2-12 intact except is slightly hard of hearing, Pain and light touch intact in extremities, Motor exam as listed above.       Non-Invasive Vascular Imaging  AAA Duplex (09/25/2014) ABDOMINAL AORTA DUPLEX EVALUATION    INDICATION: Abdominal aortic  aneurysm evaluation    PREVIOUS INTERVENTION(S): None    DUPLEX EXAM:     LOCATION DIAMETER AP (cm) DIAMETER TRANSVERSE (cm) VELOCITIES (cm/sec)  Aorta Proximal 2.5 2.3 32  Aorta Mid 2.2 2.0 52  Aorta Distal 3.3 3.2 83  Right Common Iliac Artery 1.3 1.1 78  Left Common Iliac Artery 1.3 1.2 97    Previous max aortic diameter:  3.2 3 x 3.0  Date: 08/03/2010 other facility     ADDITIONAL FINDINGS:     IMPRESSION: 1 .3.3 x 3.2 cm distal aortic aneurysm 2. Technically difficult exam due to body habitus and excessive bowel gas    Compared to the previous exam:  No significant change     Medical Decision Making  The patient is a 81 y.o. male who presents with asymptomatic small AAA with no increase in size since 2012.  He has a history of a stroke in 2002, as pt states was apparent on an MRA of his head, but he does not recall any stroke or TIA symptoms.  2014 carotid duplex shows <40% right ICA stenosis and normal left ICA.  Pt advised to take his morning medications as soon as possible today, to check his blood pressure daily, and to let his PCP know if his blood pressure remains over 140/90.   Based on this patient's exam and diagnostic studies, the patient will follow up in 2 years  with the following studies: AAA duplex and carotid duplex.  Consideration for repair of AAA would be made when the size is 5.5 cm, growth > 1 cm/yr, and symptomatic status.  I emphasized the importance of maximal medical management including strict control of blood pressure, blood glucose, and lipid levels, antiplatelet agents, obtaining regular exercise, and continued  cessation of smoking.   The patient was given information  about AAA including signs, symptoms, treatment, and how to minimize the risk of enlargement and rupture of aneurysms.    The patient was advised to call 911 should the patient experience sudden onset abdominal or back pain.   Thank you for allowing [place] to participate in this patient's care.  Clemon Chambers, RN, MSN, FNP-C [facility] [place] Office: [phone]  Clinic Physician: Bridgett Larsson  09/25/2014, 10:17 AM

## 2014-10-05 ENCOUNTER — Other Ambulatory Visit (HOSPITAL_COMMUNITY): Payer: Medicare HMO

## 2014-10-05 ENCOUNTER — Ambulatory Visit: Payer: Medicare HMO | Admitting: Family

## 2014-10-05 DIAGNOSIS — R319 Hematuria, unspecified: Secondary | ICD-10-CM | POA: Diagnosis not present

## 2014-10-05 DIAGNOSIS — N419 Inflammatory disease of prostate, unspecified: Secondary | ICD-10-CM | POA: Diagnosis not present

## 2014-10-05 DIAGNOSIS — N320 Bladder-neck obstruction: Secondary | ICD-10-CM | POA: Diagnosis not present

## 2015-03-02 DIAGNOSIS — I10 Essential (primary) hypertension: Secondary | ICD-10-CM | POA: Diagnosis not present

## 2015-03-16 DIAGNOSIS — I10 Essential (primary) hypertension: Secondary | ICD-10-CM | POA: Diagnosis not present

## 2015-03-29 DIAGNOSIS — I10 Essential (primary) hypertension: Secondary | ICD-10-CM | POA: Diagnosis not present

## 2015-04-05 DIAGNOSIS — I10 Essential (primary) hypertension: Secondary | ICD-10-CM | POA: Diagnosis not present

## 2015-04-12 DIAGNOSIS — J45909 Unspecified asthma, uncomplicated: Secondary | ICD-10-CM | POA: Diagnosis not present

## 2015-04-12 DIAGNOSIS — I10 Essential (primary) hypertension: Secondary | ICD-10-CM | POA: Diagnosis not present

## 2015-04-12 DIAGNOSIS — Z8673 Personal history of transient ischemic attack (TIA), and cerebral infarction without residual deficits: Secondary | ICD-10-CM | POA: Diagnosis not present

## 2015-04-19 DIAGNOSIS — I10 Essential (primary) hypertension: Secondary | ICD-10-CM | POA: Diagnosis not present

## 2015-04-27 DIAGNOSIS — I10 Essential (primary) hypertension: Secondary | ICD-10-CM | POA: Diagnosis not present

## 2015-06-01 DIAGNOSIS — I10 Essential (primary) hypertension: Secondary | ICD-10-CM | POA: Diagnosis not present

## 2015-06-08 DIAGNOSIS — R062 Wheezing: Secondary | ICD-10-CM | POA: Diagnosis not present

## 2015-06-08 DIAGNOSIS — I10 Essential (primary) hypertension: Secondary | ICD-10-CM | POA: Diagnosis not present

## 2015-06-30 DIAGNOSIS — N62 Hypertrophy of breast: Secondary | ICD-10-CM | POA: Diagnosis not present

## 2015-06-30 DIAGNOSIS — R1030 Lower abdominal pain, unspecified: Secondary | ICD-10-CM | POA: Diagnosis not present

## 2015-06-30 DIAGNOSIS — K5900 Constipation, unspecified: Secondary | ICD-10-CM | POA: Diagnosis not present

## 2015-07-01 DIAGNOSIS — N62 Hypertrophy of breast: Secondary | ICD-10-CM | POA: Diagnosis not present

## 2015-07-01 DIAGNOSIS — K5900 Constipation, unspecified: Secondary | ICD-10-CM | POA: Diagnosis not present

## 2015-07-07 DIAGNOSIS — R194 Change in bowel habit: Secondary | ICD-10-CM | POA: Diagnosis not present

## 2015-07-07 DIAGNOSIS — K219 Gastro-esophageal reflux disease without esophagitis: Secondary | ICD-10-CM | POA: Diagnosis not present

## 2015-07-07 DIAGNOSIS — I10 Essential (primary) hypertension: Secondary | ICD-10-CM | POA: Diagnosis not present

## 2015-07-21 ENCOUNTER — Other Ambulatory Visit: Payer: Self-pay | Admitting: Gastroenterology

## 2015-08-03 DIAGNOSIS — K439 Ventral hernia without obstruction or gangrene: Secondary | ICD-10-CM | POA: Diagnosis not present

## 2015-08-03 DIAGNOSIS — K5900 Constipation, unspecified: Secondary | ICD-10-CM | POA: Diagnosis not present

## 2015-08-04 ENCOUNTER — Other Ambulatory Visit: Payer: Self-pay | Admitting: Internal Medicine

## 2015-08-04 DIAGNOSIS — N62 Hypertrophy of breast: Secondary | ICD-10-CM

## 2015-08-13 ENCOUNTER — Ambulatory Visit (HOSPITAL_COMMUNITY)
Admission: RE | Admit: 2015-08-13 | Discharge: 2015-08-13 | Disposition: A | Discharge status: Home / Self Care | Payer: Commercial Managed Care - HMO | Source: Ambulatory Visit | Attending: Gastroenterology | Admitting: Gastroenterology

## 2015-08-13 ENCOUNTER — Encounter (HOSPITAL_COMMUNITY)
Admission: RE | Disposition: A | Discharge status: Home / Self Care | Payer: Self-pay | Source: Ambulatory Visit | Attending: Gastroenterology

## 2015-08-13 ENCOUNTER — Encounter (HOSPITAL_COMMUNITY): Payer: Self-pay

## 2015-08-13 DIAGNOSIS — E7800 Pure hypercholesterolemia, unspecified: Secondary | ICD-10-CM | POA: Diagnosis not present

## 2015-08-13 DIAGNOSIS — K5730 Diverticulosis of large intestine without perforation or abscess without bleeding: Secondary | ICD-10-CM | POA: Insufficient documentation

## 2015-08-13 DIAGNOSIS — F419 Anxiety disorder, unspecified: Secondary | ICD-10-CM | POA: Diagnosis not present

## 2015-08-13 DIAGNOSIS — J449 Chronic obstructive pulmonary disease, unspecified: Secondary | ICD-10-CM | POA: Insufficient documentation

## 2015-08-13 DIAGNOSIS — Z9049 Acquired absence of other specified parts of digestive tract: Secondary | ICD-10-CM | POA: Insufficient documentation

## 2015-08-13 DIAGNOSIS — F329 Major depressive disorder, single episode, unspecified: Secondary | ICD-10-CM | POA: Insufficient documentation

## 2015-08-13 DIAGNOSIS — D123 Benign neoplasm of transverse colon: Secondary | ICD-10-CM | POA: Diagnosis not present

## 2015-08-13 DIAGNOSIS — E785 Hyperlipidemia, unspecified: Secondary | ICD-10-CM | POA: Diagnosis not present

## 2015-08-13 DIAGNOSIS — K219 Gastro-esophageal reflux disease without esophagitis: Secondary | ICD-10-CM | POA: Insufficient documentation

## 2015-08-13 DIAGNOSIS — M109 Gout, unspecified: Secondary | ICD-10-CM | POA: Insufficient documentation

## 2015-08-13 DIAGNOSIS — I10 Essential (primary) hypertension: Secondary | ICD-10-CM | POA: Insufficient documentation

## 2015-08-13 DIAGNOSIS — Z8673 Personal history of transient ischemic attack (TIA), and cerebral infarction without residual deficits: Secondary | ICD-10-CM | POA: Insufficient documentation

## 2015-08-13 DIAGNOSIS — I714 Abdominal aortic aneurysm, without rupture: Secondary | ICD-10-CM | POA: Insufficient documentation

## 2015-08-13 DIAGNOSIS — D122 Benign neoplasm of ascending colon: Secondary | ICD-10-CM | POA: Insufficient documentation

## 2015-08-13 DIAGNOSIS — D125 Benign neoplasm of sigmoid colon: Secondary | ICD-10-CM | POA: Diagnosis not present

## 2015-08-13 DIAGNOSIS — R194 Change in bowel habit: Secondary | ICD-10-CM | POA: Diagnosis not present

## 2015-08-13 DIAGNOSIS — Z87891 Personal history of nicotine dependence: Secondary | ICD-10-CM | POA: Diagnosis not present

## 2015-08-13 HISTORY — PX: COLONOSCOPY: SHX5424

## 2015-08-13 SURGERY — COLONOSCOPY
Anesthesia: Moderate Sedation

## 2015-08-13 MED ORDER — DIPHENHYDRAMINE HCL 50 MG/ML IJ SOLN
INTRAMUSCULAR | Status: AC
  Filled 2015-08-13: qty 1

## 2015-08-13 MED ORDER — SODIUM CHLORIDE 0.9 % IV SOLN: INTRAVENOUS | Status: DC

## 2015-08-13 MED ORDER — MIDAZOLAM HCL 5 MG/5ML IJ SOLN
INTRAMUSCULAR | Status: DC | PRN
  Administered 2015-08-13 (×2): 2 mg via INTRAVENOUS
  Administered 2015-08-13: 1 mg via INTRAVENOUS

## 2015-08-13 MED ORDER — FENTANYL CITRATE (PF) 100 MCG/2ML IJ SOLN
INTRAMUSCULAR | Status: AC
  Filled 2015-08-13: qty 2

## 2015-08-13 MED ORDER — FENTANYL CITRATE (PF) 100 MCG/2ML IJ SOLN
INTRAMUSCULAR | Status: DC | PRN
  Administered 2015-08-13 (×2): 25 ug via INTRAVENOUS

## 2015-08-13 MED ORDER — MIDAZOLAM HCL 5 MG/ML IJ SOLN
INTRAMUSCULAR | Status: AC
  Filled 2015-08-13: qty 2

## 2015-08-13 NOTE — H&P (Signed)
Raymond Morris HPI: The patient reports a change in his bowel habits over the past 1-2 months.  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  No issues with hematochezia, melena, or overflow incontinence.  The patient does report having a long history of constipation and he was self-treating with Herbalax, but then he was switched over to Miralax QD.  He does not feel that it provides him the same efficiay.  His last colonoscopy was over 10 years ago with Dr. Bethann Berkshire.  The rectal examination performed at Dr. Pennie Banter office was negative for blood or masses  Past Medical History:  Diagnosis Date  . AAA (abdominal aortic aneurysm) ([place])   . AAA (abdominal aortic aneurysm) ([place])   . Anxiety   . Arthritis    "thumbs; hands" (10/08/2013)  . Asthma   . Carotid artery occlusion   . Cellulitis of right foot 10/2013  . Chronic bronchitis ([place])    "just about q yr"  . Chronic lower back pain   . COPD (chronic obstructive pulmonary disease) ([place])   . CVA (cerebral infarction)    per MRA done 2002  . Depression   . Diverticulosis   . Edema    chronic, lower extremities  . GERD (gastroesophageal reflux disease)   . Gout    11/2013 surgeries  . Hiatal hernia   . Hypercholesterolemia   . Hyperlipidemia   . Hypertension   . Monoclonal gammopathy    DR Marin Olp, 2007,felt to be benign  . Phlebitis    right leg  . Pneumonia "9 times"   (10/08/2013)  . Stroke [facility]) 2002   "didn't know I'd had one; just told me about it in 2015"  . Upper respiratory infection    "I've had lots"  . Urinary frequency    elevated PSA , with prostate nodule, Dr [id number]    Past Surgical History:  Procedure Laterality Date  . APPENDECTOMY    . EXCISIONAL HEMORRHOIDECTOMY    . HERNIA REPAIR    . UMBILICAL HERNIA REPAIR    . VASECTOMY      Family History  Problem Relation Age of Onset  . Cancer Mother   . Cancer Father   . Stroke Sister   . Liver disease  Sister   . Pneumonia Daughter     pnemoccoal, deceased  . Cerebral palsy Son     Social History:  reports that he quit smoking about 38 years ago. His smoking use included Cigarettes. He has a 14.00 pack-year smoking history. He has never used smokeless tobacco. He reports that he does not drink alcohol or use drugs.  Allergies:  Allergies  Allergen Reactions  . Ceclor [Cefaclor] Other (See Comments)    Causes muscle spasms  . Bactrim [Sulfamethoxazole-Trimethoprim]   . Sulfa Antibiotics Rash    Medications:  Scheduled:  Continuous: . sodium chloride      No results found for this or any previous visit (from the past 24 hour(s)).   No results found.  ROS:  As stated above in the HPI otherwise negative.  Blood pressure 126/60, pulse 60, temperature 98.3 F (36.8 C), temperature source Oral, resp. rate 14, height 5\' 9"  (1.753 m), weight 109.3 kg (241 lb), SpO2 99 %.    PE: Gen: NAD, Alert and Oriented HEENT:  [place]/AT, EOMI Neck: Supple, no LAD Lungs: CTA Bilaterally CV: RRR without M/G/R ABM: Soft, NTND, +BS Ext: No C/C/E  Assessment/Plan: 1)  Change in bowel habits - colonoscopy.  Rhyder Bratz D 08/13/2015, 11:47 AM

## 2015-08-13 NOTE — Op Note (Signed)
[facility] Patient Name: Raymond Morris Procedure Date: 08/13/2015 MRN: [id number] Attending MD: Carol Ada , MD Date of Birth: 1931/09/25 CSN: [id number] Age: 82 Admit Type: Inpatient Procedure:                Colonoscopy Indications:              Change in bowel habits Providers:                Carol Ada, MD, Laverta Baltimore RN, RN, Despina Pole Tech, Technician Referring MD:              Medicines:                Midazolam 5 mg IV, Fentanyl 50 micrograms IV Complications:            No immediate complications. Estimated Blood Loss:     Estimated blood loss was minimal. Procedure:                Pre-Anesthesia Assessment:                           - Prior to the procedure, a History and Physical                            was performed, and patient medications and                            allergies were reviewed. The patient's tolerance of                            previous anesthesia was also reviewed. The risks                            and benefits of the procedure and the sedation                            options and risks were discussed with the patient.                            All questions were answered, and informed consent                            was obtained. Prior Anticoagulants: The patient has                            taken no previous anticoagulant or antiplatelet                            agents. ASA Grade Assessment: III - A patient with                            severe systemic disease. After reviewing the risks  and benefits, the patient was deemed in                            satisfactory condition to undergo the procedure.                           - Sedation was administered by an endoscopy nurse.                            The sedation level attained was moderate.                           After obtaining informed consent, the colonoscope                            was  passed under direct vision. Throughout the                            procedure, the patient's blood pressure, pulse, and                            oxygen saturations were monitored continuously. The                            [id number]) scope was introduced through                            the anus and advanced to the the cecum, identified                            by appendiceal orifice and ileocecal valve. The                            colonoscopy was performed without difficulty. The                            patient tolerated the procedure well. The quality                            of the bowel preparation was good. The ileocecal                            valve, appendiceal orifice, and rectum were                            photographed. Scope In: 11:09:35 AM Scope Out: 11:42:49 AM Scope Withdrawal Time: 0 hours 23 minutes 9 seconds  Total Procedure Duration: 0 hours 33 minutes 14 seconds  Findings:      Eight sessile polyps were found in the transverse colon, hepatic flexure       and ascending colon. The polyps were 2 to 5 mm in size. These polyps       were removed with a cold snare. Resection and retrieval were complete.      Scattered large-mouthed diverticula were found in  the sigmoid colon. Impression:               - Eight 2 to 5 mm polyps in the transverse colon,                            at the hepatic flexure and in the ascending colon,                            removed with a cold snare. Resected and retrieved.                           - Diverticulosis in the sigmoid colon. Moderate Sedation:      Moderate (conscious) sedation was administered by the endoscopy nurse       and supervised by the endoscopist. The following parameters were       monitored: oxygen saturation, heart rate, blood pressure, and response       to care. Recommendation:           - Patient has a contact number available for                            emergencies. The signs and  symptoms of potential                            delayed complications were discussed with the                            patient. Return to normal activities tomorrow.                            Written discharge instructions were provided to the                            patient.                           - Resume previous diet.                           - Continue present medications.                           - Await pathology results.                           - Repeat colonoscopy in 3 years for surveillance if                            it is clinically appropriate.                           - Return to GI clinic in 4 weeks. Procedure Code(s):        --- Professional ---                           [phone], Colonoscopy, flexible; with  removal of                            tumor(s), polyp(s), or other lesion(s) by snare                            technique Diagnosis Code(s):        --- Professional ---                           D12.3, Benign neoplasm of transverse colon (hepatic                            flexure or splenic flexure)                           D12.2, Benign neoplasm of ascending colon                           R19.4, Change in bowel habit                           K57.30, Diverticulosis of large intestine without                            perforation or abscess without bleeding CPT copyright 2016 American Medical Association. All rights reserved. The codes documented in this report are preliminary and upon coder review may  be revised to meet current compliance requirements. Carol Ada, MD Carol Ada, MD 08/13/2015 11:56:40 AM This report has been signed electronically. Number of Addenda: 0

## 2015-08-13 NOTE — Discharge Instructions (Signed)

## 2015-08-16 ENCOUNTER — Encounter (HOSPITAL_COMMUNITY): Payer: Self-pay | Admitting: Gastroenterology

## 2015-08-17 DIAGNOSIS — M6208 Separation of muscle (nontraumatic), other site: Secondary | ICD-10-CM | POA: Diagnosis not present

## 2015-08-18 ENCOUNTER — Ambulatory Visit
Admission: RE | Admit: 2015-08-18 | Discharge: 2015-08-18 | Disposition: A | Discharge status: Home / Self Care | Payer: Commercial Managed Care - HMO | Source: Ambulatory Visit | Attending: Internal Medicine | Admitting: Internal Medicine

## 2015-08-18 DIAGNOSIS — N63 Unspecified lump in breast: Secondary | ICD-10-CM | POA: Diagnosis not present

## 2015-08-18 DIAGNOSIS — N62 Hypertrophy of breast: Secondary | ICD-10-CM

## 2015-09-03 DIAGNOSIS — K429 Umbilical hernia without obstruction or gangrene: Secondary | ICD-10-CM | POA: Diagnosis not present

## 2015-09-03 DIAGNOSIS — N62 Hypertrophy of breast: Secondary | ICD-10-CM | POA: Diagnosis not present

## 2015-09-03 DIAGNOSIS — Z23 Encounter for immunization: Secondary | ICD-10-CM | POA: Diagnosis not present

## 2015-09-03 DIAGNOSIS — K5900 Constipation, unspecified: Secondary | ICD-10-CM | POA: Diagnosis not present

## 2015-09-08 DIAGNOSIS — D123 Benign neoplasm of transverse colon: Secondary | ICD-10-CM | POA: Diagnosis not present

## 2015-09-08 DIAGNOSIS — R194 Change in bowel habit: Secondary | ICD-10-CM | POA: Diagnosis not present

## 2015-09-10 DIAGNOSIS — R972 Elevated prostate specific antigen [PSA]: Secondary | ICD-10-CM | POA: Diagnosis not present

## 2015-09-10 DIAGNOSIS — R351 Nocturia: Secondary | ICD-10-CM | POA: Diagnosis not present

## 2015-09-10 DIAGNOSIS — N403 Nodular prostate with lower urinary tract symptoms: Secondary | ICD-10-CM | POA: Diagnosis not present

## 2015-09-10 DIAGNOSIS — N320 Bladder-neck obstruction: Secondary | ICD-10-CM | POA: Diagnosis not present

## 2015-09-15 DIAGNOSIS — K429 Umbilical hernia without obstruction or gangrene: Secondary | ICD-10-CM | POA: Diagnosis not present

## 2015-09-15 DIAGNOSIS — I10 Essential (primary) hypertension: Secondary | ICD-10-CM | POA: Diagnosis not present

## 2015-09-15 DIAGNOSIS — N62 Hypertrophy of breast: Secondary | ICD-10-CM | POA: Diagnosis not present

## 2015-10-14 DIAGNOSIS — I10 Essential (primary) hypertension: Secondary | ICD-10-CM | POA: Diagnosis not present

## 2015-12-15 DIAGNOSIS — I10 Essential (primary) hypertension: Secondary | ICD-10-CM | POA: Diagnosis not present

## 2015-12-15 DIAGNOSIS — R2689 Other abnormalities of gait and mobility: Secondary | ICD-10-CM | POA: Diagnosis not present

## 2015-12-15 DIAGNOSIS — R102 Pelvic and perineal pain: Secondary | ICD-10-CM | POA: Diagnosis not present

## 2015-12-15 DIAGNOSIS — L309 Dermatitis, unspecified: Secondary | ICD-10-CM | POA: Diagnosis not present

## 2015-12-21 DIAGNOSIS — I10 Essential (primary) hypertension: Secondary | ICD-10-CM | POA: Diagnosis not present

## 2016-01-06 ENCOUNTER — Encounter: Payer: Self-pay | Admitting: Neurology

## 2016-01-06 ENCOUNTER — Ambulatory Visit (INDEPENDENT_AMBULATORY_CARE_PROVIDER_SITE_OTHER): Payer: Medicare HMO | Admitting: Neurology

## 2016-01-06 VITALS — BP 111/70 | HR 64 | Resp 18 | Ht 69.0 in | Wt 245.0 lb

## 2016-01-06 DIAGNOSIS — Z8673 Personal history of transient ischemic attack (TIA), and cerebral infarction without residual deficits: Secondary | ICD-10-CM | POA: Diagnosis not present

## 2016-01-06 DIAGNOSIS — R2689 Other abnormalities of gait and mobility: Secondary | ICD-10-CM | POA: Diagnosis not present

## 2016-01-06 DIAGNOSIS — R9089 Other abnormal findings on diagnostic imaging of central nervous system: Secondary | ICD-10-CM

## 2016-01-06 DIAGNOSIS — R42 Dizziness and giddiness: Secondary | ICD-10-CM

## 2016-01-06 NOTE — Patient Instructions (Addendum)
Please make an appointment with your ENT doctor, Dr. Erik Obey, to address your vertigo type symptoms, hearing loss and ringing in the ears, and both ear canals are impacted with wax.   Your neurological exam looks good, I am pleased to say.   Nevertheless, remember, that vertigo can recur without warning, triggers could be stress, sleep deprivation, dehydration and even taking a bumpy car ride, sometimes an acute illness, even a common (viral) cold. It can last hours or days. Please change positions slowly and always stay well-hydrated. Physical therapy with particular attention to vestibular rehabilitation can be very helpful. You may want to talk about it with Dr. Erik Obey.   We will do a brain scan, called MRI and call you with the test results. We will have to schedule you for this on a separate date. This test requires authorization from your insurance, and we will take care of the insurance process.
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
## 2016-01-18 ENCOUNTER — Other Ambulatory Visit: Payer: Self-pay | Admitting: Neurology

## 2016-01-18 DIAGNOSIS — Z77018 Contact with and (suspected) exposure to other hazardous metals: Secondary | ICD-10-CM

## 2016-01-27 ENCOUNTER — Ambulatory Visit
Admission: RE | Admit: 2016-01-27 | Discharge: 2016-01-27 | Disposition: A | Discharge status: Home / Self Care | Payer: Commercial Managed Care - HMO | Source: Ambulatory Visit | Attending: Neurology | Admitting: Neurology

## 2016-01-27 DIAGNOSIS — R9089 Other abnormal findings on diagnostic imaging of central nervous system: Secondary | ICD-10-CM

## 2016-01-27 DIAGNOSIS — Z8673 Personal history of transient ischemic attack (TIA), and cerebral infarction without residual deficits: Secondary | ICD-10-CM

## 2016-01-27 DIAGNOSIS — R42 Dizziness and giddiness: Secondary | ICD-10-CM | POA: Diagnosis not present

## 2016-01-27 DIAGNOSIS — R2689 Other abnormalities of gait and mobility: Secondary | ICD-10-CM

## 2016-01-27 DIAGNOSIS — Z77018 Contact with and (suspected) exposure to other hazardous metals: Secondary | ICD-10-CM

## 2016-02-01 NOTE — Progress Notes (Signed)
Please call patient regarding the recent brain MRI: The brain scan showed a normal structure of the brain and mild to moderate volume loss which we call atrophy. There were changes in the deeper structures of the brain, which we call white matter changes or microvascular changes. These were reported as mild in His case. These are tiny white spots, that occur with time and are seen in a variety of conditions, including with normal aging, chronic hypertension, chronic headaches, especially migraine HAs, chronic diabetes, chronic hyperlipidemia. These are not strokes and no mass or lesion were seen which is reassuring. Of note, compared to his brain MRI from 10 years ago in March 2007, his brain atrophy and vascular changes or hardening of the artery type changes have progressed which of course is expected in a long time span of 10 years. Again, there were no acute findings, such as a stroke, or mass or blood products. No further action is required on this test at this time, other than re-enforcing the importance of good blood pressure control, good cholesterol control, good blood sugar control, and weight management. Please remind patient to keep any upcoming appointments or tests and to call us with any interim questions, concerns, problems or updates. Thanks,  Star Age, MD, PhD

## 2016-02-02 ENCOUNTER — Telehealth: Payer: Self-pay

## 2016-02-02 NOTE — Telephone Encounter (Signed)
I spoke to patient and he is aware of results and recommendations.  

## 2016-02-02 NOTE — Telephone Encounter (Signed)
-----   Message from Star Age, MD sent at 02/01/2016  7:38 PM EST ----- Please call patient regarding the recent brain MRI: The brain scan showed a normal structure of the brain and mild to moderate volume loss which we call atrophy. There were changes in the deeper structures of the brain, which we call white matter changes or microvascular changes. These were reported as mild in His case. These are tiny white spots, that occur with time and are seen in a variety of conditions, including with normal aging, chronic hypertension, chronic headaches, especially migraine HAs, chronic diabetes, chronic hyperlipidemia. These are not strokes and no mass or lesion were seen which is reassuring. Of note, compared to his brain MRI from 10 years ago in March 2007, his brain atrophy and vascular changes or hardening of the artery type changes have progressed which of course is expected in a long time span of 10 years. Again, there were no acute findings, such as a stroke, or mass or blood products. No further action is required on this test at this time, other than re-enforcing the importance of good blood pressure control, good cholesterol control, good blood sugar control, and weight management. Please remind patient to keep any upcoming appointments or tests and to call us with any interim questions, concerns, problems or updates. Thanks,  Star Age, MD, PhD

## 2016-03-10 DIAGNOSIS — N320 Bladder-neck obstruction: Secondary | ICD-10-CM | POA: Diagnosis not present

## 2016-03-10 DIAGNOSIS — N402 Nodular prostate without lower urinary tract symptoms: Secondary | ICD-10-CM | POA: Diagnosis not present

## 2016-03-10 DIAGNOSIS — R972 Elevated prostate specific antigen [PSA]: Secondary | ICD-10-CM | POA: Diagnosis not present

## 2016-04-04 DIAGNOSIS — H6122 Impacted cerumen, left ear: Secondary | ICD-10-CM | POA: Diagnosis not present

## 2016-04-04 DIAGNOSIS — R600 Localized edema: Secondary | ICD-10-CM | POA: Diagnosis not present

## 2016-04-04 DIAGNOSIS — J4541 Moderate persistent asthma with (acute) exacerbation: Secondary | ICD-10-CM | POA: Diagnosis not present

## 2016-04-04 DIAGNOSIS — M79673 Pain in unspecified foot: Secondary | ICD-10-CM | POA: Diagnosis not present

## 2016-04-04 DIAGNOSIS — M2550 Pain in unspecified joint: Secondary | ICD-10-CM | POA: Diagnosis not present

## 2016-04-04 DIAGNOSIS — M7989 Other specified soft tissue disorders: Secondary | ICD-10-CM | POA: Diagnosis not present

## 2016-04-24 DIAGNOSIS — J301 Allergic rhinitis due to pollen: Secondary | ICD-10-CM | POA: Diagnosis not present

## 2016-04-24 DIAGNOSIS — R0602 Shortness of breath: Secondary | ICD-10-CM | POA: Diagnosis not present

## 2016-04-25 DIAGNOSIS — R600 Localized edema: Secondary | ICD-10-CM | POA: Diagnosis not present

## 2016-04-25 DIAGNOSIS — J4541 Moderate persistent asthma with (acute) exacerbation: Secondary | ICD-10-CM | POA: Diagnosis not present

## 2016-04-25 DIAGNOSIS — I10 Essential (primary) hypertension: Secondary | ICD-10-CM | POA: Diagnosis not present

## 2016-05-29 DIAGNOSIS — I2510 Atherosclerotic heart disease of native coronary artery without angina pectoris: Secondary | ICD-10-CM | POA: Diagnosis not present

## 2016-05-29 DIAGNOSIS — R0602 Shortness of breath: Secondary | ICD-10-CM | POA: Diagnosis not present

## 2016-05-29 DIAGNOSIS — J301 Allergic rhinitis due to pollen: Secondary | ICD-10-CM | POA: Diagnosis not present

## 2016-05-29 DIAGNOSIS — J9801 Acute bronchospasm: Secondary | ICD-10-CM | POA: Diagnosis not present

## 2016-07-10 ENCOUNTER — Other Ambulatory Visit: Payer: Self-pay

## 2016-07-10 DIAGNOSIS — I714 Abdominal aortic aneurysm, without rupture, unspecified: Secondary | ICD-10-CM

## 2016-07-10 DIAGNOSIS — I6523 Occlusion and stenosis of bilateral carotid arteries: Secondary | ICD-10-CM

## 2016-07-17 ENCOUNTER — Encounter: Payer: Self-pay | Admitting: Family

## 2016-07-17 ENCOUNTER — Ambulatory Visit (INDEPENDENT_AMBULATORY_CARE_PROVIDER_SITE_OTHER): Payer: Medicare HMO | Admitting: Family

## 2016-07-17 ENCOUNTER — Ambulatory Visit (HOSPITAL_COMMUNITY)
Admission: RE | Admit: 2016-07-17 | Discharge: 2016-07-17 | Disposition: A | Discharge status: Home / Self Care | Payer: Medicare HMO | Source: Ambulatory Visit | Attending: Family | Admitting: Family

## 2016-07-17 VITALS — BP 141/75 | HR 58 | Temp 97.3°F | Resp 20 | Ht 69.0 in | Wt 245.0 lb

## 2016-07-17 DIAGNOSIS — I714 Abdominal aortic aneurysm, without rupture, unspecified: Secondary | ICD-10-CM

## 2016-07-17 DIAGNOSIS — R0989 Other specified symptoms and signs involving the circulatory and respiratory systems: Secondary | ICD-10-CM | POA: Diagnosis not present

## 2016-07-17 NOTE — Patient Instructions (Signed)
Abdominal Aortic Aneurysm Blood pumps away from the heart through tubes (blood vessels) called arteries. Aneurysms are weak or damaged places in the wall of an artery. It bulges out like a balloon. An abdominal aortic aneurysm happens in the main artery of the body (aorta). It can burst or tear, causing bleeding inside the body. This is an emergency. It needs treatment right away. What are the causes? The exact cause is unknown. Things that could cause this problem include:  Fat and other substances building up in the lining of a tube.  Swelling of the walls of a blood vessel.  Certain tissue diseases.  Belly (abdominal) trauma.  An infection in the main artery of the body.  What increases the risk? There are things that make it more likely for you to have an aneurysm. These include:  Being over the age of 81 years old.  Having high blood pressure (hypertension).  Being a male.  Being white.  Being very overweight (obese).  Having a family history of aneurysm.  Using tobacco products.  What are the signs or symptoms? Symptoms depend on the size of the aneurysm and how fast it grows. There may not be symptoms. If symptoms occur, they can include:  Pain (belly, side, lower back, or groin).  Feeling full after eating a small amount of food.  Feeling sick to your stomach (nauseous), throwing up (vomiting), or both.  Feeling a lump in your belly that feels like it is beating (pulsating).  Feeling like you will pass out (faint).  How is this treated?  Medicine to control blood pressure and pain.  Imaging tests to see if the aneurysm gets bigger.  Surgery. How is this prevented? To lessen your chance of getting this condition:  Stop smoking. Stop chewing tobacco.  Limit or avoid alcohol.  Keep your blood pressure, blood sugar, and cholesterol within normal limits.  Eat less salt.  Eat foods low in saturated fats and cholesterol. These are found in animal and  whole dairy products.  Eat more fiber. Fiber is found in whole grains, vegetables, and fruits.  Keep a healthy weight.  Stay active and exercise often.  This information is not intended to replace advice given to you by your health care provider. Make sure you discuss any questions you have with your health care provider. Document Released: 04/15/2012 Document Revised: 05/27/2015 Document Reviewed: 01/19/2012 [employer] Interactive Patient Education  2017 [employer]  

## 2016-07-17 NOTE — Progress Notes (Signed)
[facility] [place]   CC: Follow up Abdominal Aortic Aneurysm  History of Present Illness  Raymond Morris is a 83 y.o. (Apr 21, 1931) male patient that Dr. Donnetta Hutching has been following for his small infrarenal abdominal aortic aneurysm. He had an ultrasound from an outlying facility showing a 3.5 cm abdominal aortic aneurysm.   He returns today with report of 6 day hx of LLQ abdominal pain that spontaneously resolved about a week ago. He states he has occasional constipation.  He had an umbilical hernia repair several years ago, and he occasionally has pain at this site. He had a colonoscopy in August 2017, states several benign polyps were removed.   He had CVA by MRA of head in 2002, but no stroke symptoms as far as patient knows.  Patient has not had previous CEA. Has chronic low back pain, was advised years ago that he has DDD, describes what sounds like bilateral sciatic pain with walking. He does not walk much due to this. Pt reports that this pain is getting worse over time, but denies any new back pain.   States he has not taken his medications today yet as he did not not if he should before this visit; advised to take his medications as soon as he gets home, is slightly hypertensive now. Pt states his 127-160/70-80 at home.  He takes irbesartan, Bystolic, and amlodipine for hypertension which he has not yet taken today.   Pt Diabetic: No Pt smoker: former smoker, quit 1978  Pt meds include: Statin : Yes Betablocker: Yes ASA: Yes Other anticoagulants/antiplatelets: no   Past Medical History:  Diagnosis Date  . AAA (abdominal aortic aneurysm) ([place])   . AAA (abdominal aortic aneurysm) ([place])   . Anxiety   . Arthritis    "thumbs; hands" (10/08/2013)  . Asthma   . Carotid artery occlusion   . Cellulitis of right foot 10/2013  . Chronic bronchitis ([place])    "just about q yr"  . Chronic lower back pain   . COPD (chronic obstructive pulmonary disease)  ([place])   . CVA (cerebral infarction)    per MRA done 2002  . Depression   . Diverticulosis   . Edema    chronic, lower extremities  . GERD (gastroesophageal reflux disease)   . Gout    11/2013 surgeries  . Hiatal hernia   . Hypercholesterolemia   . Hyperlipidemia   . Hypertension   . Monoclonal gammopathy    DR Marin Olp, 2007,felt to be benign  . Phlebitis    right leg  . Pneumonia "9 times"   (10/08/2013)  . Stroke [facility]) 2002   "didn't know I'd had one; just told me about it in 2015"  . Upper respiratory infection    "I've had lots"  . Urinary frequency    elevated PSA , with prostate nodule, Dr [id number]   Past Surgical History:  Procedure Laterality Date  . APPENDECTOMY    . COLONOSCOPY N/A 08/13/2015   Procedure: COLONOSCOPY;  Surgeon: Carol Ada, MD;  Location: [facility];  Service: Endoscopy;  Laterality: N/A;  . EXCISIONAL HEMORRHOIDECTOMY    . HERNIA REPAIR    . UMBILICAL HERNIA REPAIR    . VASECTOMY     Social History Social History   Social History  . Marital status: Widowed    Spouse name: N/A  . Number of children: N/A  . Years of education: N/A   Occupational History  . Not on file.   Social History  Main Topics  . Smoking status: Former Smoker    Packs/day: 0.50    Years: 28.00    Types: Cigarettes    Quit date: 09/07/1976  . Smokeless tobacco: Never Used  . Alcohol use No  . Drug use: No  . Sexual activity: Not on file   Other Topics Concern  . Not on file   Social History Narrative  . No narrative on file   Family History Family History  Problem Relation Age of Onset  . Cancer Mother   . Cancer Father   . Stroke Sister   . Liver disease Sister   . Pneumonia Daughter        pnemoccoal, deceased  . Cerebral palsy Son     Current Outpatient Prescriptions on File Prior to Visit  Medication Sig Dispense Refill  . ADVAIR DISKUS 100-50 MCG/DOSE AEPB Inhale 1 puff into the lungs daily as needed (shortness of breath).     Marland Kitchen  amLODipine (NORVASC) 10 MG tablet     . aspirin EC 81 MG tablet Take 81 mg by mouth daily.      . carvedilol (COREG) 3.125 MG tablet     . cetirizine (ZYRTEC) 10 MG chewable tablet Chew 10 mg by mouth daily.      Marland Kitchen escitalopram (LEXAPRO) 20 MG tablet Take 20 mg by mouth daily. 1/2 -1 tablet once daily    . esomeprazole (NEXIUM) 20 MG capsule Take 20 mg by mouth daily before breakfast.     . omega-3 acid ethyl esters (LOVAZA) 1 G capsule Take 1 g by mouth daily. Take 2 capsules daily    . rosuvastatin (CRESTOR) 20 MG tablet Take 20 mg by mouth daily.     . Tamsulosin HCl (FLOMAX) 0.4 MG CAPS Take 0.4 mg by mouth daily.       No current facility-administered medications on file prior to visit.    Allergies  Allergen Reactions  . Ceclor [Cefaclor] Other (See Comments)    Causes muscle spasms  . Bactrim [Sulfamethoxazole-Trimethoprim]   . Sulfa Antibiotics Rash    ROS: See HPI for pertinent positives and negatives.  Physical Examination  Vitals:   07/17/16 0857  BP: (!) 141/75  Pulse: (!) 58  Resp: 20  Temp: (!) 97.3 F (36.3 C)  TempSrc: Oral  SpO2: 94%  Weight: 245 lb (111.1 kg)  Height: 5\' 9"  (1.753 m)   Body mass index is 36.18 kg/m.  General:WDWN obese male in NAD. GAIT: antalgic Eyes: PERRLA Pulmonary: Respirations are non labored, CTAB, no rales,rhonchi, or wheezing.  Cardiac: regular rhythm, mild bradycardia (on a beta blocker), no detected murmur.  VASCULAR EXAM Carotid Bruits Left Right   Negative Negative  Aorta is not palpable.  Radial pulses are 2+ palpable bilaterally.   LE Pulses  LEFT  RIGHT   FEMORAL Not palpable (obese) not palpable (obese)  POPLITEAL  2+ palpable  2+ palpable   POSTERIOR TIBIAL  2+palpable  2+palpable   DORSALIS PEDIS  ANTERIOR TIBIAL  2+palpable  2+palpable     Gastrointestinal: softly obese, NTND, -G/R, - HSM, - CVAT B. Asymptomatic ventral hernia apparent only when pt. is in the process of lying  down or sitting up from supine position. Large panus.  Musculoskeletal: M/S 5/5 throughout, Extremities without ischemic changes. No peripheral edema.   Neurologic: CN 2-12 intact except is hard of hearing, Pain and light touch intact in extremities, Motor exam as listed above    Non-Invasive Vascular Imaging  AAA Duplex (07/17/2016)  Previous size: 3.3 cm (Date: 09-25-14), Right CIA: 1.3 cm; Left CIA: 1.3 cm  Current size:  3.6 cm (Date: 07/17/16); Right CIA: 1.8 cm; Left CIA: 1.7 cm  Medical Decision Making  The patient is a 83 y.o. male who presents with asymptomatic AAA with 3 mm increase in size in almost 2 years, the AAA size remains small at 3.6 cm, was 3.3 cm in September 2016.  His 6 day recent hx of LLQ abdominal pain resolved spontaneously. His AAA remains small; this is not the source of his recent abdominal pain. He has a hx of umbilical hernia repair and has a moderate sized asymptomatic ventral hernia that is only apparent in the process of lying supine or sitting up (when he uses his abdominal muscles). He also has a large panus. He also has recurrent constipation, takes an herbal preparation to help with this.   Consider evaluation for left inguinal hernia or diverticulosis/diverticulitis, defer to his PCP.   He has a history of a stroke in 2002, as pt states was apparent on an MRA of his head, but he does not recall any stroke or TIA symptoms.  2014 carotid duplex shows <40% right ICA stenosis and normal left ICA.    Based on this patient's exam and diagnostic studies, the patient will follow up in 1 year  with the following studies: AAA duplex and bilateral popliteal artery duplex to evaluate prominent popliteal pulses. He has no claudication symptoms with walking, bilateral pedal pulses are 2+ palpable.   He need not return in September since he was here today.   Consideration for repair of AAA would be made when the size is 5.0 cm, growth > 1 cm/yr, and symptomatic  status.        The patient was given information about AAA including signs, symptoms, treatment, and how to minimize the risk of enlargement and rupture of aneurysms.    I emphasized the importance of maximal medical management including strict control of blood pressure, blood glucose, and lipid levels, antiplatelet agents, obtaining regular exercise, and continued cessation of smoking.   The patient was advised to call 911 should the patient experience sudden onset abdominal or back pain.   Thank you for allowing [place] to participate in this patient's care.  Clemon Chambers, RN, MSN, FNP-C [facility] [place] Office: [phone]  Clinic Physician: Early on call  07/17/2016, 9:18 AM

## 2016-09-26 ENCOUNTER — Ambulatory Visit (INDEPENDENT_AMBULATORY_CARE_PROVIDER_SITE_OTHER): Payer: Medicare HMO | Admitting: Family

## 2016-09-26 ENCOUNTER — Ambulatory Visit (HOSPITAL_COMMUNITY)
Admission: RE | Admit: 2016-09-26 | Discharge: 2016-09-26 | Disposition: A | Discharge status: Home / Self Care | Payer: Medicare HMO | Source: Ambulatory Visit | Attending: Vascular Surgery | Admitting: Vascular Surgery

## 2016-09-26 ENCOUNTER — Encounter: Payer: Self-pay | Admitting: Family

## 2016-09-26 ENCOUNTER — Other Ambulatory Visit (HOSPITAL_COMMUNITY): Payer: Commercial Managed Care - HMO

## 2016-09-26 VITALS — BP 143/82 | HR 51 | Temp 97.3°F | Resp 20 | Ht 69.0 in | Wt 243.0 lb

## 2016-09-26 DIAGNOSIS — E7800 Pure hypercholesterolemia, unspecified: Secondary | ICD-10-CM | POA: Diagnosis not present

## 2016-09-26 DIAGNOSIS — I714 Abdominal aortic aneurysm, without rupture, unspecified: Secondary | ICD-10-CM

## 2016-09-26 DIAGNOSIS — R0989 Other specified symptoms and signs involving the circulatory and respiratory systems: Secondary | ICD-10-CM

## 2016-09-26 DIAGNOSIS — Z7982 Long term (current) use of aspirin: Secondary | ICD-10-CM | POA: Diagnosis not present

## 2016-09-26 DIAGNOSIS — I6523 Occlusion and stenosis of bilateral carotid arteries: Secondary | ICD-10-CM | POA: Diagnosis not present

## 2016-09-26 DIAGNOSIS — Z8673 Personal history of transient ischemic attack (TIA), and cerebral infarction without residual deficits: Secondary | ICD-10-CM | POA: Diagnosis not present

## 2016-09-26 DIAGNOSIS — I10 Essential (primary) hypertension: Secondary | ICD-10-CM | POA: Diagnosis not present

## 2016-09-26 DIAGNOSIS — I2510 Atherosclerotic heart disease of native coronary artery without angina pectoris: Secondary | ICD-10-CM | POA: Diagnosis not present

## 2016-09-26 LAB — VAS US CAROTID
LCCAPSYS: 97 cm/s
LICADDIAS: -29 cm/s
LICADSYS: -85 cm/s
Left CCA dist dias: -19 cm/s
Left CCA dist sys: -73 cm/s
Left CCA prox dias: 24 cm/s
Left ICA prox dias: 20 cm/s
Left ICA prox sys: 82 cm/s
RCCADSYS: -58 cm/s
RIGHT CCA MID DIAS: -19 cm/s
RIGHT ECA DIAS: -11 cm/s
RIGHT VERTEBRAL DIAS: 15 cm/s

## 2016-09-26 NOTE — Progress Notes (Signed)
[facility] [place] HISTORY AND PHYSICAL   CC: Carotid duplex follow up  History of Present Illness:   Raymond Morris is a 83 y.o. male patient that Dr. Donnetta Hutching has been following for his small infrarenal abdominal aortic aneurysm. He had an ultrasound from an outlying facility showing a 3.5 cm abdominal aortic aneurysm.   He returns today for already scheduled carotid duplex.   He states he has occasional constipation.  He had an umbilical hernia repair several years ago, and he occasionally has pain at this site. He had a colonoscopy in August 2017, states several benign polyps were removed.  He had CVA by MRA of head in 2002, but no stroke symptoms as far as patient knows.  Patient has not had previous CEA. Has chronic low back pain, was advised years ago that he has DDD, describes what sounds like bilateral sciatic pain with walking. He does not walk much due to this. Pt reports that this pain is getting worse over time, but denies any new back pain.   Pt Diabetic: No Pt smoker: former smoker, quit 1978  Pt meds include: Statin : Yes Betablocker: Yes ASA: Yes Other anticoagulants/antiplatelets: no   Current Outpatient Prescriptions  Medication Sig Dispense Refill  . ADVAIR DISKUS 100-50 MCG/DOSE AEPB Inhale 1 puff into the lungs daily as needed (shortness of breath).     Marland Kitchen amLODipine (NORVASC) 10 MG tablet     . aspirin EC 81 MG tablet Take 81 mg by mouth daily.      . carvedilol (COREG) 3.125 MG tablet     . cetirizine (ZYRTEC) 10 MG chewable tablet Chew 10 mg by mouth daily.      Marland Kitchen escitalopram (LEXAPRO) 20 MG tablet Take 20 mg by mouth daily. 1/2 -1 tablet once daily    . esomeprazole (NEXIUM) 20 MG capsule Take 20 mg by mouth daily before breakfast.     . omega-3 acid ethyl esters (LOVAZA) 1 G capsule Take 1 g by mouth daily. Take 2 capsules daily    . rosuvastatin (CRESTOR) 20 MG tablet Take 20 mg by mouth daily.     . Tamsulosin HCl  (FLOMAX) 0.4 MG CAPS Take 0.4 mg by mouth daily.       No current facility-administered medications for this visit.     Past Medical History:  Diagnosis Date  . AAA (abdominal aortic aneurysm) ([place])   . AAA (abdominal aortic aneurysm) ([place])   . Anxiety   . Arthritis    "thumbs; hands" (10/08/2013)  . Asthma   . Carotid artery occlusion   . Cellulitis of right foot 10/2013  . Chronic bronchitis ([place])    "just about q yr"  . Chronic lower back pain   . COPD (chronic obstructive pulmonary disease) ([place])   . CVA (cerebral infarction)    per MRA done 2002  . Depression   . Diverticulosis   . Edema    chronic, lower extremities  . GERD (gastroesophageal reflux disease)   . Gout    11/2013 surgeries  . Hiatal hernia   . Hypercholesterolemia   . Hyperlipidemia   . Hypertension   . Monoclonal gammopathy    DR Marin Olp, 2007,felt to be benign  . Phlebitis    right leg  . Pneumonia "9 times"   (10/08/2013)  . Stroke [facility]) 2002   "didn't know I'd had one; just told me about it in 2015"  . Upper respiratory infection    "I've had  lots"  . Urinary frequency    elevated PSA , with prostate nodule, Dr [id number]    Social History Social History  Substance Use Topics  . Smoking status: Former Smoker    Packs/day: 0.50    Years: 28.00    Types: Cigarettes    Quit date: 09/07/1976  . Smokeless tobacco: Never Used  . Alcohol use No    Family History Family History  Problem Relation Age of Onset  . Cancer Mother   . Cancer Father   . Stroke Sister   . Liver disease Sister   . Pneumonia Daughter        pnemoccoal, deceased  . Cerebral palsy Son     Surgical History Past Surgical History:  Procedure Laterality Date  . APPENDECTOMY    . COLONOSCOPY N/A 08/13/2015   Procedure: COLONOSCOPY;  Surgeon: Carol Ada, MD;  Location: [facility];  Service: Endoscopy;  Laterality: N/A;  . EXCISIONAL HEMORRHOIDECTOMY    . HERNIA REPAIR    . UMBILICAL HERNIA REPAIR    .  VASECTOMY      Allergies  Allergen Reactions  . Ceclor [Cefaclor] Other (See Comments)    Causes muscle spasms  . Bactrim [Sulfamethoxazole-Trimethoprim]   . Sulfa Antibiotics Rash    Current Outpatient Prescriptions  Medication Sig Dispense Refill  . ADVAIR DISKUS 100-50 MCG/DOSE AEPB Inhale 1 puff into the lungs daily as needed (shortness of breath).     Marland Kitchen amLODipine (NORVASC) 10 MG tablet     . aspirin EC 81 MG tablet Take 81 mg by mouth daily.      . carvedilol (COREG) 3.125 MG tablet     . cetirizine (ZYRTEC) 10 MG chewable tablet Chew 10 mg by mouth daily.      Marland Kitchen escitalopram (LEXAPRO) 20 MG tablet Take 20 mg by mouth daily. 1/2 -1 tablet once daily    . esomeprazole (NEXIUM) 20 MG capsule Take 20 mg by mouth daily before breakfast.     . omega-3 acid ethyl esters (LOVAZA) 1 G capsule Take 1 g by mouth daily. Take 2 capsules daily    . rosuvastatin (CRESTOR) 20 MG tablet Take 20 mg by mouth daily.     . Tamsulosin HCl (FLOMAX) 0.4 MG CAPS Take 0.4 mg by mouth daily.       No current facility-administered medications for this visit.      REVIEW OF SYSTEMS: See HPI for pertinent positives and negatives.  Physical Examination Vitals:   09/26/16 1007 09/26/16 1010  BP: 135/84 (!) 143/82  Pulse: (!) 51   Resp: 20   Temp: (!) 97.3 F (36.3 C)   TempSrc: Oral   SpO2: 92%   Weight: 243 lb (110.2 kg)   Height: 5\' 9"  (1.753 m)    Body mass index is 35.88 kg/m.  General:  WDWN obese male in NAD. GAIT:antalgic Eyes: PERRLA Pulmonary: Respirations are non labored, CTAB, no rales,rhonchi, or wheezing.  Cardiac: regular rhythm, mild bradycardia (on a beta blocker), no detected murmur.  VASCULAR EXAM Carotid Bruits Left Right   Negative Negative   Abdominal aortic pulse is not palpable.  Radial pulses are 2+ palpable bilaterally.   LE Pulses  LEFT  RIGHT   FEMORAL Not palpable (obese) not palpable (obese)  POPLITEAL  2+ palpable  2+ palpable   POSTERIOR TIBIAL  2+palpable  2+palpable   DORSALIS PEDIS ANTERIOR TIBIAL  2+palpable  2+palpable     Gastrointestinal: softly obese, NTND, -G/R, - HSM, - CVAT B. Asymptomatic  ventral hernia apparent only when pt. is in the process of lying down or sitting up from supine position. Large panus.  Musculoskeletal: M/S 5/5 throughout, Extremities without ischemic changes. No peripheral edema.   Neurologic: CN 2-12 intact except is hard of hearing, Pain and light touch intact in extremities, Motor exam as listed above     ASSESSMENT:  JEFERSON BOOZER is a 83 y.o. male who has a hx of CVA not detected by pt, but was later found on brain imaging.  He has a history of a stroke in 2002, as pt states was apparent on an MRA of his head, but he does not recall any stroke or TIA symptoms.  2014 carotid duplex shows <40% right ICA stenosis and normal left ICA.   We have also been monitoring his small infrarenal abdominal aortic aneurysm. He had an ultrasound from an outlying facility showing a 3.5 cm abdominal aortic aneurysm.   He returns today for already scheduled carotid duplex.   He has no claudication symptoms with walking, bilateral pedal pulses are 2+ palpable. He has prominent popliteal pulses.    DATA Carotid Duplex (09/26/16); No significant stenosis of the bilateral ECA or CCA. 1-39% stenosis of the bilateral ICA.  Bilateral vertebral artery flow is antegrade.  Bilateral subclavian artery waveforms are normal.  No significant change compared to the last exam on 11-04-12.    AAA Duplex (07/17/2016):  Previous size: 3.3 cm (Date: 09-25-14), Right CIA: 1.3 cm; Left CIA: 1.3 cm  Current size:  3.6 cm (Date: 07/17/16); Right CIA: 1.8 cm; Left CIA: 1.7 cm    PLAN:    Based on this patient's exam and diagnostic studies, the patient will follow up in July 2019 with the following studies: AAA duplex and bilateral popliteal artery duplex to evaluate prominent  popliteal pulses as already advised at his July 2018 visit, and 2 years after that for carotid duplex.    Consideration for repair of AAA would be made when the size is 5.0 cm, growth > 1 cm/yr, and symptomatic status.  The patient was given information about AAA including signs, symptoms, treatment,  what symptoms should prompt the patient to seek immediate medical care, and how to minimize the risk of enlargement and rupture of aneurysms.   The patient was given information about stroke prevention and what symptoms should prompt the patient to seek immediate medical care.  Thank you for allowing [place] to participate in this patient's care.  Clemon Chambers, RN, MSN, FNP-C [facility] Office: [phone]  Clinic MD: Early 09/26/2016 10:38 AM

## 2016-09-26 NOTE — Patient Instructions (Signed)
Stroke Prevention Some medical conditions and behaviors are associated with an increased chance of having a stroke. You may prevent a stroke by making healthy choices and managing medical conditions. How can I reduce my risk of having a stroke?  Stay physically active. Get at least 30 minutes of activity on most or all days.  Do not smoke. It may also be helpful to avoid exposure to secondhand smoke.  Limit alcohol use. Moderate alcohol use is considered to be:  No more than 2 drinks per day for men.  No more than 1 drink per day for nonpregnant women.  Eat healthy foods. This involves:  Eating 5 or more servings of fruits and vegetables a day.  Making dietary changes that address high blood pressure (hypertension), high cholesterol, diabetes, or obesity.  Manage your cholesterol levels.  Making food choices that are high in fiber and low in saturated fat, trans fat, and cholesterol may control cholesterol levels.  Take any prescribed medicines to control cholesterol as directed by your health care provider.  Manage your diabetes.  Controlling your carbohydrate and sugar intake is recommended to manage diabetes.  Take any prescribed medicines to control diabetes as directed by your health care provider.  Control your hypertension.  Making food choices that are low in salt (sodium), saturated fat, trans fat, and cholesterol is recommended to manage hypertension.  Ask your health care provider if you need treatment to lower your blood pressure. Take any prescribed medicines to control hypertension as directed by your health care provider.  If you are 18-39 years of age, have your blood pressure checked every 3-5 years. If you are 40 years of age or older, have your blood pressure checked every year.  Maintain a healthy weight.  Reducing calorie intake and making food choices that are low in sodium, saturated fat, trans fat, and cholesterol are recommended to manage  weight.  Stop drug abuse.  Avoid taking birth control pills.  Talk to your health care provider about the risks of taking birth control pills if you are over 35 years old, smoke, get migraines, or have ever had a blood clot.  Get evaluated for sleep disorders (sleep apnea).  Talk to your health care provider about getting a sleep evaluation if you snore a lot or have excessive sleepiness.  Take medicines only as directed by your health care provider.  For some people, aspirin or blood thinners (anticoagulants) are helpful in reducing the risk of forming abnormal blood clots that can lead to stroke. If you have the irregular heart rhythm of atrial fibrillation, you should be on a blood thinner unless there is a good reason you cannot take them.  Understand all your medicine instructions.  Make sure that other conditions (such as anemia or atherosclerosis) are addressed. Get help right away if:  You have sudden weakness or numbness of the face, arm, or leg, especially on one side of the body.  Your face or eyelid droops to one side.  You have sudden confusion.  You have trouble speaking (aphasia) or understanding.  You have sudden trouble seeing in one or both eyes.  You have sudden trouble walking.  You have dizziness.  You have a loss of balance or coordination.  You have a sudden, severe headache with no known cause.  You have new chest pain or an irregular heartbeat. Any of these symptoms may represent a serious problem that is an emergency. Do not wait to see if the symptoms will go away.   Get medical help at once. Call your local emergency services (911 in [place] Do not drive yourself to the hospital. This information is not intended to replace advice given to you by your health care provider. Make sure you discuss any questions you have with your health care provider. Document Released: 01/27/2004 Document Revised: 05/27/2015 Document Reviewed: 06/21/2012 Elsevier  Interactive Patient Education  2017 Elsevier Inc.  

## 2016-10-31 ENCOUNTER — Other Ambulatory Visit: Payer: Self-pay | Admitting: Pharmacy Technician

## 2016-10-31 NOTE — Patient Outreach (Signed)
[facility]) Care Management  10/31/2016  Raymond Morris 07-02-1931 [id number]   Contacted patient in regards to Rosuvastatin 20mg  medication adherence. No answer, Left HIPAA  appropriate voicemail.  Maud Deed Mil[place], [facility] [phone]

## 2016-11-06 DIAGNOSIS — Z23 Encounter for immunization: Secondary | ICD-10-CM | POA: Diagnosis not present

## 2016-11-06 DIAGNOSIS — R0602 Shortness of breath: Secondary | ICD-10-CM | POA: Diagnosis not present

## 2016-11-27 DIAGNOSIS — H3589 Other specified retinal disorders: Secondary | ICD-10-CM | POA: Diagnosis not present

## 2016-11-27 DIAGNOSIS — I10 Essential (primary) hypertension: Secondary | ICD-10-CM | POA: Diagnosis not present

## 2016-11-27 DIAGNOSIS — H5203 Hypermetropia, bilateral: Secondary | ICD-10-CM | POA: Diagnosis not present

## 2016-11-27 DIAGNOSIS — H04123 Dry eye syndrome of bilateral lacrimal glands: Secondary | ICD-10-CM | POA: Diagnosis not present

## 2016-11-27 DIAGNOSIS — H52223 Regular astigmatism, bilateral: Secondary | ICD-10-CM | POA: Diagnosis not present

## 2016-11-27 DIAGNOSIS — H2513 Age-related nuclear cataract, bilateral: Secondary | ICD-10-CM | POA: Diagnosis not present

## 2016-11-27 DIAGNOSIS — H524 Presbyopia: Secondary | ICD-10-CM | POA: Diagnosis not present

## 2016-11-27 DIAGNOSIS — H35033 Hypertensive retinopathy, bilateral: Secondary | ICD-10-CM | POA: Diagnosis not present

## 2016-11-27 DIAGNOSIS — H40013 Open angle with borderline findings, low risk, bilateral: Secondary | ICD-10-CM | POA: Diagnosis not present

## 2016-12-28 DIAGNOSIS — R600 Localized edema: Secondary | ICD-10-CM | POA: Diagnosis not present

## 2016-12-28 DIAGNOSIS — I10 Essential (primary) hypertension: Secondary | ICD-10-CM | POA: Diagnosis not present

## 2017-01-18 DIAGNOSIS — I10 Essential (primary) hypertension: Secondary | ICD-10-CM | POA: Diagnosis not present

## 2017-01-29 DIAGNOSIS — I10 Essential (primary) hypertension: Secondary | ICD-10-CM | POA: Diagnosis not present

## 2017-01-29 DIAGNOSIS — H04203 Unspecified epiphora, bilateral lacrimal glands: Secondary | ICD-10-CM | POA: Diagnosis not present

## 2017-01-31 DIAGNOSIS — I10 Essential (primary) hypertension: Secondary | ICD-10-CM | POA: Diagnosis not present

## 2017-02-05 DIAGNOSIS — H0100A Unspecified blepharitis right eye, upper and lower eyelids: Secondary | ICD-10-CM | POA: Diagnosis not present

## 2017-02-05 DIAGNOSIS — H02102 Unspecified ectropion of right lower eyelid: Secondary | ICD-10-CM | POA: Diagnosis not present

## 2017-02-05 DIAGNOSIS — H04223 Epiphora due to insufficient drainage, bilateral lacrimal glands: Secondary | ICD-10-CM | POA: Diagnosis not present

## 2017-02-05 DIAGNOSIS — H25013 Cortical age-related cataract, bilateral: Secondary | ICD-10-CM | POA: Diagnosis not present

## 2017-02-15 DIAGNOSIS — I10 Essential (primary) hypertension: Secondary | ICD-10-CM | POA: Diagnosis not present

## 2017-02-15 DIAGNOSIS — M109 Gout, unspecified: Secondary | ICD-10-CM | POA: Diagnosis not present

## 2017-03-02 DIAGNOSIS — I10 Essential (primary) hypertension: Secondary | ICD-10-CM | POA: Diagnosis not present

## 2017-03-06 DIAGNOSIS — M7661 Achilles tendinitis, right leg: Secondary | ICD-10-CM | POA: Diagnosis not present

## 2017-03-06 DIAGNOSIS — K219 Gastro-esophageal reflux disease without esophagitis: Secondary | ICD-10-CM | POA: Diagnosis not present

## 2017-03-06 DIAGNOSIS — M1A2790 Drug-induced chronic gout, unspecified ankle and foot, without tophus (tophi): Secondary | ICD-10-CM | POA: Diagnosis not present

## 2017-03-06 DIAGNOSIS — E7800 Pure hypercholesterolemia, unspecified: Secondary | ICD-10-CM | POA: Diagnosis not present

## 2017-03-06 DIAGNOSIS — J45909 Unspecified asthma, uncomplicated: Secondary | ICD-10-CM | POA: Diagnosis not present

## 2017-03-06 DIAGNOSIS — F339 Major depressive disorder, recurrent, unspecified: Secondary | ICD-10-CM | POA: Diagnosis not present

## 2017-03-06 DIAGNOSIS — R600 Localized edema: Secondary | ICD-10-CM | POA: Diagnosis not present

## 2017-03-06 DIAGNOSIS — Z0001 Encounter for general adult medical examination with abnormal findings: Secondary | ICD-10-CM | POA: Diagnosis not present

## 2017-03-06 DIAGNOSIS — M109 Gout, unspecified: Secondary | ICD-10-CM | POA: Diagnosis not present

## 2017-03-15 DIAGNOSIS — E669 Obesity, unspecified: Secondary | ICD-10-CM | POA: Diagnosis not present

## 2017-03-15 DIAGNOSIS — I10 Essential (primary) hypertension: Secondary | ICD-10-CM | POA: Diagnosis not present

## 2017-03-21 DIAGNOSIS — D225 Melanocytic nevi of trunk: Secondary | ICD-10-CM | POA: Diagnosis not present

## 2017-03-21 DIAGNOSIS — L820 Inflamed seborrheic keratosis: Secondary | ICD-10-CM | POA: Diagnosis not present

## 2017-05-10 DIAGNOSIS — E669 Obesity, unspecified: Secondary | ICD-10-CM | POA: Diagnosis not present

## 2017-05-10 DIAGNOSIS — R7303 Prediabetes: Secondary | ICD-10-CM | POA: Diagnosis not present

## 2017-05-10 DIAGNOSIS — I10 Essential (primary) hypertension: Secondary | ICD-10-CM | POA: Diagnosis not present

## 2017-07-02 DIAGNOSIS — N401 Enlarged prostate with lower urinary tract symptoms: Secondary | ICD-10-CM | POA: Diagnosis not present

## 2017-07-02 DIAGNOSIS — R972 Elevated prostate specific antigen [PSA]: Secondary | ICD-10-CM | POA: Diagnosis not present

## 2017-07-02 DIAGNOSIS — R351 Nocturia: Secondary | ICD-10-CM | POA: Diagnosis not present

## 2017-07-02 DIAGNOSIS — N402 Nodular prostate without lower urinary tract symptoms: Secondary | ICD-10-CM | POA: Diagnosis not present

## 2017-07-02 DIAGNOSIS — R3915 Urgency of urination: Secondary | ICD-10-CM | POA: Diagnosis not present

## 2017-07-06 ENCOUNTER — Other Ambulatory Visit: Payer: Self-pay

## 2017-07-06 DIAGNOSIS — R0989 Other specified symptoms and signs involving the circulatory and respiratory systems: Secondary | ICD-10-CM

## 2017-07-06 DIAGNOSIS — I714 Abdominal aortic aneurysm, without rupture, unspecified: Secondary | ICD-10-CM

## 2017-07-24 ENCOUNTER — Ambulatory Visit (HOSPITAL_COMMUNITY)
Admission: RE | Admit: 2017-07-24 | Discharge: 2017-07-24 | Disposition: A | Discharge status: Home / Self Care | Payer: Medicare HMO | Source: Ambulatory Visit | Attending: Family | Admitting: Family

## 2017-07-24 ENCOUNTER — Other Ambulatory Visit: Payer: Self-pay

## 2017-07-24 ENCOUNTER — Ambulatory Visit (INDEPENDENT_AMBULATORY_CARE_PROVIDER_SITE_OTHER): Payer: Medicare HMO | Admitting: Family

## 2017-07-24 ENCOUNTER — Encounter: Payer: Self-pay | Admitting: Family

## 2017-07-24 ENCOUNTER — Ambulatory Visit (INDEPENDENT_AMBULATORY_CARE_PROVIDER_SITE_OTHER)
Admission: RE | Admit: 2017-07-24 | Discharge: 2017-07-24 | Disposition: A | Discharge status: Home / Self Care | Payer: Medicare HMO | Source: Ambulatory Visit | Attending: Family | Admitting: Family

## 2017-07-24 VITALS — BP 157/80 | HR 48 | Temp 97.3°F | Resp 16 | Ht 69.0 in | Wt 235.8 lb

## 2017-07-24 DIAGNOSIS — R0989 Other specified symptoms and signs involving the circulatory and respiratory systems: Secondary | ICD-10-CM

## 2017-07-24 DIAGNOSIS — I714 Abdominal aortic aneurysm, without rupture, unspecified: Secondary | ICD-10-CM

## 2017-07-24 DIAGNOSIS — I6523 Occlusion and stenosis of bilateral carotid arteries: Secondary | ICD-10-CM | POA: Diagnosis not present

## 2017-07-24 DIAGNOSIS — Z8673 Personal history of transient ischemic attack (TIA), and cerebral infarction without residual deficits: Secondary | ICD-10-CM

## 2017-07-24 NOTE — Patient Instructions (Signed)
Before your next abdominal ultrasound:  Avoid gas forming foods and beverages the day before the test.   Take two Extra-Strength Gas-X capsules at bedtime the night before the test. Take another two Extra-Strength Gas-X capsules in the middle of the night if you get up to the restroom, if not, first thing in the morning with water.       Abdominal Aortic Aneurysm Blood pumps away from the heart through tubes (blood vessels) called arteries. Aneurysms are weak or damaged places in the wall of an artery. It bulges out like a balloon. An abdominal aortic aneurysm happens in the main artery of the body (aorta). It can burst or tear, causing bleeding inside the body. This is an emergency. It needs treatment right away. What are the causes? The exact cause is unknown. Things that could cause this problem include:  Fat and other substances building up in the lining of a tube.  Swelling of the walls of a blood vessel.  Certain tissue diseases.  Belly (abdominal) trauma.  An infection in the main artery of the body.  What increases the risk? There are things that make it more likely for you to have an aneurysm. These include:  Being over the age of 82 years old.  Having high blood pressure (hypertension).  Being a male.  Being white.  Being very overweight (obese).  Having a family history of aneurysm.  Using tobacco products.  What are the signs or symptoms? Symptoms depend on the size of the aneurysm and how fast it grows. There may not be symptoms. If symptoms occur, they can include:  Pain (belly, side, lower back, or groin).  Feeling full after eating a small amount of food.  Feeling sick to your stomach (nauseous), throwing up (vomiting), or both.  Feeling a lump in your belly that feels like it is beating (pulsating).  Feeling like you will pass out (faint).  How is this treated?  Medicine to control blood pressure and pain.  Imaging tests to see if the  aneurysm gets bigger.  Surgery. How is this prevented? To lessen your chance of getting this condition:  Stop smoking. Stop chewing tobacco.  Limit or avoid alcohol.  Keep your blood pressure, blood sugar, and cholesterol within normal limits.  Eat less salt.  Eat foods low in saturated fats and cholesterol. These are found in animal and whole dairy products.  Eat more fiber. Fiber is found in whole grains, vegetables, and fruits.  Keep a healthy weight.  Stay active and exercise often.  This information is not intended to replace advice given to you by your health care provider. Make sure you discuss any questions you have with your health care provider. Document Released: 04/15/2012 Document Revised: 05/27/2015 Document Reviewed: 01/19/2012 Elsevier Interactive Patient Education  2017 [employer].

## 2017-07-24 NOTE — Progress Notes (Signed)
[facility] [place]   CC: Follow up Abdominal Aortic Aneurysm, prominent popliteal pulses    History of Present Illness  Raymond Morris is a 84 y.o. (05-06-1931) male whom Dr. Donnetta Hutching has been following for his small infrarenal abdominal aortic aneurysm. He had an ultrasound from an outlying facility showing a 3.5 cm abdominal aortic aneurysm.   He states he has occasional constipation.  He had an umbilical hernia repair several years ago, and he occasionally has pain at this site.  He had CVA confirmed by MRA of head in 2002, but no stroke symptoms as far as patient knows.  Patient has not had previous carotid artery intervention.  Has chronic low back pain, was advised years ago that he has DDD, describes what sounds like bilateral sciatic pain with walking. He does not walk much due to this. Pt reports that this pain is getting worse over time, but denies any new back pain.  On his colonoscopy in August 2017 he states 9 benign polyps were removed, states he has had intestinal problems since then, constipation alternating with loose stools.   He states he is working with his PCP to get his blood pressure under better control.  He states his blood pressure at home is about 140's/70's.    He has seen Dr. Wynonia Lawman in the past, indicates he has not seen him in a while, no documented cardiac problems.    Diabetic: No Tobacco use: former smoker, quit 1978  Pt meds include: Statin : Yes Betablocker: Yes ASA: Yes Other anticoagulants/antiplatelets: no    Past Medical History:  Diagnosis Date  . AAA (abdominal aortic aneurysm) ([place])   . AAA (abdominal aortic aneurysm) ([place])   . Anxiety   . Arthritis    "thumbs; hands" (10/08/2013)  . Asthma   . Carotid artery occlusion   . Cellulitis of right foot 10/2013  . Chronic bronchitis ([place])    "just about q yr"  . Chronic lower back pain   . COPD (chronic obstructive pulmonary disease) ([place])   . CVA (cerebral  infarction)    per MRA done 2002  . Depression   . Diverticulosis   . Edema    chronic, lower extremities  . GERD (gastroesophageal reflux disease)   . Gout    11/2013 surgeries  . Hiatal hernia   . Hypercholesterolemia   . Hyperlipidemia   . Hypertension   . Monoclonal gammopathy    DR Marin Olp, 2007,felt to be benign  . Phlebitis    right leg  . Pneumonia "9 times"   (10/08/2013)  . Stroke [facility]) 2002   "didn't know I'd had one; just told me about it in 2015"  . Upper respiratory infection    "I've had lots"  . Urinary frequency    elevated PSA , with prostate nodule, Dr [id number]   Past Surgical History:  Procedure Laterality Date  . APPENDECTOMY    . COLONOSCOPY N/A 08/13/2015   Procedure: COLONOSCOPY;  Surgeon: Carol Ada, MD;  Location: [facility];  Service: Endoscopy;  Laterality: N/A;  . EXCISIONAL HEMORRHOIDECTOMY    . HERNIA REPAIR    . UMBILICAL HERNIA REPAIR    . VASECTOMY     Social History Social History   Socioeconomic History  . Marital status: Widowed    Spouse name: Not on file  . Number of children: Not on file  . Years of education: Not on file  . Highest education level: Not on file  Occupational History  .  Not on file  Social Needs  . Financial resource strain: Not on file  . Food insecurity:    Worry: Not on file    Inability: Not on file  . Transportation needs:    Medical: Not on file    Non-medical: Not on file  Tobacco Use  . Smoking status: Former Smoker    Packs/day: 0.50    Years: 28.00    Pack years: 14.00    Types: Cigarettes    Last attempt to quit: 09/07/1976    Years since quitting: 40.9  . Smokeless tobacco: Never Used  Substance and Sexual Activity  . Alcohol use: No    Alcohol/week: 0.0 oz  . Drug use: No  . Sexual activity: Not on file  Lifestyle  . Physical activity:    Days per week: Not on file    Minutes per session: Not on file  . Stress: Not on file  Relationships  . Social connections:    Talks on  phone: Not on file    Gets together: Not on file    Attends religious service: Not on file    Active member of club or organization: Not on file    Attends meetings of clubs or organizations: Not on file    Relationship status: Not on file  . Intimate partner violence:    Fear of current or ex partner: Not on file    Emotionally abused: Not on file    Physically abused: Not on file    Forced sexual activity: Not on file  Other Topics Concern  . Not on file  Social History Narrative  . Not on file   Family History Family History  Problem Relation Age of Onset  . Cancer Mother   . Cancer Father   . Stroke Sister   . Liver disease Sister   . Pneumonia Daughter        pnemoccoal, deceased  . Cerebral palsy Son     Current Outpatient Medications on File Prior to Visit  Medication Sig Dispense Refill  . ADVAIR DISKUS 100-50 MCG/DOSE AEPB Inhale 1 puff into the lungs daily as needed (shortness of breath).     Marland Kitchen aspirin EC 81 MG tablet Take 81 mg by mouth daily.      . carvedilol (COREG) 3.125 MG tablet     . cetirizine (ZYRTEC) 10 MG chewable tablet Chew 10 mg by mouth daily.      Marland Kitchen escitalopram (LEXAPRO) 20 MG tablet Take 20 mg by mouth daily. 1/2 -1 tablet once daily    . omega-3 acid ethyl esters (LOVAZA) 1 G capsule Take 1 g by mouth daily. Take 2 capsules daily    . rosuvastatin (CRESTOR) 20 MG tablet Take 20 mg by mouth daily.     . Tamsulosin HCl (FLOMAX) 0.4 MG CAPS Take 0.4 mg by mouth daily.      Marland Kitchen amLODipine (NORVASC) 10 MG tablet     . esomeprazole (NEXIUM) 20 MG capsule Take 20 mg by mouth daily before breakfast.      No current facility-administered medications on file prior to visit.    Allergies  Allergen Reactions  . Ceclor [Cefaclor] Other (See Comments)    Causes muscle spasms  . Bactrim [Sulfamethoxazole-Trimethoprim]   . Sulfa Antibiotics Rash    ROS: See HPI for pertinent positives and negatives.  Physical Examination  Vitals:   07/24/17 0901  07/24/17 0903  BP: (!) 162/79 (!) 157/80  Pulse: (!) 48   Resp:  16   Temp: (!) 97.3 F (36.3 C)   TempSrc: Oral   SpO2: 96%   Weight: 235 lb 12.8 oz (107 kg)   Height: 5\' 9"  (1.753 m)    Body mass index is 34.82 kg/m.  General: A&O x 3, WD, obese male. HEENT: Grossly intact and WNL. PERRLA Pulmonary: Sym exp, respirations are non labored, good air movt, CTAB, no rales, rhonchi, or wheezing. Cardiac: Regular rhythm, bradycardic (on a beta blocker), no detected murmur.  Carotid Bruits Right Left   Negative Negative   Adominal aortic pulse is not palpable Radial pulses are 2+ palpable                          VASCULAR EXAM:                                                                                                         LE Pulses Right Left       FEMORAL  2+ palpable  2+ palpable        POPLITEAL  3+ palpable   2+ palpable       POSTERIOR TIBIAL  2+ palpable   3+ palpable        DORSALIS PEDIS      ANTERIOR TIBIAL 1+ palpable  1+ palpable     Gastrointestinal: soft, mild tenderness to palpation over umbilicus, -G/R, - HSM, - CVAT B. Asymptomatic ventral hernia apparent only when pt. is in the process of lying down or sitting up from supine position. Large panus. Musculoskeletal: M/S 5/5 throughout, Extremities without ischemic changes. Skin: No rashes, no ulcers, no cellulitis.   Neurologic: CN 2-12 intact except is very hard of hearing, Pain and light touch intact in extremities are intact, Motor exam as listed above. Psychiatric: Normal thought content, mood appropriate to clinical situation.    DATA  AAA Duplex (07/24/2017):  Previous size: 3.6cm (Date: 07/17/16); Right CIA: 1.8 cm; Left CIA: 1.7 cm   Current size:  3.7 cm (Date: 07-24-17); Right CIA: 1.6 cm; Left CIA: 1.8 cm   Bilateral Popliteal Artery Duplex (07-24-17): No popliteal artery aneurysms bilaterally   Carotid Duplex (09/26/16); No significant stenosis of the bilateral ECA or CCA. 1-39%  stenosis of the bilateral ICA.  Bilateral vertebral artery flow is antegrade.  Bilateral subclavian artery waveforms are normal.  No significant change compared to the last exam on 11-04-12.     Medical Decision Making  The patient is a 84 y.o. male who has a hx of CVA not detected by pt, but was later found on brain imaging.  He has a history of a stroke in 2002, as pt states was apparent on an MRA of his head, but he does not recall any stroke or TIA symptoms.  2014 carotid duplex shows <40% right ICA stenosis and normal left ICA.   We have also been monitoring his small infrarenal abdominal aortic aneurysm. He had an ultrasound from an outlying facility showing a 3.5 cm abdominal aortic aneurysm.   He has no  claudication symptoms with walking, bilateral pedal pulses are 2+ palpable. He has prominent popliteal pulses.     PLAN:   Based on this patient's exam and diagnostic studies, the patient will follow up in 1 year with the following studies: AAA duplex and  carotid duplex.    Consideration for repair of AAA would be made when the size is 5.0 cm, growth > 1 cm/yr, and symptomatic status.        The patient was given information about AAA including signs, symptoms, treatment,  and how to minimize the risk of enlargement and rupture of aneurysms.    I emphasized the importance of maximal medical management including strict control of blood pressure, blood glucose, and lipid levels, antiplatelet agents, obtaining regular exercise, and continued cessation of smoking.   The patient was advised to call 911 should the patient experience sudden onset abdominal or back pain.   Thank you for allowing [place] to participate in this patient's care.  Clemon Chambers, RN, MSN, FNP-C [facility] [place] Office: [phone]  Clinic Physician: Early  07/24/2017, 9:05 AM

## 2017-08-13 DIAGNOSIS — E669 Obesity, unspecified: Secondary | ICD-10-CM | POA: Diagnosis not present

## 2017-08-13 DIAGNOSIS — R7303 Prediabetes: Secondary | ICD-10-CM | POA: Diagnosis not present

## 2017-08-13 DIAGNOSIS — I10 Essential (primary) hypertension: Secondary | ICD-10-CM | POA: Diagnosis not present

## 2017-08-21 DIAGNOSIS — E669 Obesity, unspecified: Secondary | ICD-10-CM | POA: Diagnosis not present

## 2017-08-21 DIAGNOSIS — I10 Essential (primary) hypertension: Secondary | ICD-10-CM | POA: Diagnosis not present

## 2017-11-21 DIAGNOSIS — R7303 Prediabetes: Secondary | ICD-10-CM | POA: Diagnosis not present

## 2017-11-21 DIAGNOSIS — Z8673 Personal history of transient ischemic attack (TIA), and cerebral infarction without residual deficits: Secondary | ICD-10-CM | POA: Diagnosis not present

## 2017-11-21 DIAGNOSIS — I10 Essential (primary) hypertension: Secondary | ICD-10-CM | POA: Diagnosis not present

## 2017-11-21 DIAGNOSIS — E7800 Pure hypercholesterolemia, unspecified: Secondary | ICD-10-CM | POA: Diagnosis not present

## 2017-11-27 DIAGNOSIS — J984 Other disorders of lung: Secondary | ICD-10-CM | POA: Diagnosis not present

## 2017-11-27 DIAGNOSIS — J45909 Unspecified asthma, uncomplicated: Secondary | ICD-10-CM | POA: Diagnosis not present

## 2018-01-04 DIAGNOSIS — N401 Enlarged prostate with lower urinary tract symptoms: Secondary | ICD-10-CM | POA: Diagnosis not present

## 2018-01-04 DIAGNOSIS — N402 Nodular prostate without lower urinary tract symptoms: Secondary | ICD-10-CM | POA: Diagnosis not present

## 2018-01-04 DIAGNOSIS — R972 Elevated prostate specific antigen [PSA]: Secondary | ICD-10-CM | POA: Diagnosis not present

## 2018-01-04 DIAGNOSIS — R3915 Urgency of urination: Secondary | ICD-10-CM | POA: Diagnosis not present

## 2018-01-04 DIAGNOSIS — R351 Nocturia: Secondary | ICD-10-CM | POA: Diagnosis not present

## 2018-01-24 DIAGNOSIS — E7800 Pure hypercholesterolemia, unspecified: Secondary | ICD-10-CM | POA: Diagnosis not present

## 2018-01-24 DIAGNOSIS — R7303 Prediabetes: Secondary | ICD-10-CM | POA: Diagnosis not present

## 2018-01-24 DIAGNOSIS — Z8673 Personal history of transient ischemic attack (TIA), and cerebral infarction without residual deficits: Secondary | ICD-10-CM | POA: Diagnosis not present

## 2018-01-24 DIAGNOSIS — I10 Essential (primary) hypertension: Secondary | ICD-10-CM | POA: Diagnosis not present

## 2018-02-13 DIAGNOSIS — J45901 Unspecified asthma with (acute) exacerbation: Secondary | ICD-10-CM | POA: Diagnosis not present

## 2018-02-13 DIAGNOSIS — J069 Acute upper respiratory infection, unspecified: Secondary | ICD-10-CM | POA: Diagnosis not present

## 2018-02-21 DIAGNOSIS — E7800 Pure hypercholesterolemia, unspecified: Secondary | ICD-10-CM | POA: Diagnosis not present

## 2018-02-21 DIAGNOSIS — I10 Essential (primary) hypertension: Secondary | ICD-10-CM | POA: Diagnosis not present

## 2018-02-21 DIAGNOSIS — R7303 Prediabetes: Secondary | ICD-10-CM | POA: Diagnosis not present

## 2018-02-21 DIAGNOSIS — Z8673 Personal history of transient ischemic attack (TIA), and cerebral infarction without residual deficits: Secondary | ICD-10-CM | POA: Diagnosis not present

## 2018-03-07 DIAGNOSIS — I10 Essential (primary) hypertension: Secondary | ICD-10-CM | POA: Diagnosis not present

## 2018-03-07 DIAGNOSIS — E7800 Pure hypercholesterolemia, unspecified: Secondary | ICD-10-CM | POA: Diagnosis not present

## 2018-03-11 DIAGNOSIS — E7800 Pure hypercholesterolemia, unspecified: Secondary | ICD-10-CM | POA: Diagnosis not present

## 2018-03-11 DIAGNOSIS — R600 Localized edema: Secondary | ICD-10-CM | POA: Diagnosis not present

## 2018-03-11 DIAGNOSIS — Z23 Encounter for immunization: Secondary | ICD-10-CM | POA: Diagnosis not present

## 2018-03-11 DIAGNOSIS — Z0000 Encounter for general adult medical examination without abnormal findings: Secondary | ICD-10-CM | POA: Diagnosis not present

## 2018-03-11 DIAGNOSIS — M1A2790 Drug-induced chronic gout, unspecified ankle and foot, without tophus (tophi): Secondary | ICD-10-CM | POA: Diagnosis not present

## 2018-03-11 DIAGNOSIS — H9313 Tinnitus, bilateral: Secondary | ICD-10-CM | POA: Diagnosis not present

## 2018-03-11 DIAGNOSIS — F339 Major depressive disorder, recurrent, unspecified: Secondary | ICD-10-CM | POA: Diagnosis not present

## 2018-03-11 DIAGNOSIS — R202 Paresthesia of skin: Secondary | ICD-10-CM | POA: Diagnosis not present

## 2018-03-11 DIAGNOSIS — I10 Essential (primary) hypertension: Secondary | ICD-10-CM | POA: Diagnosis not present

## 2018-03-25 ENCOUNTER — Telehealth: Payer: Self-pay | Admitting: Cardiology

## 2018-03-25 NOTE — Telephone Encounter (Signed)
Called patient and he wishes to wait to schedule appointment due to the COVID-19 virus.

## 2018-03-27 ENCOUNTER — Institutional Professional Consult (permissible substitution): Payer: Medicare HMO | Admitting: Neurology

## 2018-04-03 DIAGNOSIS — I10 Essential (primary) hypertension: Secondary | ICD-10-CM | POA: Diagnosis not present

## 2018-05-20 ENCOUNTER — Institutional Professional Consult (permissible substitution): Payer: Medicare HMO | Admitting: Neurology

## 2018-06-24 ENCOUNTER — Telehealth: Payer: Self-pay | Admitting: Neurology

## 2018-06-24 NOTE — Addendum Note (Signed)
Addended by: Lester Lajas A on: 06/24/2018 12:05 PM   Modules accepted: Orders

## 2018-06-24 NOTE — Telephone Encounter (Signed)
Due to current COVID 19 pandemic, our office is severely reducing in office visits until further notice, in order to minimize the risk to our patients and healthcare providers.   I called patient's daughter and spoke with her about a virtual visit for patient for his 6/23 appt. She accepted a virtual visit for pt's appt via doxy. Patient is not set up on mychart. I have sent pt's daughter the link and info to her e-mail. ([email or web address]) She verbalized understanding.  Pt understands that although there may be some limitations with this type of visit, we will take all precautions to reduce any security or privacy concerns.  Pt understands that this will be treated like an in office visit and we will file with pt's insurance, and there may be a patient responsible charge related to this service.

## 2018-06-24 NOTE — Telephone Encounter (Signed)
I called pt. Pt's meds, allergies, and PMH were updated.  Pt wants to discuss pain/tingling in his toes and headaches. Pt's daughter will be assisting him with his virtual visit tomorrow.

## 2018-06-25 ENCOUNTER — Ambulatory Visit (INDEPENDENT_AMBULATORY_CARE_PROVIDER_SITE_OTHER): Payer: Medicare HMO | Admitting: Neurology

## 2018-06-25 ENCOUNTER — Other Ambulatory Visit: Payer: Self-pay

## 2018-06-25 ENCOUNTER — Encounter: Payer: Self-pay | Admitting: Neurology

## 2018-06-25 ENCOUNTER — Institutional Professional Consult (permissible substitution): Payer: Medicare HMO | Admitting: Neurology

## 2018-06-25 DIAGNOSIS — R200 Anesthesia of skin: Secondary | ICD-10-CM | POA: Diagnosis not present

## 2018-06-25 DIAGNOSIS — R202 Paresthesia of skin: Secondary | ICD-10-CM | POA: Diagnosis not present

## 2018-06-25 NOTE — Patient Instructions (Signed)
Given verbally, during today's virtual video-based encounter, with verbal feedback received.   
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
## 2018-07-09 DIAGNOSIS — Z7982 Long term (current) use of aspirin: Secondary | ICD-10-CM | POA: Diagnosis not present

## 2018-07-09 DIAGNOSIS — R7303 Prediabetes: Secondary | ICD-10-CM | POA: Diagnosis not present

## 2018-07-09 DIAGNOSIS — E7800 Pure hypercholesterolemia, unspecified: Secondary | ICD-10-CM | POA: Diagnosis not present

## 2018-07-09 DIAGNOSIS — I10 Essential (primary) hypertension: Secondary | ICD-10-CM | POA: Diagnosis not present

## 2018-08-15 ENCOUNTER — Institutional Professional Consult (permissible substitution): Payer: Medicare HMO | Admitting: Neurology

## 2018-09-25 DIAGNOSIS — Z7982 Long term (current) use of aspirin: Secondary | ICD-10-CM | POA: Diagnosis not present

## 2018-09-25 DIAGNOSIS — E7800 Pure hypercholesterolemia, unspecified: Secondary | ICD-10-CM | POA: Diagnosis not present

## 2018-09-25 DIAGNOSIS — I10 Essential (primary) hypertension: Secondary | ICD-10-CM | POA: Diagnosis not present

## 2018-09-25 DIAGNOSIS — R7303 Prediabetes: Secondary | ICD-10-CM | POA: Diagnosis not present

## 2018-10-03 DIAGNOSIS — F339 Major depressive disorder, recurrent, unspecified: Secondary | ICD-10-CM | POA: Diagnosis not present

## 2018-10-03 DIAGNOSIS — E7800 Pure hypercholesterolemia, unspecified: Secondary | ICD-10-CM | POA: Diagnosis not present

## 2018-10-03 DIAGNOSIS — Z8673 Personal history of transient ischemic attack (TIA), and cerebral infarction without residual deficits: Secondary | ICD-10-CM | POA: Diagnosis not present

## 2018-10-03 DIAGNOSIS — I10 Essential (primary) hypertension: Secondary | ICD-10-CM | POA: Diagnosis not present

## 2018-12-19 DIAGNOSIS — I10 Essential (primary) hypertension: Secondary | ICD-10-CM | POA: Diagnosis not present

## 2018-12-19 DIAGNOSIS — E7800 Pure hypercholesterolemia, unspecified: Secondary | ICD-10-CM | POA: Diagnosis not present

## 2018-12-19 DIAGNOSIS — Z8673 Personal history of transient ischemic attack (TIA), and cerebral infarction without residual deficits: Secondary | ICD-10-CM | POA: Diagnosis not present

## 2019-03-17 DIAGNOSIS — E7800 Pure hypercholesterolemia, unspecified: Secondary | ICD-10-CM | POA: Diagnosis not present

## 2019-03-17 DIAGNOSIS — I10 Essential (primary) hypertension: Secondary | ICD-10-CM | POA: Diagnosis not present

## 2019-03-24 DIAGNOSIS — H6123 Impacted cerumen, bilateral: Secondary | ICD-10-CM | POA: Diagnosis not present

## 2019-03-24 DIAGNOSIS — I440 Atrioventricular block, first degree: Secondary | ICD-10-CM | POA: Diagnosis not present

## 2019-03-24 DIAGNOSIS — K5900 Constipation, unspecified: Secondary | ICD-10-CM | POA: Diagnosis not present

## 2019-03-24 DIAGNOSIS — F339 Major depressive disorder, recurrent, unspecified: Secondary | ICD-10-CM | POA: Diagnosis not present

## 2019-03-24 DIAGNOSIS — Z1212 Encounter for screening for malignant neoplasm of rectum: Secondary | ICD-10-CM | POA: Diagnosis not present

## 2019-03-24 DIAGNOSIS — K219 Gastro-esophageal reflux disease without esophagitis: Secondary | ICD-10-CM | POA: Diagnosis not present

## 2019-03-24 DIAGNOSIS — Z0001 Encounter for general adult medical examination with abnormal findings: Secondary | ICD-10-CM | POA: Diagnosis not present

## 2019-04-02 DIAGNOSIS — R195 Other fecal abnormalities: Secondary | ICD-10-CM | POA: Diagnosis not present

## 2019-04-02 DIAGNOSIS — K5909 Other constipation: Secondary | ICD-10-CM | POA: Diagnosis not present

## 2019-04-21 DIAGNOSIS — I10 Essential (primary) hypertension: Secondary | ICD-10-CM | POA: Diagnosis not present

## 2019-04-23 DIAGNOSIS — K5909 Other constipation: Secondary | ICD-10-CM | POA: Diagnosis not present

## 2019-07-19 DIAGNOSIS — K5909 Other constipation: Secondary | ICD-10-CM | POA: Diagnosis not present

## 2019-07-22 DIAGNOSIS — E7800 Pure hypercholesterolemia, unspecified: Secondary | ICD-10-CM | POA: Diagnosis not present

## 2019-07-22 DIAGNOSIS — R7303 Prediabetes: Secondary | ICD-10-CM | POA: Diagnosis not present

## 2019-07-22 DIAGNOSIS — Z8673 Personal history of transient ischemic attack (TIA), and cerebral infarction without residual deficits: Secondary | ICD-10-CM | POA: Diagnosis not present

## 2019-07-22 DIAGNOSIS — I10 Essential (primary) hypertension: Secondary | ICD-10-CM | POA: Diagnosis not present

## 2019-11-04 DIAGNOSIS — E7800 Pure hypercholesterolemia, unspecified: Secondary | ICD-10-CM | POA: Diagnosis not present

## 2019-11-04 DIAGNOSIS — I10 Essential (primary) hypertension: Secondary | ICD-10-CM | POA: Diagnosis not present

## 2019-11-04 DIAGNOSIS — R7303 Prediabetes: Secondary | ICD-10-CM | POA: Diagnosis not present

## 2019-11-06 DIAGNOSIS — Z8673 Personal history of transient ischemic attack (TIA), and cerebral infarction without residual deficits: Secondary | ICD-10-CM | POA: Diagnosis not present

## 2019-11-06 DIAGNOSIS — I10 Essential (primary) hypertension: Secondary | ICD-10-CM | POA: Diagnosis not present

## 2019-11-06 DIAGNOSIS — E7800 Pure hypercholesterolemia, unspecified: Secondary | ICD-10-CM | POA: Diagnosis not present

## 2019-11-06 DIAGNOSIS — J45909 Unspecified asthma, uncomplicated: Secondary | ICD-10-CM | POA: Diagnosis not present

## 2019-11-07 ENCOUNTER — Telehealth: Payer: Self-pay | Admitting: *Deleted

## 2019-11-07 NOTE — Telephone Encounter (Signed)
Notes on file from Dr Shelia Media, [facility]. [phone]. Referral sent to scheduling.

## 2019-11-19 ENCOUNTER — Encounter: Payer: Self-pay | Admitting: *Deleted

## 2019-11-19 ENCOUNTER — Encounter: Payer: Self-pay | Admitting: Internal Medicine

## 2019-11-19 ENCOUNTER — Ambulatory Visit: Payer: Medicare HMO | Admitting: Internal Medicine

## 2019-11-19 VITALS — BP 132/82 | HR 56 | Ht 69.0 in | Wt 244.2 lb

## 2019-11-19 DIAGNOSIS — I10 Essential (primary) hypertension: Secondary | ICD-10-CM | POA: Diagnosis not present

## 2019-11-19 DIAGNOSIS — Z8673 Personal history of transient ischemic attack (TIA), and cerebral infarction without residual deficits: Secondary | ICD-10-CM

## 2019-11-19 DIAGNOSIS — R0600 Dyspnea, unspecified: Secondary | ICD-10-CM

## 2019-11-19 DIAGNOSIS — R0609 Other forms of dyspnea: Secondary | ICD-10-CM

## 2019-11-19 NOTE — Patient Instructions (Addendum)
Medication Instructions:  No Changes In Medications at this time.  *If you need a refill on your cardiac medications before your next appointment, please call your pharmacy*  Testing/Procedures: Your physician has requested that you have an echocardiogram. Echocardiography is a painless test that uses sound waves to create images of your heart. It provides your doctor with information about the size and shape of your heart and how well your heart's chambers and valves are working. You may receive an ultrasound enhancing agent through an IV if needed to better visualize your heart during the echo.This procedure takes approximately one hour. There are no restrictions for this procedure. This will take place at the [address], [address].   Your physician has recommended that you wear an event monitor. Event monitors are medical devices that record the heart's electrical activity. Doctors most often [place] these monitors to diagnose arrhythmias. Arrhythmias are problems with the speed or rhythm of the heartbeat. The monitor is a small, portable device. You can wear one while you do your normal daily activities. This is usually used to diagnose what is causing palpitations/syncope (passing out).  Follow-Up: At [facility], you and your health needs are our priority.  As part of our continuing mission to provide you with exceptional heart care, we have created designated Provider Care Teams.  These Care Teams include your primary Cardiologist (physician) and Advanced Practice Providers (APPs -  Physician Assistants and Nurse Practitioners) who all work together to provide you with the care you need, when you need it.  We recommend signing up for the patient portal called "MyChart".  Sign up information is provided on this After Visit Summary.  MyChart is used to connect with patients for Virtual Visits (Telemedicine).  Patients are able to view lab/test results, encounter notes, upcoming appointments,  etc.  Non-urgent messages can be sent to your provider as well.   To learn more about what you can do with MyChart, go to [email or web address].    Your next appointment:   After Echo  The format for your next appointment:   In Person  Provider:   You will see one of the following Advanced Practice Providers on your designated Care Team:    Rosaria Ferries, PA-C  Jory Sims, DNP, ANP  Other Instructions [employer] Cardiac Event Monitor Instructions Your physician has requested you wear your cardiac event monitor for 30 days, (1-30). [employer] may call or text to confirm a shipping address. The monitor will be sent to a land address via UPS. [employer] will not ship a monitor to a PO BOX. It typically takes 3-5 days to receive your monitor after it has been enrolled. [employer] will assist with USPS tracking if your package is delayed. The telephone number for [employer] is [phone]. Once you have received your monitor, please review the enclosed instructions. Instruction tutorials can also be viewed under help and settings on the enclosed cell phone. Your monitor has already been registered assigning a specific monitor serial # to you.  Applying the monitor Remove cell phone from case and turn it on. The cell phone works as Dealer and needs to be within [employer] of you at all times. The cell phone will need to be charged on a daily basis. We recommend you plug the cell phone into the enclosed charger at your bedside table every night.  Monitor batteries: You will receive two monitor batteries labelled #1 and #2. These are your recorders. Plug battery #2 onto the second  connection on the enclosed charger. Keep one battery on the charger at all times. This will keep the monitor battery deactivated. It will also keep it fully charged for when you need to switch your monitor batteries. A small light will be blinking on the battery emblem when it is charging. The  light on the battery emblem will remain on when the battery is fully charged.  Open package of a Monitor strip. Insert battery #1 into black hood on strip and gently squeeze monitor battery onto connection as indicated in instruction booklet. Set aside while preparing skin.  Choose location for your strip, vertical or horizontal, as indicated in the instruction booklet. Shave to remove all hair from location. There cannot be any lotions, oils, powders, or colognes on skin where monitor is to be applied. Wipe skin clean with enclosed Saline wipe. Dry skin completely.  Peel paper labeled #1 off the back of the Monitor strip exposing the adhesive. Place the monitor on the chest in the vertical or horizontal position shown in the instruction booklet. One arrow on the monitor strip must be pointing upward. Carefully remove paper labeled #2, attaching remainder of strip to your skin. Try not to create any folds or wrinkles in the strip as you apply it.  Firmly press and release the circle in the center of the monitor battery. You will hear a small beep. This is turning the monitor battery on. The heart emblem on the monitor battery will light up every 5 seconds if the monitor battery in turned on and connected to the patient securely. Do not push and hold the circle down as this turns the monitor battery off. The cell phone will locate the monitor battery. A screen will appear on the cell phone checking the connection of your monitor strip. This may read poor connection initially but change to good connection within the next minute. Once your monitor accepts the connection you will hear a series of 3 beeps followed by a climbing crescendo of beeps. A screen will appear on the cell phone showing the two monitor strip placement options. Touch the picture that demonstrates where you applied the monitor strip.  Your monitor strip and battery are waterproof. You are able to shower, bathe, or swim with  the monitor on. They just ask you do not submerge deeper than 3 feet underwater. We recommend removing the monitor if you are swimming in a lake, river, or ocean.  Your monitor battery will need to be switched to a fully charged monitor battery approximately once a week. The cell phone will alert you of an action which needs to be made.  On the cell phone, tap for details to reveal connection status, monitor battery status, and cell phone battery status. The green dots indicates your monitor is in good status. A red dot indicates there is something that needs your attention.  To record a symptom, click the circle on the monitor battery. In 30-60 seconds a list of symptoms will appear on the cell phone. Select your symptom and tap save. Your monitor will record a sustained or significant arrhythmia regardless of you clicking the button. Some patients do not feel the heart rhythm irregularities. [employer] will notify us of any serious or critical events.  Refer to instruction booklet for instructions on switching batteries, changing strips, the Do not disturb or Pause features, or any additional questions.  Call [employer] at [phone], to confirm your monitor is transmitting and record your baseline. They will answer any questions  you may have regarding the monitor instructions at that time.  Returning the monitor to [place] all equipment back into blue box. Peel off strip of paper to expose adhesive and close box securely. There is a prepaid UPS shipping label on this box. Drop in a UPS drop box, or at a UPS facility like [employer]. You may also contact [employer] to arrange UPS to pick up monitor package at your home.

## 2019-11-19 NOTE — Progress Notes (Signed)
Cardiology Office Note:    Date:  11/19/2019   ID:  Raymond Morris, DOB 1931/10/18, MRN [id number]  PCP:  Deland Pretty, MD  Cardiologist:  Elouise Munroe, MD  Electrophysiologist:  None   Referring MD: Deland Pretty, MD   Chief Complaint/Reason for Referral: SOB with exertion  History of Present Illness:    Raymond Morris is a 86 y.o. male with a history of HTN, HLD, chronic LE edema, CVA noted on MRA in 2002, AAA last imaged in 2019 and 3.7 cm felt to be stable at that time, monoglonal gammopathy noted in 2007, reports note that Hem/Onc Dr. Marin Olp felt it was benign. Presents for SOB on exertion. Daughter Sharyn Lull is present for visit and provides additional history.   Previously saw my college Dr. Tollie Eth. We do not have a copy of Dr. Thurman Coyer thorough records but will try to obtain this.  He has been evaluated for dyspnea in the past and has had stress nuclear study. This was abnormal and showed inferior ischemia. Report notes "the patient has reduced exercise tolerance and an abnormal EKG response to [low level] exercise. The perfusion defects are subtle but with clinical history suggest further evaluation for CAD."  Patient tells me the decided to pursue medical management and did not perform a cath at that time.   He notes getting short of breath at times, this is longstanding. Primarily with exertion No significant chest pain. Occasional palpitations. No syncope. Some LE swelling.  ADDENDUM: after our appointment, I received comprehensive records. Dr. 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 Given chronic symptoms, this was felt to be reasonable.    Past Medical History:  Diagnosis Date  . AAA (abdominal aortic aneurysm) ([place])   . AAA (abdominal aortic aneurysm) ([place])   . Anxiety   . Arthritis    "thumbs; hands"  (10/08/2013)  . Asthma   . Carotid artery occlusion   . Cellulitis of right foot 10/2013  . Chronic bronchitis ([place])    "just about q yr"  . Chronic lower back pain   . COPD (chronic obstructive pulmonary disease) ([place])   . CVA (cerebral infarction)    per MRA done 2002  . Depression   . Diverticulosis   . Edema    chronic, lower extremities  . GERD (gastroesophageal reflux disease)   . Gout    11/2013 surgeries  . Hiatal hernia   . Hypercholesterolemia   . Hyperlipidemia   . Hypertension   . Monoclonal gammopathy    DR Marin Olp, 2007,felt to be benign  . Phlebitis    right leg  . Pneumonia "9 times"   (10/08/2013)  . Stroke [facility]) 2002   "didn't know I'd had one; just told me about it in 2015"  . Upper respiratory infection    "I've had lots"  . Urinary frequency    elevated PSA , with prostate nodule, Dr [id number]    Past Surgical History:  Procedure Laterality Date  . APPENDECTOMY    . COLONOSCOPY N/A 08/13/2015   Procedure: COLONOSCOPY;  Surgeon: Carol Ada, MD;  Location: [facility];  Service: Endoscopy;  Laterality: N/A;  . EXCISIONAL HEMORRHOIDECTOMY    . HERNIA REPAIR    . UMBILICAL HERNIA REPAIR    . VASECTOMY      Current Medications: Current Meds  Medication Sig  . aspirin EC  81 MG tablet Take 81 mg by mouth daily.    . carvedilol (COREG) 12.5 MG tablet Take 12.5 mg by mouth 2 (two) times daily with a meal.  . cetirizine (ZYRTEC) 10 MG chewable tablet Chew 10 mg by mouth daily.    Marland Kitchen escitalopram (LEXAPRO) 20 MG tablet Take 20 mg by mouth daily. 1/2 -1 tablet once daily  . esomeprazole (NEXIUM) 20 MG capsule Take 20 mg by mouth daily before breakfast.   . Fluticasone-Salmeterol (WIXELA INHUB) 250-50 MCG/DOSE AEPB Inhale 1 puff into the lungs 2 (two) times daily.  Marland Kitchen omega-3 acid ethyl esters (LOVAZA) 1 G capsule Take 3 g by mouth daily. Take 2 capsules daily  . rosuvastatin (CRESTOR) 20 MG tablet Take 10 mg by mouth daily.   . Tamsulosin HCl (FLOMAX)  0.4 MG CAPS Take 0.4 mg by mouth daily.    . valsartan (DIOVAN) 320 MG tablet Take 320 mg by mouth daily.     Allergies:   Ceclor [cefaclor], Bactrim [sulfamethoxazole-trimethoprim], and Sulfa antibiotics   Social History   Tobacco Use  . Smoking status: Former Smoker    Packs/day: 0.50    Years: 28.00    Pack years: 14.00    Types: Cigarettes    Quit date: 09/07/1976    Years since quitting: 43.2  . Smokeless tobacco: Never Used  Substance Use Topics  . Alcohol use: No    Alcohol/week: 0.0 standard drinks  . Drug use: No     Family History: The patient's family history includes Cancer in his father and mother; Cerebral palsy in his son; Liver disease in his sister; Pneumonia in his daughter; Stroke in his sister.  ROS:   Please see the history of present illness.    All other systems reviewed and are negative.  EKGs/Labs/Other Studies Reviewed:    The following studies were reviewed today:  EKG:  SB first deg AVB, rate 56 bpm    Recent Labs: No results found for requested labs within last 8760 hours.  Recent Lipid Panel No results found for: CHOL, TRIG, HDL, CHOLHDL, VLDL, LDLCALC, LDLDIRECT  Physical Exam:    VS:  BP 132/82   Pulse (!) 56   Ht 5\' 9"  (1.753 m)   Wt 244 lb 3.2 oz (110.8 kg)   SpO2 96%   BMI 36.06 kg/m     Wt Readings from Last 5 Encounters:  11/19/19 244 lb 3.2 oz (110.8 kg)  07/24/17 235 lb 12.8 oz (107 kg)  09/26/16 243 lb (110.2 kg)  07/17/16 245 lb (111.1 kg)  01/06/16 245 lb (111.1 kg)    Constitutional: No acute distress Eyes: sclera non-icteric, normal conjunctiva and lids ENMT: normal dentition, moist mucous membranes Cardiovascular: regular rhythm, normal rate, no murmurs. S1 and S2 normal. Radial pulses normal bilaterally. No jugular venous distention.  Respiratory: clear to auscultation bilaterally GI : normal bowel sounds, soft and nontender. No distention.   MSK: extremities warm, well perfused. No edema.  NEURO: grossly  nonfocal exam, moves all extremities. PSYCH: alert and oriented x 3, normal mood and affect.   ASSESSMENT:    1. Dyspnea on exertion   2. Hypertension, unspecified type   3. History of stroke    PLAN:    Dyspnea on exertion - Plan: EKG 12-Lead, ECHOCARDIOGRAM COMPLETE  It has been some time from his last cardiovascular evaluation. We will review his SOB with echo. Needs strain with echo to reassess his history of monoclonal gammopathy, and need diastolic function fully assessed.  Hypertension, unspecified type - Plan: ECHOCARDIOGRAM COMPLETE -BP grossly within goal. Would continued carvedilol, and valsartan.   History of stroke - Plan: ECHOCARDIOGRAM COMPLETE, CARDIAC EVENT MONITOR - no prior evaluation for source of stroke, will screen for afib with event monitor.   Total time of encounter: 45 minutes total time of encounter, including 30 minutes spent in face-to-face patient care on the date of this encounter. This time includes coordination of care and counseling regarding above mentioned problem list. Remainder of non-face-to-face time involved reviewing chart documents/testing relevant to the patient encounter and documentation in the medical record. I have independently reviewed documentation from referring provider.   Cherlynn Kaiser, MD [place]  [facility]    Medication Adjustments/Labs and Tests Ordered: Current medicines are reviewed at length with the patient today.  Concerns regarding medicines are outlined above.   Orders Placed This Encounter  Procedures  . CARDIAC EVENT MONITOR  . EKG 12-Lead  . ECHOCARDIOGRAM COMPLETE    No orders of the defined types were placed in this encounter.   Patient Instructions  Medication Instructions:  No Changes In Medications at this time.  *If you need a refill on your cardiac medications before your next appointment, please call your pharmacy*  Testing/Procedures: Your physician has requested that you have an  echocardiogram. Echocardiography is a painless test that uses sound waves to create images of your heart. It provides your doctor with information about the size and shape of your heart and how well your heart's chambers and valves are working. You may receive an ultrasound enhancing agent through an IV if needed to better visualize your heart during the echo.This procedure takes approximately one hour. There are no restrictions for this procedure. This will take place at the [address], [address].   Your physician has recommended that you wear an event monitor. Event monitors are medical devices that record the heart's electrical activity. Doctors most often [place] these monitors to diagnose arrhythmias. Arrhythmias are problems with the speed or rhythm of the heartbeat. The monitor is a small, portable device. You can wear one while you do your normal daily activities. This is usually used to diagnose what is causing palpitations/syncope (passing out).  Follow-Up: At [facility], you and your health needs are our priority.  As part of our continuing mission to provide you with exceptional heart care, we have created designated Provider Care Teams.  These Care Teams include your primary Cardiologist (physician) and Advanced Practice Providers (APPs -  Physician Assistants and Nurse Practitioners) who all work together to provide you with the care you need, when you need it.  We recommend signing up for the patient portal called "MyChart".  Sign up information is provided on this After Visit Summary.  MyChart is used to connect with patients for Virtual Visits (Telemedicine).  Patients are able to view lab/test results, encounter notes, upcoming appointments, etc.  Non-urgent messages can be sent to your provider as well.   To learn more about what you can do with MyChart, go to [email or web address].    Your next appointment:   After Echo  The format for your next appointment:   In  Person  Provider:   You will see one of the following Advanced Practice Providers on your designated Care Team:    Rosaria Ferries, PA-C  Jory Sims, DNP, ANP  Other Instructions [employer] Cardiac Event Monitor Instructions Your physician has requested you wear your cardiac event monitor for 30 days, (1-30). [employer] may  call or text to confirm a shipping address. The monitor will be sent to a land address via UPS. [employer] will not ship a monitor to a PO BOX. It typically takes 3-5 days to receive your monitor after it has been enrolled. [employer] will assist with USPS tracking if your package is delayed. The telephone number for [employer] is [phone]. Once you have received your monitor, please review the enclosed instructions. Instruction tutorials can also be viewed under help and settings on the enclosed cell phone. Your monitor has already been registered assigning a specific monitor serial # to you.  Applying the monitor Remove cell phone from case and turn it on. The cell phone works as Dealer and needs to be within [employer] of you at all times. The cell phone will need to be charged on a daily basis. We recommend you plug the cell phone into the enclosed charger at your bedside table every night.  Monitor batteries: You will receive two monitor batteries labelled #1 and #2. These are your recorders. Plug battery #2 onto the second connection on the enclosed charger. Keep one battery on the charger at all times. This will keep the monitor battery deactivated. It will also keep it fully charged for when you need to switch your monitor batteries. A small light will be blinking on the battery emblem when it is charging. The light on the battery emblem will remain on when the battery is fully charged.  Open package of a Monitor strip. Insert battery #1 into black hood on strip and gently squeeze monitor battery onto connection as indicated in instruction  booklet. Set aside while preparing skin.  Choose location for your strip, vertical or horizontal, as indicated in the instruction booklet. Shave to remove all hair from location. There cannot be any lotions, oils, powders, or colognes on skin where monitor is to be applied. Wipe skin clean with enclosed Saline wipe. Dry skin completely.  Peel paper labeled #1 off the back of the Monitor strip exposing the adhesive. Place the monitor on the chest in the vertical or horizontal position shown in the instruction booklet. One arrow on the monitor strip must be pointing upward. Carefully remove paper labeled #2, attaching remainder of strip to your skin. Try not to create any folds or wrinkles in the strip as you apply it.  Firmly press and release the circle in the center of the monitor battery. You will hear a small beep. This is turning the monitor battery on. The heart emblem on the monitor battery will light up every 5 seconds if the monitor battery in turned on and connected to the patient securely. Do not push and hold the circle down as this turns the monitor battery off. The cell phone will locate the monitor battery. A screen will appear on the cell phone checking the connection of your monitor strip. This may read poor connection initially but change to good connection within the next minute. Once your monitor accepts the connection you will hear a series of 3 beeps followed by a climbing crescendo of beeps. A screen will appear on the cell phone showing the two monitor strip placement options. Touch the picture that demonstrates where you applied the monitor strip.  Your monitor strip and battery are waterproof. You are able to shower, bathe, or swim with the monitor on. They just ask you do not submerge deeper than 3 feet underwater. We recommend removing the monitor if you are swimming in a lake, river,  or ocean.  Your monitor battery will need to be switched to a fully charged  monitor battery approximately once a week. The cell phone will alert you of an action which needs to be made.  On the cell phone, tap for details to reveal connection status, monitor battery status, and cell phone battery status. The green dots indicates your monitor is in good status. A red dot indicates there is something that needs your attention.  To record a symptom, click the circle on the monitor battery. In 30-60 seconds a list of symptoms will appear on the cell phone. Select your symptom and tap save. Your monitor will record a sustained or significant arrhythmia regardless of you clicking the button. Some patients do not feel the heart rhythm irregularities. [employer] will notify us of any serious or critical events.  Refer to instruction booklet for instructions on switching batteries, changing strips, the Do not disturb or Pause features, or any additional questions.  Call [employer] at [phone], to confirm your monitor is transmitting and record your baseline. They will answer any questions you may have regarding the monitor instructions at that time.  Returning the monitor to [place] all equipment back into blue box. Peel off strip of paper to expose adhesive and close box securely. There is a prepaid UPS shipping label on this box. Drop in a UPS drop box, or at a UPS facility like [employer]. You may also contact [employer] to arrange UPS to pick up monitor package at your home.

## 2019-11-19 NOTE — Progress Notes (Signed)
Patient ID: Raymond Morris, male   DOB: 1931-07-29, 86 y.o.   MRN: [id number] Patient enrolled for [employer] to ship a 30 day cardiac event monitor to his home.  Request Bridge and pediatric electrodes in addition to normal strips.

## 2019-12-18 ENCOUNTER — Other Ambulatory Visit: Payer: Self-pay

## 2019-12-18 ENCOUNTER — Encounter: Payer: Self-pay | Admitting: Internal Medicine

## 2019-12-18 ENCOUNTER — Telehealth: Payer: Self-pay | Admitting: Internal Medicine

## 2019-12-18 ENCOUNTER — Ambulatory Visit (HOSPITAL_COMMUNITY): Payer: Medicare HMO | Attending: Cardiology

## 2019-12-18 DIAGNOSIS — R0600 Dyspnea, unspecified: Secondary | ICD-10-CM | POA: Diagnosis not present

## 2019-12-18 DIAGNOSIS — I10 Essential (primary) hypertension: Secondary | ICD-10-CM | POA: Diagnosis not present

## 2019-12-18 DIAGNOSIS — Z8673 Personal history of transient ischemic attack (TIA), and cerebral infarction without residual deficits: Secondary | ICD-10-CM | POA: Insufficient documentation

## 2019-12-18 DIAGNOSIS — R0609 Other forms of dyspnea: Secondary | ICD-10-CM

## 2019-12-18 LAB — ECHOCARDIOGRAM COMPLETE
Area-P 1/2: 3.65 cm2
S' Lateral: 3 cm

## 2019-12-18 MED ORDER — PERFLUTREN LIPID MICROSPHERE
1.0000 mL | INTRAVENOUS | Status: AC | PRN
  Administered 2019-12-18: 3 mL via INTRAVENOUS

## 2019-12-18 NOTE — Telephone Encounter (Signed)
No need to r/s 01/01/2020 visit if patient already has scheduled visit with Dr. Margaretann Loveless on 01/16/2019  Attempted to call patient - he is at scheduled echo appt at this time

## 2019-12-18 NOTE — Telephone Encounter (Signed)
Left patient a message to inform him that due to a change in [facility] schedule, his appointment on 01/01/20 was cancelled. The appointment was for an echo follow up. However, patient also has an appointment scheduled for 01/16/20 with Dr. Margaretann Loveless. Does the patient need to reschedule the 01/01/20 appointment? Please advise.

## 2019-12-19 ENCOUNTER — Telehealth: Payer: Self-pay

## 2019-12-19 NOTE — Telephone Encounter (Signed)
Spoke with patient regarding recent echo results as follows:  Elouise Munroe, MD  12/18/2019 4:34 PM EST      Grossly normal echo. No clear etiology for dyspnea. Possible afib on echo, will review when monitor results are available. Have pt keep appt on 1/14 - need ECG with that visit.     Patient states he would not like to wear cardiac event monitor due to not being technology savvy. Patient states he is unsure how to place monitor and set it up. Advised patient that if he brought in heart monitor to the office either I or another nurse in triage will help assist patient in placing heart monitor and setting it up. Patient stated that he would like to think about it over the weekend and will call back Monday to discuss what he decides. Gave patient number to office to return call.   Patient verbalized understanding.

## 2019-12-22 ENCOUNTER — Ambulatory Visit (INDEPENDENT_AMBULATORY_CARE_PROVIDER_SITE_OTHER): Payer: Medicare HMO

## 2019-12-22 DIAGNOSIS — Z8673 Personal history of transient ischemic attack (TIA), and cerebral infarction without residual deficits: Secondary | ICD-10-CM | POA: Diagnosis not present

## 2019-12-22 DIAGNOSIS — I4891 Unspecified atrial fibrillation: Secondary | ICD-10-CM

## 2019-12-22 DIAGNOSIS — I639 Cerebral infarction, unspecified: Secondary | ICD-10-CM | POA: Diagnosis not present

## 2019-12-22 NOTE — Telephone Encounter (Signed)
Patient came to office today for help with heart monitor application. Heart monitor placed and instructions gone over with patient and patients daughter. Follow up appointment pushed out to 01/27/20 with Dr. Margaretann Loveless to follow up after the monitor is completed. Advised patient to call back in to the office with any issues or concerns. Patient and daughter verbalized understanding.

## 2020-01-01 ENCOUNTER — Ambulatory Visit: Payer: Medicare HMO | Admitting: Adult Health

## 2020-01-16 ENCOUNTER — Ambulatory Visit: Payer: Medicare HMO | Admitting: Internal Medicine

## 2020-01-19 ENCOUNTER — Other Ambulatory Visit: Payer: Self-pay | Admitting: Internal Medicine

## 2020-01-19 DIAGNOSIS — Z8673 Personal history of transient ischemic attack (TIA), and cerebral infarction without residual deficits: Secondary | ICD-10-CM

## 2020-01-19 DIAGNOSIS — I639 Cerebral infarction, unspecified: Secondary | ICD-10-CM

## 2020-01-19 DIAGNOSIS — I4891 Unspecified atrial fibrillation: Secondary | ICD-10-CM

## 2020-01-23 ENCOUNTER — Telehealth: Payer: Self-pay | Admitting: Internal Medicine

## 2020-01-23 NOTE — Telephone Encounter (Signed)
01/23/20 @ 9:22 am spoke with patient regarding time change for appt kf  Rescheduled: 01/23/2020 9:22 AM

## 2020-01-27 ENCOUNTER — Encounter: Payer: Self-pay | Admitting: Internal Medicine

## 2020-01-27 ENCOUNTER — Ambulatory Visit: Payer: Medicare HMO | Admitting: Internal Medicine

## 2020-01-27 ENCOUNTER — Other Ambulatory Visit: Payer: Self-pay

## 2020-01-27 VITALS — BP 138/74 | HR 68 | Ht 68.0 in | Wt 249.2 lb

## 2020-01-27 DIAGNOSIS — E785 Hyperlipidemia, unspecified: Secondary | ICD-10-CM | POA: Diagnosis not present

## 2020-01-27 DIAGNOSIS — R0600 Dyspnea, unspecified: Secondary | ICD-10-CM | POA: Diagnosis not present

## 2020-01-27 DIAGNOSIS — Z8673 Personal history of transient ischemic attack (TIA), and cerebral infarction without residual deficits: Secondary | ICD-10-CM

## 2020-01-27 DIAGNOSIS — I10 Essential (primary) hypertension: Secondary | ICD-10-CM | POA: Diagnosis not present

## 2020-01-27 DIAGNOSIS — R0609 Other forms of dyspnea: Secondary | ICD-10-CM

## 2020-01-27 NOTE — Progress Notes (Signed)
Cardiology Office Note:    Date:  01/27/2020   ID:  Raymond Morris, DOB Feb 26, 1931, MRN [id number]  PCP:  Deland Pretty, MD  Cardiologist:  Elouise Munroe, MD  Electrophysiologist:  None   Referring MD: Deland Pretty, MD   Chief Complaint/Reason for Referral: SOB with exertion  History of Present Illness:    Raymond Morris is a 87 y.o. male with a history of HTN, HLD, chronic LE edema, CVA noted on MRA in 2002, AAA last imaged in 2019 and 3.7 cm felt to be stable at that time, monoglonal gammopathy noted in 2007, reports note that Hem/Onc Dr. Marin Olp felt it was benign. Presents for SOB on exertion. Daughter Sharyn Lull is present for visit and provides additional history.   Previously saw my college Dr. Tollie Eth. We do not have a copy of Dr. Thurman Coyer thorough records but will try to obtain this.  He has been evaluated for dyspnea in the past and has had stress nuclear study. This was abnormal and showed inferior ischemia. Report notes "the patient has reduced exercise tolerance and an abnormal EKG response to [low level] exercise. The perfusion defects are subtle but with clinical history suggest further evaluation for CAD."  Patient tells me the decided to pursue medical management and did not perform a cath at that time.   He notes getting short of breath at times, this is longstanding. Primarily with exertion No significant chest pain. Occasional palpitations. No syncope. Some LE swelling.  After our appointment, I received comprehensive records. Dr. 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 Given chronic symptoms, this was felt to be reasonable.   01/27/20: Patient continues to have SOB but feels hesitant to proceed with stress test. Will think about it. The patient denies chest pain, chest pressure, dyspnea at rest or with exertion,  palpitations, PND, orthopnea, or leg swelling. Denies cough, fever, chills. Denies nausea, vomiting. Denies syncope or presyncope. Denies dizziness or lightheadedness.    Past Medical History:  Diagnosis Date   AAA (abdominal aortic aneurysm) ([place])    AAA (abdominal aortic aneurysm) ([place])    Anxiety    Arthritis    "thumbs; hands" (10/08/2013)   Asthma    Carotid artery occlusion    Cellulitis of right foot 10/2013   Chronic bronchitis ([place])    "just about q yr"   Chronic lower back pain    COPD (chronic obstructive pulmonary disease) (H[facility])    CVA (cerebral infarction)    per MRA done 2002   Depression    Diverticulosis    Edema    chronic, lower extremities   GERD (gastroesophageal reflux disease)    Gout    11/2013 surgeries   Hiatal hernia    Hypercholesterolemia    Hyperlipidemia    Hypertension    Monoclonal gammopathy    DR Marin Olp, 2007,felt to be benign   Phlebitis    right leg   Pneumonia "9 times"   (10/08/2013)   Stroke [facility]) 2002   "didn't know I'd had one; just told me about it in 2015"   Upper respiratory infection    "I've had lots"   Urinary frequency    elevated PSA , with prostate nodule, Dr [id number]    Past Surgical History:  Procedure Laterality Date   APPENDECTOMY     COLONOSCOPY N/A 08/13/2015   Procedure: COLONOSCOPY;  Surgeon: Jeani HawkingPatrick Hung, MD;  [facility];  Service: Endoscopy;  Laterality: N/A;   EXCISIONAL HEMORRHOIDECTOMY     HERNIA REPAIR     UMBILICAL HERNIA REPAIR     VASECTOMY      Current Medications: Current Meds  Medication Sig   aspirin EC 81 MG tablet Take 81 mg by mouth daily.   carvedilol (COREG) 12.5 MG tablet Take 12.5 mg by mouth 2 (two) times daily with a meal.   cetirizine (ZYRTEC) 10 MG chewable tablet Chew 10 mg by mouth daily.   escitalopram (LEXAPRO) 20 MG tablet Take 20 mg by mouth daily. 1/2 -1 tablet once daily   esomeprazole (NEXIUM) 20 MG capsule Take 20 mg by mouth daily before breakfast.    Fluticasone-Salmeterol (ADVAIR) 250-50 MCG/DOSE AEPB Inhale 1 puff into the lungs 2 (two) times daily.   omega-3 acid ethyl esters (LOVAZA) 1 G capsule Take 3 g by mouth daily. Take 2 capsules daily   rosuvastatin (CRESTOR) 20 MG tablet Take 10 mg by mouth daily.   Tamsulosin HCl (FLOMAX) 0.4 MG CAPS Take 0.4 mg by mouth daily.   valsartan (DIOVAN) 320 MG tablet Take 320 mg by mouth daily.     Allergies:   Ceclor [cefaclor], Bactrim [sulfamethoxazole-trimethoprim], and Sulfa antibiotics   Social History   Tobacco Use   Smoking status: Former    Packs/day: 0.50    Years: 28.00    Pack years: 14.00    Types: Cigarettes    Quit date: 09/07/1976    Years since quitting: 43.8   Smokeless tobacco: Never  Substance Use Topics   Alcohol use: No    Alcohol/week: 0.0 standard drinks   Drug use: No     Family History: The patient's family history includes Cancer in his father and mother; Cerebral palsy in his son; Liver disease in his sister; Pneumonia in his daughter; Stroke in his sister.  ROS:   Please see the history of present illness.    All other systems reviewed and are negative.  EKGs/Labs/Other Studies Reviewed:    The following studies were reviewed today:  EKG:  n/a  Recent Labs: 05/28/2020: BUN 16; Creatinine, Ser 0.94; Hemoglobin 13.3; Platelets 192; Potassium 4.1; Sodium 135  Recent Lipid Panel No results found for: CHOL, TRIG, HDL, CHOLHDL, VLDL, LDLCALC, LDLDIRECT  Physical Exam:    VS:  BP 138/74 (BP Location: Left Arm, Patient Position: Sitting)    Pulse 68    Ht 5\' 8"  (1.727 m)    Wt 249 lb 3.2 oz (113 kg)    SpO2 93%    BMI 37.89 kg/m     Wt Readings from Last 5 Encounters:  05/28/20 249 lb 1.9 oz (113 kg)  01/27/20 249 lb 3.2 oz (113 kg)  11/19/19 244 lb 3.2 oz (110.8 kg)  07/24/17 235 lb 12.8 oz (107 kg)  09/26/16 243 lb (110.2 kg)    GEN: No acute distress.   Neck: No JVD Cardiac: RRR, no murmurs, rubs, or gallops.  Respiratory: Clear to  auscultation bilaterally. GI: Soft, nontender, non-distended  MS: No edema; No deformity. Neuro:  Nonfocal  Psych: Normal affect   ASSESSMENT:    1. Dyspnea on exertion   2. Hypertension, unspecified type   3. History of stroke   4. Essential hypertension, benign   5. Hyperlipidemia, unspecified hyperlipidemia type     PLAN:    Dyspnea on exertion -  - grossly normal echo. Strain not performed. - patient called office after our  visit and defers stress testing at this time.   Hypertension, unspecified type - Plan: ECHOCARDIOGRAM COMPLETE -BP within goal. Would continued carvedilol, and valsartan.   History of stroke -  - no afib on monitor.  Total time of encounter: 30 minutes total time of encounter, including 20 minutes spent in face-to-face patient care on the date of this encounter. This time includes coordination of care and counseling regarding above mentioned problem list. Remainder of non-face-to-face time involved reviewing chart documents/testing relevant to the patient encounter and documentation in the medical record. I have independently reviewed documentation from referring provider.   Cherlynn Kaiser, MD [place]   [facility]    Medication Adjustments/Labs and Tests Ordered: Current medicines are reviewed at length with the patient today.  Concerns regarding medicines are outlined above.   No orders of the defined types were placed in this encounter.   No orders of the defined types were placed in this encounter.   Patient Instructions  Medication Instructions:  No Changes In Medications at this time.  *If you need a refill on your cardiac medications before your next appointment, please call your pharmacy*  Follow-Up: At [facility], you and your health needs are our priority.  As part of our continuing mission to provide you with exceptional heart care, we have created designated Provider Care Teams.  These Care Teams include your primary  Cardiologist (physician) and Advanced Practice Providers (APPs -  Physician Assistants and Nurse Practitioners) who all work together to provide you with the care you need, when you need it.  We recommend signing up for the patient portal called "MyChart".  Sign up information is provided on this After Visit Summary.  MyChart is used to connect with patients for Virtual Visits (Telemedicine).  Patients are able to view lab/test results, encounter notes, upcoming appointments, etc.  Non-urgent messages can be sent to your provider as well.   To learn more about what you can do with MyChart, go to [email or web address].    Your next appointment:   3 month(s)  The format for your next appointment:   In Person  Provider:   Cherlynn Kaiser, MD  Other Instructions [place] TEST.

## 2020-01-27 NOTE — Patient Instructions (Signed)
Medication Instructions:  No Changes In Medications at this time.  *If you need a refill on your cardiac medications before your next appointment, please call your pharmacy*  Follow-Up: At [facility], you and your health needs are our priority.  As part of our continuing mission to provide you with exceptional heart care, we have created designated Provider Care Teams.  These Care Teams include your primary Cardiologist (physician) and Advanced Practice Providers (APPs -  Physician Assistants and Nurse Practitioners) who all work together to provide you with the care you need, when you need it.  We recommend signing up for the patient portal called "MyChart".  Sign up information is provided on this After Visit Summary.  MyChart is used to connect with patients for Virtual Visits (Telemedicine).  Patients are able to view lab/test results, encounter notes, upcoming appointments, etc.  Non-urgent messages can be sent to your provider as well.   To learn more about what you can do with MyChart, go to [email or web address].    Your next appointment:   3 month(s)  The format for your next appointment:   In Person  Provider:   Cherlynn Kaiser, MD  Other Instructions [place] TEST.

## 2020-02-03 ENCOUNTER — Telehealth: Payer: Self-pay | Admitting: Internal Medicine

## 2020-02-03 NOTE — Telephone Encounter (Signed)
Raymond Morris is calling stating he has to decided to hold off on getting the stress test and will callback when he is ready to schedule. He requested a message be sent notifying Dr. Margaretann Loveless.

## 2020-02-03 NOTE — Telephone Encounter (Signed)
Will forward to Jamul B and Dr Margaretann Loveless

## 2020-03-22 DIAGNOSIS — I10 Essential (primary) hypertension: Secondary | ICD-10-CM | POA: Diagnosis not present

## 2020-03-22 DIAGNOSIS — R7303 Prediabetes: Secondary | ICD-10-CM | POA: Diagnosis not present

## 2020-03-22 DIAGNOSIS — E7800 Pure hypercholesterolemia, unspecified: Secondary | ICD-10-CM | POA: Diagnosis not present

## 2020-03-25 DIAGNOSIS — F339 Major depressive disorder, recurrent, unspecified: Secondary | ICD-10-CM | POA: Diagnosis not present

## 2020-03-25 DIAGNOSIS — D472 Monoclonal gammopathy: Secondary | ICD-10-CM | POA: Diagnosis not present

## 2020-03-25 DIAGNOSIS — Z0001 Encounter for general adult medical examination with abnormal findings: Secondary | ICD-10-CM | POA: Diagnosis not present

## 2020-03-25 DIAGNOSIS — I10 Essential (primary) hypertension: Secondary | ICD-10-CM | POA: Diagnosis not present

## 2020-03-25 DIAGNOSIS — Z8673 Personal history of transient ischemic attack (TIA), and cerebral infarction without residual deficits: Secondary | ICD-10-CM | POA: Diagnosis not present

## 2020-03-25 DIAGNOSIS — M1A2790 Drug-induced chronic gout, unspecified ankle and foot, without tophus (tophi): Secondary | ICD-10-CM | POA: Diagnosis not present

## 2020-04-27 ENCOUNTER — Ambulatory Visit: Payer: Medicare HMO | Admitting: Internal Medicine

## 2020-05-28 ENCOUNTER — Emergency Department (HOSPITAL_COMMUNITY): Payer: Medicare HMO

## 2020-05-28 ENCOUNTER — Other Ambulatory Visit: Payer: Self-pay

## 2020-05-28 ENCOUNTER — Emergency Department (HOSPITAL_COMMUNITY)
Admission: EM | Admit: 2020-05-28 | Discharge: 2020-05-28 | Disposition: A | Discharge status: Home / Self Care | Payer: Medicare HMO | Attending: Emergency Medicine | Admitting: Emergency Medicine

## 2020-05-28 ENCOUNTER — Encounter (HOSPITAL_COMMUNITY): Payer: Self-pay | Admitting: *Deleted

## 2020-05-28 DIAGNOSIS — R079 Chest pain, unspecified: Secondary | ICD-10-CM | POA: Diagnosis not present

## 2020-05-28 DIAGNOSIS — M79602 Pain in left arm: Secondary | ICD-10-CM | POA: Insufficient documentation

## 2020-05-28 DIAGNOSIS — M25512 Pain in left shoulder: Secondary | ICD-10-CM | POA: Diagnosis not present

## 2020-05-28 DIAGNOSIS — M436 Torticollis: Secondary | ICD-10-CM | POA: Insufficient documentation

## 2020-05-28 DIAGNOSIS — Z5321 Procedure and treatment not carried out due to patient leaving prior to being seen by health care provider: Secondary | ICD-10-CM | POA: Insufficient documentation

## 2020-05-28 DIAGNOSIS — M542 Cervicalgia: Secondary | ICD-10-CM | POA: Diagnosis not present

## 2020-05-28 DIAGNOSIS — R0602 Shortness of breath: Secondary | ICD-10-CM | POA: Diagnosis not present

## 2020-05-28 DIAGNOSIS — R0789 Other chest pain: Secondary | ICD-10-CM | POA: Diagnosis not present

## 2020-05-28 LAB — CBC WITH DIFFERENTIAL/PLATELET
Abs Immature Granulocytes: 0.04 10*3/uL (ref 0.00–0.07)
Basophils Absolute: 0 10*3/uL (ref 0.0–0.1)
Basophils Relative: 1 %
Eosinophils Absolute: 0.2 10*3/uL (ref 0.0–0.5)
Eosinophils Relative: 3 %
HCT: 40.2 % (ref 39.0–52.0)
Hemoglobin: 13.3 g/dL (ref 13.0–17.0)
Immature Granulocytes: 1 %
Lymphocytes Relative: 13 %
Lymphs Abs: 1.1 10*3/uL (ref 0.7–4.0)
MCH: 32.1 pg (ref 26.0–34.0)
MCHC: 33.1 g/dL (ref 30.0–36.0)
MCV: 97.1 fL (ref 80.0–100.0)
Monocytes Absolute: 0.9 10*3/uL (ref 0.1–1.0)
Monocytes Relative: 11 %
Neutro Abs: 6 10*3/uL (ref 1.7–7.7)
Neutrophils Relative %: 71 %
Platelets: 192 10*3/uL (ref 150–400)
RBC: 4.14 MIL/uL — ABNORMAL LOW (ref 4.22–5.81)
RDW: 13 % (ref 11.5–15.5)
WBC: 8.3 10*3/uL (ref 4.0–10.5)
nRBC: 0 % (ref 0.0–0.2)

## 2020-05-28 LAB — BASIC METABOLIC PANEL
Anion gap: 9 (ref 5–15)
BUN: 16 mg/dL (ref 8–23)
CO2: 27 mmol/L (ref 22–32)
Calcium: 9 mg/dL (ref 8.9–10.3)
Chloride: 99 mmol/L (ref 98–111)
Creatinine, Ser: 0.94 mg/dL (ref 0.61–1.24)
GFR, Estimated: >60 mL/min (ref >60)
Glucose, Bld: 113 mg/dL — ABNORMAL HIGH (ref 70–99)
Potassium: 4.1 mmol/L (ref 3.5–5.1)
Sodium: 135 mmol/L (ref 135–145)

## 2020-05-28 LAB — TROPONIN I (HIGH SENSITIVITY)
Troponin I (High Sensitivity): 8 ng/L (ref <18)
Troponin I (High Sensitivity): 8 ng/L (ref <18)

## 2020-05-28 NOTE — ED Provider Notes (Signed)
Emergency Medicine Provider Triage Evaluation Note  Raymond Morris , a 87 y.o. male  was evaluated in triage.  Pt complains of neck pain which began 3 days ago.  Starts at posterior left trapezius and radiates down his left arm.  No associated paresthesias or weakness.  Does occasionally have chest pain and shortness of breath however patient states he has had this for "a long time."  No recent trauma.  No headache.  Denies any recent trauma or injury.  Review of Systems  Positive: Neck pain  negative: Headache, paresthesias, weakness  Physical Exam  There were no vitals taken for this visit. Gen:   Awake, no distress   Resp:  Normal effort  MSK:   Moves extremities without difficulty.  Diffuse tenderness to left trapezius Other:    Medical Decision Making  Medically screening exam initiated at 4:30 PM.  Appropriate orders placed.  Raymond Morris was informed that the remainder of the evaluation will be completed by another provider, this initial triage assessment does not replace that evaluation, and the importance of remaining in the ED until their evaluation is complete.  Posterior neck pain   Gaspar Fowle A, PA-C 05/28/20 1634    Breck Coons, MD 05/28/20 [id number]

## 2020-05-28 NOTE — ED Triage Notes (Signed)
The pt c/o his neck popping 3 days ago and since then his neck lt shoulder and lt arm are painful  No known injury no previous history

## 2020-05-28 NOTE — ED Notes (Signed)
Hard of hearing

## 2020-05-28 NOTE — ED Notes (Signed)
Pt said to tech they are going home. Moving OTF.

## 2020-05-30 ENCOUNTER — Other Ambulatory Visit: Payer: Self-pay

## 2020-05-30 ENCOUNTER — Ambulatory Visit
Admission: RE | Admit: 2020-05-30 | Discharge: 2020-05-30 | Disposition: A | Discharge status: Home / Self Care | Payer: Medicare HMO | Source: Ambulatory Visit | Attending: Emergency Medicine | Admitting: Emergency Medicine

## 2020-05-30 VITALS — BP 142/70 | HR 69 | Temp 98.7°F | Resp 18

## 2020-05-30 DIAGNOSIS — M5030 Other cervical disc degeneration, unspecified cervical region: Secondary | ICD-10-CM

## 2020-05-30 DIAGNOSIS — J449 Chronic obstructive pulmonary disease, unspecified: Secondary | ICD-10-CM | POA: Diagnosis not present

## 2020-05-30 DIAGNOSIS — S161XXA Strain of muscle, fascia and tendon at neck level, initial encounter: Secondary | ICD-10-CM | POA: Diagnosis not present

## 2020-05-30 MED ORDER — TIZANIDINE HCL 2 MG PO TABS: 2.0000 mg | ORAL_TABLET | Freq: Four times a day (QID) | ORAL | 0 refills | Status: DC | PRN

## 2020-05-30 MED ORDER — PREDNISONE 10 MG PO TABS: ORAL_TABLET | ORAL | 0 refills | Status: DC

## 2020-05-30 NOTE — ED Provider Notes (Signed)
[facility]    CSN: [id number] Arrival date & time: 05/30/20  0946      History   Chief Complaint Chief Complaint  Patient presents with  . Appointment    1000    HPI Raymond Morris is a 87 y.o. male presenting today for evaluation of neck pain.  Reports symptoms over the past 4 days.  Recently seen in emergency room with CT scan of cervical spine showing degenerative changes and cervical spondylosis, had troponin work-up which was negative.  Reports pain radiating to left side.  Pain mainly with movement of neck.  Denies pain with movement of shoulder.  Denies numbness or tingling into left arm.  Denies history of diabetes.  Takes daily 81 mg aspirin.  Reports feeling a cracking sensation in his neck with certain movements.  HPI  Past Medical History:  Diagnosis Date  . AAA (abdominal aortic aneurysm) ([place])   . AAA (abdominal aortic aneurysm) ([place])   . Anxiety   . Arthritis    "thumbs; hands" (10/08/2013)  . Asthma   . Carotid artery occlusion   . Cellulitis of right foot 10/2013  . Chronic bronchitis ([place])    "just about q yr"  . Chronic lower back pain   . COPD (chronic obstructive pulmonary disease) ([place])   . CVA (cerebral infarction)    per MRA done 2002  . Depression   . Diverticulosis   . Edema    chronic, lower extremities  . GERD (gastroesophageal reflux disease)   . Gout    11/2013 surgeries  . Hiatal hernia   . Hypercholesterolemia   . Hyperlipidemia   . Hypertension   . Monoclonal gammopathy    DR Marin Olp, 2007,felt to be benign  . Phlebitis    right leg  . Pneumonia "9 times"   (10/08/2013)  . Stroke [facility]) 2002   "didn't know I'd had one; just told me about it in 2015"  . Upper respiratory infection    "I've had lots"  . Urinary frequency    elevated PSA , with prostate nodule, Dr [id number]    Patient Active Problem List   Diagnosis Date Noted  . Cellulitis 10/08/2013  . Cellulitis of foot, right 10/08/2013  . Hypokalemia  10/08/2013  . Essential hypertension, benign 10/08/2013  . Hyperlipidemia 10/08/2013  . GERD (gastroesophageal reflux disease) 10/08/2013  . Abdominal aneurysm without mention of rupture 11/10/2010    Past Surgical History:  Procedure Laterality Date  . APPENDECTOMY    . COLONOSCOPY N/A 08/13/2015   Procedure: COLONOSCOPY;  Surgeon: Carol Ada, MD;  Location: [facility];  Service: Endoscopy;  Laterality: N/A;  . EXCISIONAL HEMORRHOIDECTOMY    . HERNIA REPAIR    . UMBILICAL HERNIA REPAIR    . VASECTOMY         Home Medications    Prior to Admission medications   Medication Sig Start Date End Date Taking? Authorizing Provider  predniSONE (DELTASONE) 10 MG tablet Begin with 6 tabs on day 1, 5 tab on day 2, 4 tab on day 3, 3 tab on day 4, 2 tab on day 5, 1 tab on day 6-take with food 05/30/20  Yes Micheal Murad C, PA-C  tiZANidine (ZANAFLEX) 2 MG tablet Take 1-2 tablets (2-4 mg total) by mouth every 6 (six) hours as needed for muscle spasms. 05/30/20  Yes Draper Gallon C, PA-C  aspirin EC 81 MG tablet Take 81 mg by mouth daily.    [provider]  carvedilol (COREG)  12.5 MG tablet Take 12.5 mg by mouth 2 (two) times daily with a meal.    [provider]  cetirizine (ZYRTEC) 10 MG chewable tablet Chew 10 mg by mouth daily.    [provider]  escitalopram (LEXAPRO) 20 MG tablet Take 20 mg by mouth daily. 1/2 -1 tablet once daily    [provider]  esomeprazole (NEXIUM) 20 MG capsule Take 20 mg by mouth daily before breakfast.    [provider]  Fluticasone-Salmeterol (ADVAIR) 250-50 MCG/DOSE AEPB Inhale 1 puff into the lungs 2 (two) times daily.    [provider]  omega-3 acid ethyl esters (LOVAZA) 1 G capsule Take 3 g by mouth daily. Take 2 capsules daily    [provider]  rosuvastatin (CRESTOR) 20 MG tablet Take 10 mg by mouth daily.    [provider]  Tamsulosin HCl (FLOMAX) 0.4 MG CAPS Take 0.4 mg by  mouth daily.    [provider]  valsartan (DIOVAN) 320 MG tablet Take 320 mg by mouth daily.    [provider]    Family History Family History  Problem Relation Age of Onset  . Cancer Mother   . Cancer Father   . Stroke Sister   . Liver disease Sister   . Pneumonia Daughter        pnemoccoal, deceased  . Cerebral palsy Son     Social History Social History   Tobacco Use  . Smoking status: Former Smoker    Packs/day: 0.50    Years: 28.00    Pack years: 14.00    Types: Cigarettes    Quit date: 09/07/1976    Years since quitting: 43.7  . Smokeless tobacco: Never Used  Substance Use Topics  . Alcohol use: No    Alcohol/week: 0.0 standard drinks  . Drug use: No     Allergies   Ceclor [cefaclor], Bactrim [sulfamethoxazole-trimethoprim], and Sulfa antibiotics   Review of Systems Review of Systems  Constitutional: Negative for fatigue and fever.  Eyes: Negative for redness, itching and visual disturbance.  Respiratory: Negative for shortness of breath.   Cardiovascular: Negative for chest pain and leg swelling.  Gastrointestinal: Negative for nausea and vomiting.  Musculoskeletal: Positive for arthralgias, myalgias and neck pain.  Skin: Negative for color change, rash and wound.  Neurological: Negative for dizziness, syncope, weakness, light-headedness and headaches.     Physical Exam Triage Vital Signs ED Triage Vitals  Enc Vitals Group     BP 05/30/20 1019 (!) 142/70     Pulse Rate 05/30/20 1019 69     Resp 05/30/20 1019 18     Temp 05/30/20 1019 98.7 F (37.1 C)     Temp Source 05/30/20 1019 Oral     SpO2 05/30/20 1019 95 %     Weight --      Height --      Head Circumference --      Peak Flow --      Pain Score 05/30/20 1020 8     Pain Loc --      Pain Edu? --      Excl. in [place]? --    No data found.  Updated Vital Signs BP (!) 142/70 (BP Location: Left Arm)   Pulse 69   Temp 98.7 F (37.1 C) (Oral)   Resp 18   SpO2 95%    Visual Acuity Right Eye Distance:   Left Eye Distance:   Bilateral Distance:    Right Eye  Near:   Left Eye Near:    Bilateral Near:     Physical Exam Vitals and nursing note reviewed.  Constitutional:      Appearance: He is well-developed.     Comments: No acute distress  HENT:     Head: Normocephalic and atraumatic.     Nose: Nose normal.  Eyes:     Conjunctiva/sclera: Conjunctivae normal.  Cardiovascular:     Rate and Rhythm: Normal rate.  Pulmonary:     Effort: Pulmonary effort is normal. No respiratory distress.  Abdominal:     General: There is no distension.  Musculoskeletal:        General: Normal range of motion.     Cervical back: Neck supple.     Comments: Spine: Nontender to palpation to superior cervical spine, tender to palpation to lower cervical spine and extending into left cervical/upper trapezius area on left, full active range of motion of neck  Left shoulder with full active range of motion, strength 5/5 ankle bilaterally, grip strength 5/5 and equal bilaterally, radial pulse 2+ bilaterally  Skin:    General: Skin is warm and dry.  Neurological:     Mental Status: He is alert and oriented to person, place, and time.      UC Treatments / Results  Labs (all labs ordered are listed, but only abnormal results are displayed) Labs Reviewed - No data to display  EKG   Radiology DG Chest 2 View  Result Date: 05/28/2020 CLINICAL DATA:  Chest pain EXAM: CHEST - 2 VIEW COMPARISON:  Chest radiograph report 11/27/2017, images not retrievable at the time of exam. Chest radiograph images from 07/26/2007. FINDINGS: Unchanged cardiomediastinal silhouette. There are bibasilar streaky opacities, likely scarring and atelectasis. There is no new focal airspace consolidation. Thoracic spondylosis. IMPRESSION: Streaky bibasilar opacities, likely scarring and/or atelectasis. No focal airspace consolidation. Electronically Signed   By: Maurine Simmering   On: 05/28/2020  17:21   CT Cervical Spine Wo Contrast  Result Date: 05/28/2020 CLINICAL DATA:  87 year old male with history of left-sided neck pain for the past 3 days. EXAM: CT CERVICAL SPINE WITHOUT CONTRAST TECHNIQUE: Multidetector CT imaging of the cervical spine was performed without intravenous contrast. Multiplanar CT image reconstructions were also generated. COMPARISON:  No priors. FINDINGS: Alignment: Straightening of normal cervical lordosis, likely chronic. Skull base and vertebrae: No acute fracture. No primary bone lesion or focal pathologic process. Soft tissues and spinal canal: No prevertebral fluid or swelling. No visible canal hematoma. Disc levels: Multilevel degenerative disc disease, most pronounced at C5-C6 and C6-C7. Mild multilevel facet arthropathy. Upper chest: Negative. Other: None. IMPRESSION: 1. No acute abnormality of the cervical spine. 2. Multilevel degenerative disc disease and cervical spondylosis, as above. Electronically Signed   By: [name] M.D.   On: 05/28/2020 17:35    Procedures Procedures (including critical care time)  Medications Ordered in UC Medications - No data to display  Initial Impression / Assessment and Plan / UC Course  I have reviewed the triage vital signs and the nursing notes.  Pertinent labs & imaging results that were available during my care of the patient were reviewed by me and considered in my medical decision making (see chart for details).     Suspect cervical strain/flaring of underlying degenerative disc disease in cervical area, placing on prednisone course along with muscle relaxers, offered Toradol, opted to defer.  Gentle neck stretching, ice and heat.  Discussed strict return precautions. Patient verbalized understanding and is agreeable with  plan.  Final Clinical Impressions(s) / UC Diagnoses   Final diagnoses:  Strain of neck muscle, initial encounter  Degenerative disc disease, cervical     Discharge Instructions      Begin prednisone taper x6 days-begin with 6 tablets on day 1, decrease by 1 tablet each day until complete-6, 5, 4, 3, 2, 1-take with food and earlier in the day if possible  Use tizanidine at bedtime-will cause drowsiness  Gentle neck stretching-see attached  Alternate ice and heat  Follow-up if not improving or worsening    ED Prescriptions    Medication Sig Dispense Auth. Provider   predniSONE (DELTASONE) 10 MG tablet Begin with 6 tabs on day 1, 5 tab on day 2, 4 tab on day 3, 3 tab on day 4, 2 tab on day 5, 1 tab on day 6-take with food 21 tablet Mickie Badders C, PA-C   tiZANidine (ZANAFLEX) 2 MG tablet Take 1-2 tablets (2-4 mg total) by mouth every 6 (six) hours as needed for muscle spasms. 30 tablet Ahnesty Finfrock, [place] C, PA-C     I have reviewed the PDMP during this encounter.   Janith Lima, PA-C 05/30/20 1153

## 2020-05-30 NOTE — ED Triage Notes (Signed)
Pt here for neck pain x 4 days; pt was seen in ED for same but LWBS after having some testing due to wait time; denies obvious injury; pt sts pain radiates to left left upper back

## 2020-05-30 NOTE — Discharge Instructions (Signed)
Begin prednisone taper x6 days-begin with 6 tablets on day 1, decrease by 1 tablet each day until complete-6, 5, 4, 3, 2, 1-take with food and earlier in the day if possible  Use tizanidine at bedtime-will cause drowsiness  Gentle neck stretching-see attached  Alternate ice and heat  Follow-up if not improving or worsening

## 2020-06-03 DIAGNOSIS — R7303 Prediabetes: Secondary | ICD-10-CM | POA: Diagnosis not present

## 2020-06-03 DIAGNOSIS — E7800 Pure hypercholesterolemia, unspecified: Secondary | ICD-10-CM | POA: Diagnosis not present

## 2020-06-08 DIAGNOSIS — I10 Essential (primary) hypertension: Secondary | ICD-10-CM | POA: Diagnosis not present

## 2020-06-08 DIAGNOSIS — M5030 Other cervical disc degeneration, unspecified cervical region: Secondary | ICD-10-CM | POA: Diagnosis not present

## 2020-06-08 DIAGNOSIS — Z8673 Personal history of transient ischemic attack (TIA), and cerebral infarction without residual deficits: Secondary | ICD-10-CM | POA: Diagnosis not present

## 2020-06-08 DIAGNOSIS — R7303 Prediabetes: Secondary | ICD-10-CM | POA: Diagnosis not present

## 2020-06-08 DIAGNOSIS — E7800 Pure hypercholesterolemia, unspecified: Secondary | ICD-10-CM | POA: Diagnosis not present

## 2020-07-16 ENCOUNTER — Ambulatory Visit (INDEPENDENT_AMBULATORY_CARE_PROVIDER_SITE_OTHER): Payer: Medicare HMO

## 2020-07-16 ENCOUNTER — Other Ambulatory Visit: Payer: Self-pay

## 2020-07-16 ENCOUNTER — Ambulatory Visit: Payer: Medicare HMO | Admitting: Orthopaedic Surgery

## 2020-07-16 VITALS — BP 138/70 | HR 65 | Ht 68.0 in | Wt 249.1 lb

## 2020-07-16 DIAGNOSIS — M542 Cervicalgia: Secondary | ICD-10-CM

## 2020-07-16 NOTE — Progress Notes (Signed)
Office Visit Note   Patient: Raymond Morris           Date of Birth: 1931-01-05           MRN: [id number] Visit Date: 07/16/2020              Requested by: Deland Pretty, MD [address] [place],  [place] [address] PCP: Deland Pretty, MD   Assessment & Plan: Visit Diagnoses:  1. Cervicalgia     Plan: Patient with cervical spondylosis.  He has multilevel degenerative changes.  No evidence of significant radiculopathy, no myelopathy.  We discussed symptoms would be of concern.  He can return if he has increased symptoms.  Follow-Up Instructions: No follow-ups on file.   Orders:  Orders Placed This Encounter  Procedures   XR Cervical Spine 2 or 3 views   No orders of the defined types were placed in this encounter.     Procedures: No procedures performed   Clinical Data: No additional findings.   Subjective: Chief Complaint  Patient presents with   Neck - Pain    HPI 87 year old male with neck pain that radiates down his left arm.  Patient had a stroke 15 years ago and states that bothered the left side.  CT cervical spine done 05/28/2020 showed no acute abnormality of the cervical spine but multilevel degenerative changes most pronounced at C5-6 and C6-7.  Patient's been followed by Dr. Deland Pretty.  He was seen in the emergency room for neck pain.  Review of Systems all the systems noncontributory to HPI.   Objective: Vital Signs: BP 138/70   Pulse 65   Ht 5\' 8"  (1.727 m)   Wt 249 lb 1.9 oz (113 kg)   BMI 37.88 kg/m   Physical Exam Constitutional:      Appearance: He is well-developed.  HENT:     Head: Normocephalic and atraumatic.     Right Ear: External ear normal.     Left Ear: External ear normal.  Eyes:     Pupils: Pupils are equal, round, and reactive to light.  Neck:     Thyroid: No thyromegaly.     Trachea: No tracheal deviation.  Cardiovascular:     Rate and Rhythm: Normal rate.  Pulmonary:     Effort: Pulmonary effort  is normal.     Breath sounds: No wheezing.  Abdominal:     General: Bowel sounds are normal.     Palpations: Abdomen is soft.  Musculoskeletal:     Cervical back: Neck supple.  Skin:    General: Skin is warm and dry.     Capillary Refill: Capillary refill takes less than 2 seconds.  Neurological:     Mental Status: He is alert and oriented to person, place, and time.  Psychiatric:        Behavior: Behavior normal.        Thought Content: Thought content normal.        Judgment: Judgment normal.    Ortho Exam patient has intact upper and lower extremity reflexes.  Negative impingement of the shoulders.  Some discomfort with rotation of cervical spine.  Negative myelopathic gait.  Specialty Comments:  No specialty comments available.  Imaging: No results found.   PMFS History: Patient Active Problem List   Diagnosis Date Noted   Cellulitis 10/08/2013   Cellulitis of foot, right 10/08/2013   Hypokalemia 10/08/2013   Essential hypertension, benign 10/08/2013   Hyperlipidemia 10/08/2013   GERD (gastroesophageal reflux  disease) 10/08/2013   Abdominal aneurysm without mention of rupture 11/10/2010   Past Medical History:  Diagnosis Date   AAA (abdominal aortic aneurysm) (HCC)    AAA (abdominal aortic aneurysm) ([place])    Anxiety    Arthritis    "thumbs; hands" (10/08/2013)   Asthma    Carotid artery occlusion    Cellulitis of right foot 10/2013   Chronic bronchitis ([place])    "just about q yr"   Chronic lower back pain    COPD (chronic obstructive pulmonary disease) (HCC)    CVA (cerebral infarction)    per MRA done 2002   Depression    Diverticulosis    Edema    chronic, lower extremities   GERD (gastroesophageal reflux disease)    Gout    11/2013 surgeries   Hiatal hernia    Hypercholesterolemia    Hyperlipidemia    Hypertension    Monoclonal gammopathy    DR Marin Olp, 2007,felt to be benign   Phlebitis    right leg   Pneumonia "9 times"   (10/08/2013)    Stroke [facility]) 2002   "didn't know I'd had one; just told me about it in 2015"   Upper respiratory infection    "I've had lots"   Urinary frequency    elevated PSA , with prostate nodule, Dr [id number]    Family History  Problem Relation Age of Onset   Cancer Mother    Cancer Father    Stroke Sister    Liver disease Sister    Pneumonia Daughter        pnemoccoal, deceased   Cerebral palsy Son     Past Surgical History:  Procedure Laterality Date   APPENDECTOMY     COLONOSCOPY N/A 08/13/2015   Procedure: COLONOSCOPY;  Surgeon: Carol Ada, MD;  Location: [facility];  Service: Endoscopy;  Laterality: N/A;   EXCISIONAL HEMORRHOIDECTOMY     HERNIA REPAIR     UMBILICAL HERNIA REPAIR     VASECTOMY     Social History   Occupational History   Not on file  Tobacco Use   Smoking status: Former    Packs/day: 0.50    Years: 28.00    Pack years: 14.00    Types: Cigarettes    Quit date: 09/07/1976    Years since quitting: 43.8   Smokeless tobacco: Never  Substance and Sexual Activity   Alcohol use: No    Alcohol/week: 0.0 standard drinks   Drug use: No   Sexual activity: Not on file

## 2020-07-22 DIAGNOSIS — I10 Essential (primary) hypertension: Secondary | ICD-10-CM | POA: Diagnosis not present

## 2020-07-27 DIAGNOSIS — H2513 Age-related nuclear cataract, bilateral: Secondary | ICD-10-CM | POA: Diagnosis not present

## 2020-07-27 DIAGNOSIS — H40033 Anatomical narrow angle, bilateral: Secondary | ICD-10-CM | POA: Diagnosis not present

## 2020-08-17 DIAGNOSIS — I10 Essential (primary) hypertension: Secondary | ICD-10-CM | POA: Diagnosis not present

## 2020-08-17 DIAGNOSIS — E7800 Pure hypercholesterolemia, unspecified: Secondary | ICD-10-CM | POA: Diagnosis not present

## 2020-08-17 DIAGNOSIS — R7303 Prediabetes: Secondary | ICD-10-CM | POA: Diagnosis not present

## 2020-08-17 DIAGNOSIS — Z8673 Personal history of transient ischemic attack (TIA), and cerebral infarction without residual deficits: Secondary | ICD-10-CM | POA: Diagnosis not present

## 2020-09-28 DIAGNOSIS — I10 Essential (primary) hypertension: Secondary | ICD-10-CM | POA: Diagnosis not present

## 2020-09-28 DIAGNOSIS — Z23 Encounter for immunization: Secondary | ICD-10-CM | POA: Diagnosis not present

## 2020-09-28 DIAGNOSIS — E7800 Pure hypercholesterolemia, unspecified: Secondary | ICD-10-CM | POA: Diagnosis not present

## 2020-09-28 DIAGNOSIS — Z8673 Personal history of transient ischemic attack (TIA), and cerebral infarction without residual deficits: Secondary | ICD-10-CM | POA: Diagnosis not present

## 2020-09-28 DIAGNOSIS — R7303 Prediabetes: Secondary | ICD-10-CM | POA: Diagnosis not present

## 2020-10-07 DIAGNOSIS — R3912 Poor urinary stream: Secondary | ICD-10-CM | POA: Diagnosis not present

## 2020-10-07 DIAGNOSIS — N403 Nodular prostate with lower urinary tract symptoms: Secondary | ICD-10-CM | POA: Diagnosis not present

## 2020-10-21 ENCOUNTER — Emergency Department
Admission: RE | Admit: 2020-10-21 | Discharge: 2020-10-21 | Disposition: A | Discharge status: Home / Self Care | Payer: Medicare HMO | Source: Ambulatory Visit | Attending: Family Medicine | Admitting: Family Medicine

## 2020-10-21 ENCOUNTER — Other Ambulatory Visit: Payer: Self-pay

## 2020-10-21 VITALS — BP 151/82 | HR 60 | Temp 98.5°F | Resp 20 | Ht 68.5 in | Wt 250.0 lb

## 2020-10-21 DIAGNOSIS — N410 Acute prostatitis: Secondary | ICD-10-CM

## 2020-10-21 DIAGNOSIS — J449 Chronic obstructive pulmonary disease, unspecified: Secondary | ICD-10-CM | POA: Diagnosis not present

## 2020-10-21 DIAGNOSIS — R300 Dysuria: Secondary | ICD-10-CM | POA: Diagnosis not present

## 2020-10-21 LAB — POCT URINALYSIS DIP (MANUAL ENTRY)
Bilirubin, UA: NEGATIVE
Blood, UA: NEGATIVE
Glucose, UA: NEGATIVE mg/dL
Ketones, POC UA: NEGATIVE mg/dL
Leukocytes, UA: NEGATIVE
Nitrite, UA: NEGATIVE
Protein Ur, POC: 30 mg/dL — AB
Spec Grav, UA: 1.02 (ref 1.010–1.025)
Urobilinogen, UA: 4 E.U./dL — AB
pH, UA: 6.5 (ref 5.0–8.0)

## 2020-10-21 MED ORDER — PHENAZOPYRIDINE HCL 200 MG PO TABS
200.0000 mg | ORAL_TABLET | Freq: Three times a day (TID) | ORAL | 0 refills | Status: DC
Start: 2020-10-21 — End: 2022-06-20

## 2020-10-21 MED ORDER — CIPROFLOXACIN HCL 500 MG PO TABS: 500.0000 mg | ORAL_TABLET | Freq: Two times a day (BID) | ORAL | 0 refills | Status: DC

## 2020-10-21 MED ORDER — FINASTERIDE 5 MG PO TABS: 5.0000 mg | ORAL_TABLET | Freq: Every day | ORAL | 0 refills | Status: AC

## 2020-10-21 MED ORDER — KETOROLAC TROMETHAMINE 30 MG/ML IJ SOLN
30.0000 mg | Freq: Once | INTRAMUSCULAR | Status: AC
  Administered 2020-10-21: 30 mg via INTRAMUSCULAR

## 2020-10-21 NOTE — Discharge Instructions (Signed)
I have prescribed an antibiotic called Cipro.  Take this pill 2 times a day. I have also prescribed Pyridium.  It is a urinary numbing medicine.  It will take away some of the burning and trouble urinating.  It causes your urine to be bright orange I have also prescribed Proscar (finasteride).  This is a medicine that helps shrink the prostate.  Finasteride may be the same medicine the urologist recommended, but I would like for you to try it again I am giving you a shot for pain.  This will help you rest and get some sleep tonight.  Tomorrow try Tylenol for pain You might need to go to the emergency room if you get worse, have fevers, or stop peeing Call and make an appointment to see your primary care doctor next week

## 2020-10-21 NOTE — ED Triage Notes (Signed)
Pt presents to Urgent Care with c/o dysuria and urinary frequency x several days. Reports being seen by MD recently for BPH and being prescribed new medication which has not helped.

## 2020-10-21 NOTE — ED Provider Notes (Signed)
[facility]    CSN: [id number] Arrival date & time: 10/21/20  1642      History   Chief Complaint Chief Complaint  Patient presents with   Dysuria   Urinary Frequency    HPI Raymond Morris is a 87 y.o. male.   HPI  Raymond Morris.  Here with his daughter.  He is having urinary symptoms.  He has known BPH.  He has been on Flomax for many years.  Recently it became worse.  He went to see a urologist.  They started him on unknown medicine to "shrink his prostate".  He felt like it might have made him worse.  Today after a brief time taking it.  This was perhaps a month ago.  For the last 5 days he has been having increasing symptoms.  More frequency.  Point difficulty passing urine.  He states that as soon as he starts his stream of urine he feels a strain in his bladder and urethra.  Difficulty emptying his bladder.  Getting up more at night to urinate.  No fever or chills.  He has also developed some rectal pain and pressure.  Some pain that radiates through to his back.  No pain in kidneys or flank.  No nausea or vomiting.  Had a normal bowel movement this morning.  Patient has multiple medical problems.  Hypertension, vascular disease, hyperlipidemia, GERD.  He is compliant with regular visits to his internal medicine physician as well as his cardiologist.  Recently started seeing a urologist.  Past Medical History:  Diagnosis Date   AAA (abdominal aortic aneurysm)    AAA (abdominal aortic aneurysm)    Anxiety    Arthritis    "thumbs; hands" (10/08/2013)   Asthma    Carotid artery occlusion    Cellulitis of right foot 10/2013   Chronic bronchitis ([place])    "just about q yr"   Chronic lower back pain    COPD (chronic obstructive pulmonary disease) ([place])    CVA (cerebral infarction)    per MRA done 2002   Depression    Diverticulosis    Edema    chronic, lower extremities   GERD (gastroesophageal reflux disease)    Gout    11/2013 surgeries    Hiatal hernia    Hypercholesterolemia    Hyperlipidemia    Hypertension    Monoclonal gammopathy    DR Marin Olp, 2007,felt to be benign   Phlebitis    right leg   Pneumonia "9 times"   (10/08/2013)   Stroke [facility]) 2002   "didn't know I'd had one; just told me about it in 2015"   Upper respiratory infection    "I've had lots"   Urinary frequency    elevated PSA , with prostate nodule, Dr [id number]    Patient Active Problem List   Diagnosis Date Noted   Cellulitis 10/08/2013   Cellulitis of foot, right 10/08/2013   Hypokalemia 10/08/2013   Essential hypertension, benign 10/08/2013   Hyperlipidemia 10/08/2013   GERD (gastroesophageal reflux disease) 10/08/2013   Abdominal aneurysm without mention of rupture 11/10/2010    Past Surgical History:  Procedure Laterality Date   APPENDECTOMY     COLONOSCOPY N/A 08/13/2015   Procedure: COLONOSCOPY;  Surgeon: Carol Ada, MD;  Location: [facility];  Service: Endoscopy;  Laterality: N/A;   EXCISIONAL HEMORRHOIDECTOMY     HERNIA REPAIR     UMBILICAL HERNIA REPAIR     VASECTOMY  Home Medications    Prior to Admission medications   Medication Sig Start Date End Date Taking? Authorizing Provider  ciprofloxacin (CIPRO) 500 MG tablet Take 1 tablet (500 mg total) by mouth every 12 (twelve) hours. 10/21/20  Yes Raylene Everts, MD  finasteride (PROSCAR) 5 MG tablet Take 1 tablet (5 mg total) by mouth daily. 10/21/20  Yes Raylene Everts, MD  olmesartan (BENICAR) 20 MG tablet Take 20 mg by mouth daily.   Yes [provider]  phenazopyridine (PYRIDIUM) 200 MG tablet Take 1 tablet (200 mg total) by mouth 3 (three) times daily. 10/21/20  Yes Raylene Everts, MD  aspirin EC 81 MG tablet Take 81 mg by mouth daily.    [provider]  carvedilol (COREG) 12.5 MG tablet Take 12.5 mg by mouth 2 (two) times daily with a meal.    [provider]  cetirizine (ZYRTEC) 10 MG chewable tablet Chew 10 mg by  mouth daily.    [provider]  escitalopram (LEXAPRO) 20 MG tablet Take 20 mg by mouth daily. 1/2 -1 tablet once daily    [provider]  esomeprazole (NEXIUM) 20 MG capsule Take 20 mg by mouth daily before breakfast.    [provider]  furosemide (LASIX) 20 MG tablet  06/04/20   [provider]  omega-3 acid ethyl esters (LOVAZA) 1 G capsule Take 3 g by mouth daily. Take 2 capsules daily    [provider]  rosuvastatin (CRESTOR) 20 MG tablet Take 10 mg by mouth daily.    [provider]  Tamsulosin HCl (FLOMAX) 0.4 MG CAPS Take 0.4 mg by mouth daily.    [provider]    Family History Family History  Problem Relation Age of Onset   Cancer Mother    Cancer Father    Stroke Sister    Liver disease Sister    Pneumonia Daughter        pnemoccoal, deceased   Cerebral palsy Son     Social History Social History   Tobacco Use   Smoking status: Former    Packs/day: 0.50    Years: 28.00    Pack years: 14.00    Types: Cigarettes    Quit date: 09/07/1976    Years since quitting: 44.1   Smokeless tobacco: Never  Vaping Use   Vaping Use: Never used  Substance Use Topics   Alcohol use: No    Alcohol/week: 0.0 standard drinks   Drug use: No     Allergies   Ceclor [cefaclor], Bactrim [sulfamethoxazole-trimethoprim], and Sulfa antibiotics Sulfa antibiotics cause rash  Review of Systems Review of Systems See HPI  Physical Exam Triage Vital Signs ED Triage Vitals  Enc Vitals Group     BP 10/21/20 1704 (!) 151/82     Pulse Rate 10/21/20 1704 60     Resp 10/21/20 1704 20     Temp 10/21/20 1704 98.5 F (36.9 C)     Temp Source 10/21/20 1704 Oral     SpO2 10/21/20 1704 94 %     Weight 10/21/20 1656 250 lb (113.4 kg)     Height 10/21/20 1656 5' 8.5" (1.74 m)     Head Circumference --      Peak Flow --      Pain Score 10/21/20 1656 8     Pain Loc --      Pain Edu? --      Excl. in [place]? --    No data  found.  Updated Vital Signs BP (!) 151/82 (BP Location: Left Arm)   Pulse 60   Temp 98.5 F (36.9 C) (Oral)   Resp 20   Ht 5' 8.5" (1.74 m)   Wt 113.4 kg   SpO2 94%   BMI 37.46 kg/m      Physical Exam Constitutional:      General: He is not in acute distress.    Appearance: He is well-developed. He is obese.     Comments: Appears uncomfortable.  Protuberant abdomen.  HENT:     Head: Normocephalic and atraumatic.     Nose:     Comments: Mask is in place Eyes:     Conjunctiva/sclera: Conjunctivae normal.     Pupils: Pupils are equal, round, and reactive to light.  Cardiovascular:     Rate and Rhythm: Normal rate and regular rhythm.     Heart sounds: Normal heart sounds.  Pulmonary:     Effort: Pulmonary effort is normal. No respiratory distress.     Breath sounds: Normal breath sounds. No wheezing or rales.  Abdominal:     General: There is no distension.     Palpations: Abdomen is soft.     Tenderness: There is no abdominal tenderness. There is no right CVA tenderness or left CVA tenderness.  Musculoskeletal:        General: Normal range of motion.     Cervical back: Normal range of motion and neck supple.     Comments: No CVA tenderness.  Mild tenderness left SI region.  No palpable muscle spasm of lumbar spine.  Skin:    General: Skin is warm and dry.  Neurological:     Mental Status: He is alert.  Psychiatric:        Behavior: Behavior normal.     Comments: Hard of hearing     UC Treatments / Results  Labs (all labs ordered are listed, but only abnormal results are displayed) Labs Reviewed  POCT URINALYSIS DIP (MANUAL ENTRY) - Abnormal; Notable for the following components:      Result Value   Protein Ur, POC =30 (*)    Urobilinogen, UA 4.0 (*)    All other components within normal limits  URINE CULTURE    EKG   Radiology No results found.  Procedures Procedures (including critical care time)  Medications Ordered in UC Medications  ketorolac  (TORADOL) 30 MG/ML injection 30 mg (has no administration in time range)    Initial Impression / Assessment and Plan / UC Course  I have reviewed the triage vital signs and the nursing notes.  Pertinent labs & imaging results that were available during my care of the patient were reviewed by me and considered in my medical decision making (see chart for details).     Patient likely has a prostate infection.  With rectal pain through to his back, and his prostate symptoms I think this is likely.  I am going to culture his urine although it did not show any signs of infection.  Discussed rectal exam but patient states that he is too uncomfortable to attempt.  We will treat with Cipro given his allergic reaction with Septra.  Questionable allergy to cephalosporins.  Going to give him Pyridium and hope this helps with the dysuria and pressure with urination.  I am not giving him narcotics for pain, I am afraid this will worsen his urinary difficulties.  I checked his recent lab work, 3 months ago.  Liver function kidney functions  are normal.  Believe he can tolerate short of Toradol.  I do recommend he go back on finasteride.  He will consider.  He needs to see his primary care doctor by next week Final Clinical Impressions(s) / UC Diagnoses   Final diagnoses:  Dysuria  Acute prostatitis     Discharge Instructions      I have prescribed an antibiotic called Cipro.  Take this pill 2 times a day. I have also prescribed Pyridium.  It is a urinary numbing medicine.  It will take away some of the burning and trouble urinating.  It causes your urine to be bright orange I have also prescribed Proscar (finasteride).  This is a medicine that helps shrink the prostate.  Finasteride may be the same medicine the urologist recommended, but I would like for you to try it again I am giving you a shot for pain.  This will help you rest and get some sleep tonight.  Tomorrow try Tylenol for pain You might need  to go to the emergency room if you get worse, have fevers, or stop peeing Call and make an appointment to see your primary care doctor next week   ED Prescriptions     Medication Sig Dispense Auth. Provider   finasteride (PROSCAR) 5 MG tablet Take 1 tablet (5 mg total) by mouth daily. 30 tablet Raylene Everts, MD   phenazopyridine (PYRIDIUM) 200 MG tablet Take 1 tablet (200 mg total) by mouth 3 (three) times daily. 6 tablet Raylene Everts, MD   ciprofloxacin (CIPRO) 500 MG tablet Take 1 tablet (500 mg total) by mouth every 12 (twelve) hours. 20 tablet Raylene Everts, MD      PDMP not reviewed this encounter.   Raylene Everts, MD 10/21/20 [phone]

## 2020-10-23 LAB — URINE CULTURE
MICRO NUMBER:: 12532363
Result:: NO GROWTH
SPECIMEN QUALITY:: ADEQUATE

## 2020-11-01 DIAGNOSIS — N410 Acute prostatitis: Secondary | ICD-10-CM | POA: Diagnosis not present

## 2020-11-01 DIAGNOSIS — R339 Retention of urine, unspecified: Secondary | ICD-10-CM | POA: Diagnosis not present

## 2020-11-01 DIAGNOSIS — M1A9XX0 Chronic gout, unspecified, without tophus (tophi): Secondary | ICD-10-CM | POA: Diagnosis not present

## 2020-11-01 DIAGNOSIS — I10 Essential (primary) hypertension: Secondary | ICD-10-CM | POA: Diagnosis not present

## 2020-12-06 DIAGNOSIS — M1A9XX0 Chronic gout, unspecified, without tophus (tophi): Secondary | ICD-10-CM | POA: Diagnosis not present

## 2020-12-06 DIAGNOSIS — I10 Essential (primary) hypertension: Secondary | ICD-10-CM | POA: Diagnosis not present

## 2020-12-07 DIAGNOSIS — Z87438 Personal history of other diseases of male genital organs: Secondary | ICD-10-CM | POA: Diagnosis not present

## 2020-12-07 DIAGNOSIS — R7303 Prediabetes: Secondary | ICD-10-CM | POA: Diagnosis not present

## 2020-12-07 DIAGNOSIS — E7800 Pure hypercholesterolemia, unspecified: Secondary | ICD-10-CM | POA: Diagnosis not present

## 2020-12-07 DIAGNOSIS — Z8673 Personal history of transient ischemic attack (TIA), and cerebral infarction without residual deficits: Secondary | ICD-10-CM | POA: Diagnosis not present

## 2020-12-07 DIAGNOSIS — I10 Essential (primary) hypertension: Secondary | ICD-10-CM | POA: Diagnosis not present

## 2020-12-09 DIAGNOSIS — R3914 Feeling of incomplete bladder emptying: Secondary | ICD-10-CM | POA: Diagnosis not present

## 2020-12-09 DIAGNOSIS — N401 Enlarged prostate with lower urinary tract symptoms: Secondary | ICD-10-CM | POA: Diagnosis not present

## 2020-12-23 DIAGNOSIS — E7800 Pure hypercholesterolemia, unspecified: Secondary | ICD-10-CM | POA: Diagnosis not present

## 2020-12-23 DIAGNOSIS — Z8673 Personal history of transient ischemic attack (TIA), and cerebral infarction without residual deficits: Secondary | ICD-10-CM | POA: Diagnosis not present

## 2020-12-23 DIAGNOSIS — R7303 Prediabetes: Secondary | ICD-10-CM | POA: Diagnosis not present

## 2020-12-23 DIAGNOSIS — E876 Hypokalemia: Secondary | ICD-10-CM | POA: Diagnosis not present

## 2020-12-23 DIAGNOSIS — I10 Essential (primary) hypertension: Secondary | ICD-10-CM | POA: Diagnosis not present

## 2021-03-24 DIAGNOSIS — I10 Essential (primary) hypertension: Secondary | ICD-10-CM | POA: Diagnosis not present

## 2021-03-24 DIAGNOSIS — R7303 Prediabetes: Secondary | ICD-10-CM | POA: Diagnosis not present

## 2021-03-24 DIAGNOSIS — E7801 Familial hypercholesterolemia: Secondary | ICD-10-CM | POA: Diagnosis not present

## 2021-03-29 DIAGNOSIS — I6529 Occlusion and stenosis of unspecified carotid artery: Secondary | ICD-10-CM | POA: Diagnosis not present

## 2021-03-29 DIAGNOSIS — E7801 Familial hypercholesterolemia: Secondary | ICD-10-CM | POA: Diagnosis not present

## 2021-03-29 DIAGNOSIS — I10 Essential (primary) hypertension: Secondary | ICD-10-CM | POA: Diagnosis not present

## 2021-03-29 DIAGNOSIS — R7303 Prediabetes: Secondary | ICD-10-CM | POA: Diagnosis not present

## 2021-03-29 DIAGNOSIS — I714 Abdominal aortic aneurysm, without rupture, unspecified: Secondary | ICD-10-CM | POA: Diagnosis not present

## 2021-03-29 DIAGNOSIS — D472 Monoclonal gammopathy: Secondary | ICD-10-CM | POA: Diagnosis not present

## 2021-03-29 DIAGNOSIS — Z0000 Encounter for general adult medical examination without abnormal findings: Secondary | ICD-10-CM | POA: Diagnosis not present

## 2021-03-29 DIAGNOSIS — F339 Major depressive disorder, recurrent, unspecified: Secondary | ICD-10-CM | POA: Diagnosis not present

## 2021-03-29 DIAGNOSIS — I440 Atrioventricular block, first degree: Secondary | ICD-10-CM | POA: Diagnosis not present

## 2021-03-29 DIAGNOSIS — J45909 Unspecified asthma, uncomplicated: Secondary | ICD-10-CM | POA: Diagnosis not present

## 2021-04-04 NOTE — Progress Notes (Deleted)
? ? ?  Subjective:   ? ?CC: Neck and wrist pain ? ?I, Wendy Poet, LAT, ATC, am serving as scribe for Dr. Lynne Leader. ? ?HPI: Pt is a 88 y/o male presenting w/ c/o neck and wrist pain. ? ?Neck pain: ?-Radiating pain: ?-UE paresthesias: ?-Aggravating factors: ?-Treatments tried: ? ?Diagnostic testing: C-spine XR- 07/16/20; C-spine CT- 05/28/20 ? ?Wrist pain: ?-Swelling: ?-Aggravating factors: ?-Treatments tried:  ? ?Pertinent review of Systems: *** ? ?Relevant historical information: *** ? ? ?Objective:   ?There were no vitals filed for this visit. ?General: Well Developed, well nourished, and in no acute distress.  ? ?MSK: *** ? ?Lab and Radiology Results ?No results found for this or any previous visit (from the past 72 hour(s)). ?No results found. ? ? ? ?Impression and Recommendations:   ? ?Assessment and Plan: ?88 y.o. male with ***. ? ?PDMP not reviewed this encounter. ?No orders of the defined types were placed in this encounter. ? ?No orders of the defined types were placed in this encounter. ? ? ?Discussed warning signs or symptoms. Please see discharge instructions. Patient expresses understanding. ? ? ?*** ?

## 2021-04-05 ENCOUNTER — Ambulatory Visit: Payer: Medicare HMO | Admitting: Family Medicine

## 2021-05-24 DIAGNOSIS — E7800 Pure hypercholesterolemia, unspecified: Secondary | ICD-10-CM | POA: Diagnosis not present

## 2021-05-24 DIAGNOSIS — Z8673 Personal history of transient ischemic attack (TIA), and cerebral infarction without residual deficits: Secondary | ICD-10-CM | POA: Diagnosis not present

## 2021-05-24 DIAGNOSIS — I10 Essential (primary) hypertension: Secondary | ICD-10-CM | POA: Diagnosis not present

## 2021-05-24 DIAGNOSIS — R7303 Prediabetes: Secondary | ICD-10-CM | POA: Diagnosis not present

## 2021-06-07 ENCOUNTER — Other Ambulatory Visit: Payer: Self-pay | Admitting: *Deleted

## 2021-06-07 DIAGNOSIS — R0989 Other specified symptoms and signs involving the circulatory and respiratory systems: Secondary | ICD-10-CM

## 2021-06-07 DIAGNOSIS — I714 Abdominal aortic aneurysm, without rupture, unspecified: Secondary | ICD-10-CM

## 2021-06-13 DIAGNOSIS — N403 Nodular prostate with lower urinary tract symptoms: Secondary | ICD-10-CM | POA: Diagnosis not present

## 2021-06-13 DIAGNOSIS — R3914 Feeling of incomplete bladder emptying: Secondary | ICD-10-CM | POA: Diagnosis not present

## 2021-06-16 NOTE — Progress Notes (Signed)
[facility] [place]  ASSESSMENT / PLAN: 88 y.o. male with:  # infrarenal abdominal aortic aneurysm measuring 19m.  # very mild bilateral carotid artery stenosis  Recommend the following which can slow the progression of atherosclerosis and reduce the risk of major adverse cardiac / limb events:  Complete cessation from all tobacco products. Blood glucose control with goal A1c < 7%. Blood pressure control with goal blood pressure < 140/90 mmHg. Lipid reduction therapy with goal LDL-C <100 mg/dL (<70 if symptomatic from PAD).  Aspirin '81mg'$  PO QD.  Atorvastatin 40-'80mg'$  PO QD (or other "high intensity" statin therapy).  I counseled the patient that his carotid artery stenosis will likely never require intervention.  He has a small infrarenal abdominal aortic aneurysm which we will continue to monitor.  Follow-up with me in 1 to 2 years with repeat duplex.  CHIEF COMPLAINT: Surveillance of known abdominal aortic aneurysm carotid artery stenosis.  HISTORY OF PRESENT ILLNESS: Raymond ENGRAMis a 88y.o. male who presents to clinic for surveillance of carotid artery stenosis and abdominal aortic aneurysm.  The patient is doing well from a neurologic standpoint.  He reports no new focal symptoms.  Does have some chronic abdominal pain attributed to an umbilical hernia.  He has a known small infrarenal abdominal aortic aneurysm.  The bulk of the visit was spent discussing the natural history of abdominal aortic aneurysm disease and the rationale for surveillance.   Past Medical History:  Diagnosis Date   AAA (abdominal aortic aneurysm) ([facility]    [place]) ([facility]    Anxiety    Arthritis    "thumbs; hands" (10/08/2013)   Asthma    Carotid artery occlusion    Cellulitis of right foot 10/2013   [place] [place]    "just about q yr"   Chronic lower back pain    COPD (chronic obstructive pulmonary disease) (HCC)    CVA (cerebral infarction)     per MRA done 2002   Depression    Diverticulosis    Edema    chronic, lower extremities   GERD (gastroesophageal reflux disease)    Gout    11/2013 surgeries   Hiatal hernia    Hypercholesterolemia    Hyperlipidemia    Hypertension    Monoclonal gammopathy    DR EMarin Olp 2007,felt to be benign   Phlebitis    right leg   Pneumonia "9 times"   (10/08/2013)   Stroke ([facility] 2002   "didn't know I'd had one; just told me about it in 2015"   Upper respiratory infection    "I've had lots"   Urinary frequency    elevated PSA , with prostate nodule, Dr [id number]   Past Surgical History:  Procedure Laterality Date   APPENDECTOMY     COLONOSCOPY N/A 08/13/2015   Procedure: COLONOSCOPY;  Surgeon: PCarol Ada MD;  Location: [facility];  Service: Endoscopy;  Laterality: N/A;   EXCISIONAL HEMORRHOIDECTOMY     HERNIA REPAIR     UMBILICAL HERNIA REPAIR     VASECTOMY      Family History  Problem Relation Age of Onset   Cancer Mother    Cancer Father    Stroke Sister    Liver disease Sister    Pneumonia Daughter        pnemoccoal, deceased   Cerebral palsy Son     Social History   Socioeconomic History   Marital status: Widowed    Spouse name: Not  on file   Number of children: Not on file   Years of education: Not on file   Highest education level: Not on file  Occupational History   Not on file  Tobacco Use   Smoking status: Former    Packs/day: 0.50    Years: 28.00    Total pack years: 14.00    Types: Cigarettes    Quit date: 09/07/1976    Years since quitting: 44.8   Smokeless tobacco: Never  Vaping Use   Vaping Use: Never used  Substance and Sexual Activity   Alcohol use: No    Alcohol/week: 0.0 standard drinks of alcohol   Drug use: No   Sexual activity: Not on file  Other Topics Concern   Not on file  Social History Narrative   Not on file   Social Determinants of Health   Financial Resource Strain: Not on file  Food Insecurity: Not on file   Transportation Needs: Not on file  Physical Activity: Not on file  Stress: Not on file  Social Connections: Not on file  Intimate Partner Violence: Not on file    Allergies  Allergen Reactions   Ceclor [Cefaclor] Other (See Comments)    Causes muscle spasms   Bactrim [Sulfamethoxazole-Trimethoprim]    Spironolactone     Other reaction(s): gynecomastia   Sulfa Antibiotics Rash    Current Outpatient Medications  Medication Sig Dispense Refill   aspirin EC 81 MG tablet Take 81 mg by mouth daily.     carvedilol (COREG) 12.5 MG tablet Take 12.5 mg by mouth 2 (two) times daily with a meal.     cetirizine (ZYRTEC) 10 MG chewable tablet Chew 10 mg by mouth daily.     ciprofloxacin (CIPRO) 500 MG tablet Take 1 tablet (500 mg total) by mouth every 12 (twelve) hours. 20 tablet 0   escitalopram (LEXAPRO) 20 MG tablet Take 20 mg by mouth daily. 1/2 -1 tablet once daily     esomeprazole (NEXIUM) 20 MG capsule Take 20 mg by mouth daily before breakfast.     finasteride (PROSCAR) 5 MG tablet Take 1 tablet (5 mg total) by mouth daily. 30 tablet 0   furosemide (LASIX) 20 MG tablet      olmesartan (BENICAR) 20 MG tablet Take 20 mg by mouth daily.     omega-3 acid ethyl esters (LOVAZA) 1 G capsule Take 3 g by mouth daily. Take 2 capsules daily     phenazopyridine (PYRIDIUM) 200 MG tablet Take 1 tablet (200 mg total) by mouth 3 (three) times daily. 6 tablet 0   rosuvastatin (CRESTOR) 20 MG tablet Take 10 mg by mouth daily.     Tamsulosin HCl (FLOMAX) 0.4 MG CAPS Take 0.4 mg by mouth daily.     No current facility-administered medications for this visit.    PHYSICAL EXAM Vitals:   06/17/21 0944  BP: 139/81  Pulse: (!) 54  Resp: 20  Temp: 98.2 F (36.8 C)  SpO2: 95%  Weight: 234 lb (106.1 kg)  Height: 5' 8.5" (1.74 m)    Constitutional: Well-appearing elderly man in no acute distress Cardiac: regular rate and rhythm.  Respiratory:  unlabored. Abdominal:  soft, tender about midline  fascial defect about the umbilicus.  Non-distended.   PERTINENT LABORATORY AND RADIOLOGIC DATA  Most recent CBC    Latest Ref Rng & Units 05/28/2020    4:34 PM 01/19/2014   12:13 PM 10/10/2013    5:00 AM  CBC  WBC 4.0 - 10.5 K/uL  8.3   6.7   Hemoglobin 13.0 - 17.0 g/dL 13.3  15.3  12.2   Hematocrit 39.0 - 52.0 % 40.2  45.0  36.3   Platelets 150 - 400 K/uL 192   210      Most recent CMP    Latest Ref Rng & Units 05/28/2020    4:34 PM 01/19/2014   12:13 PM 10/10/2013    5:00 AM  CMP  Glucose 70 - 99 mg/dL 113  125  100   BUN 8 - 23 mg/dL '16  19  13   '$ Creatinine 0.61 - 1.24 mg/dL 0.94  0.90  1.09   Sodium 135 - 145 mmol/L 135  140  142   Potassium 3.5 - 5.1 mmol/L 4.1  3.4  3.7   Chloride 98 - 111 mmol/L 99  100  101   CO2 22 - 32 mmol/L 27   30   Calcium 8.9 - 10.3 mg/dL 9.0   8.6     Abdominal Aorta: There is evidence of abnormal dilatation of the mid and  distal Abdominal aorta. The largest aortic measurement is 4.1 cm. The  largest aortic diameter has increased compared to prior exam. Previous  diameter measurement was 3.7 cm obtained   on 07/24/2017.   1-39% carotid artery stenosis bilaterally  Yevonne Aline. Stanford Breed, MD [facility] Phone Number: [phone] 06/17/2021 4:15 PM  Total time spent on preparing this encounter including chart review, data review, collecting history, examining the patient, coordinating care for this established patient, 40 minutes.  Portions of this report may have been transcribed using voice recognition software.  Every effort has been made to ensure accuracy; however, inadvertent computerized transcription errors may still be present.

## 2021-06-17 ENCOUNTER — Ambulatory Visit (INDEPENDENT_AMBULATORY_CARE_PROVIDER_SITE_OTHER)
Admission: RE | Admit: 2021-06-17 | Discharge: 2021-06-17 | Disposition: A | Discharge status: Home / Self Care | Payer: Medicare HMO | Source: Ambulatory Visit | Attending: Vascular Surgery | Admitting: Vascular Surgery

## 2021-06-17 ENCOUNTER — Ambulatory Visit: Payer: Medicare HMO | Admitting: Vascular Surgery

## 2021-06-17 ENCOUNTER — Encounter: Payer: Self-pay | Admitting: Vascular Surgery

## 2021-06-17 ENCOUNTER — Ambulatory Visit (HOSPITAL_COMMUNITY)
Admission: RE | Admit: 2021-06-17 | Discharge: 2021-06-17 | Disposition: A | Discharge status: Home / Self Care | Payer: Medicare HMO | Source: Ambulatory Visit | Attending: Vascular Surgery | Admitting: Vascular Surgery

## 2021-06-17 VITALS — BP 139/81 | HR 54 | Temp 98.2°F | Resp 20 | Ht 68.5 in | Wt 234.0 lb

## 2021-06-17 DIAGNOSIS — I714 Abdominal aortic aneurysm, without rupture, unspecified: Secondary | ICD-10-CM

## 2021-06-17 DIAGNOSIS — I6523 Occlusion and stenosis of bilateral carotid arteries: Secondary | ICD-10-CM

## 2021-06-17 DIAGNOSIS — I7143 Infrarenal abdominal aortic aneurysm, without rupture: Secondary | ICD-10-CM | POA: Diagnosis not present

## 2021-06-17 DIAGNOSIS — R0989 Other specified symptoms and signs involving the circulatory and respiratory systems: Secondary | ICD-10-CM | POA: Diagnosis not present

## 2021-08-08 ENCOUNTER — Other Ambulatory Visit: Payer: Self-pay

## 2021-08-08 NOTE — Telephone Encounter (Signed)
error 

## 2021-08-25 DIAGNOSIS — R7303 Prediabetes: Secondary | ICD-10-CM | POA: Diagnosis not present

## 2021-08-25 DIAGNOSIS — I10 Essential (primary) hypertension: Secondary | ICD-10-CM | POA: Diagnosis not present

## 2021-08-25 DIAGNOSIS — E7800 Pure hypercholesterolemia, unspecified: Secondary | ICD-10-CM | POA: Diagnosis not present

## 2021-09-01 DIAGNOSIS — Z8673 Personal history of transient ischemic attack (TIA), and cerebral infarction without residual deficits: Secondary | ICD-10-CM | POA: Diagnosis not present

## 2021-09-01 DIAGNOSIS — R5383 Other fatigue: Secondary | ICD-10-CM | POA: Diagnosis not present

## 2021-09-01 DIAGNOSIS — E7800 Pure hypercholesterolemia, unspecified: Secondary | ICD-10-CM | POA: Diagnosis not present

## 2021-09-01 DIAGNOSIS — I10 Essential (primary) hypertension: Secondary | ICD-10-CM | POA: Diagnosis not present

## 2021-11-04 ENCOUNTER — Emergency Department (HOSPITAL_COMMUNITY): Payer: Medicare HMO

## 2021-11-04 ENCOUNTER — Encounter (HOSPITAL_COMMUNITY): Payer: Self-pay

## 2021-11-04 ENCOUNTER — Emergency Department (HOSPITAL_COMMUNITY)
Admission: EM | Admit: 2021-11-04 | Discharge: 2021-11-04 | Disposition: A | Discharge status: Home / Self Care | Payer: Medicare HMO | Attending: Emergency Medicine | Admitting: Emergency Medicine

## 2021-11-04 ENCOUNTER — Ambulatory Visit
Admission: RE | Admit: 2021-11-04 | Discharge: 2021-11-04 | Disposition: A | Discharge status: Home / Self Care | Payer: Medicare HMO | Source: Ambulatory Visit | Attending: Internal Medicine | Admitting: Internal Medicine

## 2021-11-04 ENCOUNTER — Other Ambulatory Visit: Payer: Self-pay

## 2021-11-04 VITALS — BP 104/63 | HR 66 | Temp 98.1°F | Resp 16

## 2021-11-04 DIAGNOSIS — I10 Essential (primary) hypertension: Secondary | ICD-10-CM | POA: Insufficient documentation

## 2021-11-04 DIAGNOSIS — J4521 Mild intermittent asthma with (acute) exacerbation: Secondary | ICD-10-CM

## 2021-11-04 DIAGNOSIS — I959 Hypotension, unspecified: Secondary | ICD-10-CM | POA: Diagnosis not present

## 2021-11-04 DIAGNOSIS — R059 Cough, unspecified: Secondary | ICD-10-CM | POA: Insufficient documentation

## 2021-11-04 DIAGNOSIS — J189 Pneumonia, unspecified organism: Secondary | ICD-10-CM | POA: Diagnosis not present

## 2021-11-04 DIAGNOSIS — J069 Acute upper respiratory infection, unspecified: Secondary | ICD-10-CM

## 2021-11-04 DIAGNOSIS — R7981 Abnormal blood-gas level: Secondary | ICD-10-CM

## 2021-11-04 DIAGNOSIS — J449 Chronic obstructive pulmonary disease, unspecified: Secondary | ICD-10-CM | POA: Diagnosis not present

## 2021-11-04 DIAGNOSIS — R0902 Hypoxemia: Secondary | ICD-10-CM | POA: Diagnosis not present

## 2021-11-04 DIAGNOSIS — D72829 Elevated white blood cell count, unspecified: Secondary | ICD-10-CM | POA: Insufficient documentation

## 2021-11-04 DIAGNOSIS — Z7982 Long term (current) use of aspirin: Secondary | ICD-10-CM | POA: Insufficient documentation

## 2021-11-04 DIAGNOSIS — R0602 Shortness of breath: Secondary | ICD-10-CM | POA: Diagnosis present

## 2021-11-04 DIAGNOSIS — Z20822 Contact with and (suspected) exposure to covid-19: Secondary | ICD-10-CM | POA: Diagnosis not present

## 2021-11-04 DIAGNOSIS — Z79899 Other long term (current) drug therapy: Secondary | ICD-10-CM | POA: Diagnosis not present

## 2021-11-04 LAB — BASIC METABOLIC PANEL
Anion gap: 11 (ref 5–15)
BUN: 16 mg/dL (ref 8–23)
CO2: 24 mmol/L (ref 22–32)
Calcium: 9.3 mg/dL (ref 8.9–10.3)
Chloride: 103 mmol/L (ref 98–111)
Creatinine, Ser: 1.03 mg/dL (ref 0.61–1.24)
GFR, Estimated: >60 mL/min (ref >60)
Glucose, Bld: 118 mg/dL — ABNORMAL HIGH (ref 70–99)
Potassium: 3.7 mmol/L (ref 3.5–5.1)
Sodium: 138 mmol/L (ref 135–145)

## 2021-11-04 LAB — CBC
HCT: 38.7 % — ABNORMAL LOW (ref 39.0–52.0)
Hemoglobin: 13.1 g/dL (ref 13.0–17.0)
MCH: 32.6 pg (ref 26.0–34.0)
MCHC: 33.9 g/dL (ref 30.0–36.0)
MCV: 96.3 fL (ref 80.0–100.0)
Platelets: 176 10*3/uL (ref 150–400)
RBC: 4.02 MIL/uL — ABNORMAL LOW (ref 4.22–5.81)
RDW: 13.2 % (ref 11.5–15.5)
WBC: 11.7 10*3/uL — ABNORMAL HIGH (ref 4.0–10.5)
nRBC: 0 % (ref 0.0–0.2)

## 2021-11-04 LAB — LACTIC ACID, PLASMA: Lactic Acid, Venous: 1.4 mmol/L (ref 0.5–1.9)

## 2021-11-04 LAB — RESP PANEL BY RT-PCR (FLU A&B, COVID) ARPGX2
Influenza A by PCR: NEGATIVE
Influenza B by PCR: NEGATIVE
SARS Coronavirus 2 by RT PCR: NEGATIVE

## 2021-11-04 LAB — TROPONIN I (HIGH SENSITIVITY): Troponin I (High Sensitivity): 7 ng/L (ref <18)

## 2021-11-04 MED ORDER — BENZONATATE 100 MG PO CAPS: 200.0000 mg | ORAL_CAPSULE | Freq: Three times a day (TID) | ORAL | 0 refills | Status: AC | PRN

## 2021-11-04 MED ORDER — AMOXICILLIN-POT CLAVULANATE 875-125 MG PO TABS
1.0000 | ORAL_TABLET | Freq: Once | ORAL | Status: AC
  Administered 2021-11-04: 1 via ORAL
  Filled 2021-11-04: qty 1

## 2021-11-04 MED ORDER — LACTATED RINGERS IV BOLUS
1000.0000 mL | Freq: Once | INTRAVENOUS | Status: AC
  Administered 2021-11-04: 1000 mL via INTRAVENOUS

## 2021-11-04 MED ORDER — AZITHROMYCIN 250 MG PO TABS
500.0000 mg | ORAL_TABLET | Freq: Once | ORAL | Status: AC
  Administered 2021-11-04: 500 mg via ORAL
  Filled 2021-11-04: qty 2

## 2021-11-04 MED ORDER — AMOXICILLIN-POT CLAVULANATE 875-125 MG PO TABS: 1.0000 | ORAL_TABLET | Freq: Two times a day (BID) | ORAL | 0 refills | Status: AC

## 2021-11-04 MED ORDER — AZITHROMYCIN 250 MG PO TABS: 250.0000 mg | ORAL_TABLET | Freq: Every evening | ORAL | 0 refills | Status: DC

## 2021-11-04 NOTE — ED Triage Notes (Signed)
From UC via EMS with c/o hypotenstion and hypoxia. Pt subscribes to productive cough x 3 days. Denies fever. Upon arrival to UC, pt BP 90s/50s and O2 was 91% on RA.

## 2021-11-04 NOTE — ED Notes (Signed)
Patient is being discharged from the Urgent Care and sent to the Emergency Department via EMS . Per Oswaldo Conroy NP, patient is in need of higher level of care due to being Hypotensive and hypoxia. Patient is aware and verbalizes understanding of plan of care.  Vitals:   11/04/21 1304  BP: (!) 90/54  Pulse: 66  Resp: 16  Temp: 98.1 F (36.7 C)  SpO2: 91%

## 2021-11-04 NOTE — ED Provider Notes (Signed)
Hypotension, sepsis w/u. Physical Exam  BP 96/61   Pulse 74   Temp 98.2 F (36.8 C) (Oral)   Resp 20   Ht '5\' 8"'$  (1.727 m)   Wt 108.9 kg   SpO2 95%   BMI 36.49 kg/m   Physical Exam  Procedures  Procedures  ED Course / MDM   Clinical Course as of 11/24/21 0908  Fri Nov 04, 2021  1758 There was an oxygen saturation less than 87%.  This was with a poor pleth-patient satting in the upper 90s at rest [MR]    Clinical Course User Index [MR] Redwine, Cecilio Asper, PA-C   Medical Decision Making Amount and/or Complexity of Data Reviewed Labs: ordered. Radiology: ordered.  Risk Prescription drug management.   After completing assessment patient is improved and blood pressures are normotensive.  He has tolerated antibiotics and treatment well.  Plan will be for continued outpatient management with strict return precautions reviewed.       Charlesetta Shanks, MD 11/24/21 [phone]

## 2021-11-04 NOTE — ED Provider Notes (Signed)
[facility] EMERGENCY DEPARTMENT Provider Note   CSN: [id number] Arrival date & time: 11/04/21  1410     History  Chief Complaint  Patient presents with   Shortness of Breath   Cough    Raymond Morris is a 88 y.o. male.   Shortness of Breath Associated symptoms: cough   Cough Associated symptoms: shortness of breath    Patient is an 88 year old male with a past medical history significant for HTN, HLD, stroke, AAA, COPD presented to emergency room today with complaint of cough for 4 days.  Patient was seen at urgent care earlier today and noted to have low blood pressure.  He was sent emergently to the ED.  He states that he has no chest pain or any significant difficulty breathing.  He does state that it hurts in his chest when he coughs but otherwise has no pain.  No hemoptysis no lightheadedness or dizziness.  Denies any new leg swelling states his legs actually seem less swollen than usual.  He states that he has had sinus congestion for the past 2 weeks.  He reports he has a history of either asthma or COPD.  He states that he does have a inhaler that he has not used in quite some time.       Home Medications Prior to Admission medications   Medication Sig Start Date End Date Taking? Authorizing Provider  aspirin EC 81 MG tablet Take 81 mg by mouth daily.    [provider]  carvedilol (COREG) 12.5 MG tablet Take 12.5 mg by mouth 2 (two) times daily with a meal.    [provider]  cetirizine (ZYRTEC) 10 MG chewable tablet Chew 10 mg by mouth daily.    [provider]  ciprofloxacin (CIPRO) 500 MG tablet Take 1 tablet (500 mg total) by mouth every 12 (twelve) hours. 10/21/20   Raylene Everts, MD  escitalopram (LEXAPRO) 20 MG tablet Take 20 mg by mouth daily. 1/2 -1 tablet once daily    [provider]  esomeprazole (NEXIUM) 20 MG capsule Take 20 mg by mouth daily before breakfast.    [provider]   finasteride (PROSCAR) 5 MG tablet Take 1 tablet (5 mg total) by mouth daily. 10/21/20   Raylene Everts, MD  furosemide (LASIX) 20 MG tablet  06/04/20   [provider]  olmesartan (BENICAR) 20 MG tablet Take 20 mg by mouth daily.    [provider]  omega-3 acid ethyl esters (LOVAZA) 1 G capsule Take 3 g by mouth daily. Take 2 capsules daily    [provider]  phenazopyridine (PYRIDIUM) 200 MG tablet Take 1 tablet (200 mg total) by mouth 3 (three) times daily. 10/21/20   Raylene Everts, MD  rosuvastatin (CRESTOR) 20 MG tablet Take 10 mg by mouth daily.    [provider]  Tamsulosin HCl (FLOMAX) 0.4 MG CAPS Take 0.4 mg by mouth daily.    [provider]      Allergies    Ceclor [cefaclor], Bactrim [sulfamethoxazole-trimethoprim], Spironolactone, and Sulfa antibiotics    Review of Systems   Review of Systems  Respiratory:  Positive for cough and shortness of breath.     Physical Exam Updated Vital Signs BP (!) 107/57   Pulse 61   Temp 98.2 F (36.8 C) (Oral)   Resp 17   Ht '5\' 8"'$  (1.727 m)   Wt 108.9 kg   SpO2 100%   BMI 36.49 kg/m  Physical Exam Vitals and nursing note reviewed.  Constitutional:      General: He is not in acute distress. HENT:     Head: Normocephalic and atraumatic.     Nose: Nose normal.  Eyes:     General: No scleral icterus. Cardiovascular:     Rate and Rhythm: Normal rate and regular rhythm.     Pulses: Normal pulses.     Heart sounds: Normal heart sounds.  Pulmonary:     Effort: Pulmonary effort is normal. No respiratory distress.     Breath sounds: No wheezing.     Comments: Speaking in full sentences, no tachypnea or wheezing or crackles. Abdominal:     Palpations: Abdomen is soft.     Tenderness: There is no abdominal tenderness.  Musculoskeletal:     Cervical back: Normal range of motion.     Right lower leg: No edema.     Left lower leg: No edema.  Skin:    General: Skin is warm and  dry.     Capillary Refill: Capillary refill takes less than 2 seconds.  Neurological:     Mental Status: He is alert. Mental status is at baseline.  Psychiatric:        Mood and Affect: Mood normal.        Behavior: Behavior normal.     ED Results / Procedures / Treatments   Labs (all labs ordered are listed, but only abnormal results are displayed) Labs Reviewed  BASIC METABOLIC PANEL - Abnormal; Notable for the following components:      Result Value   Glucose, Bld 118 (*)    All other components within normal limits  CBC - Abnormal; Notable for the following components:   WBC 11.7 (*)    RBC 4.02 (*)    HCT 38.7 (*)    All other components within normal limits  RESP PANEL BY RT-PCR (FLU A&B, COVID) [id number]  CULTURE, BLOOD (ROUTINE X 2)  CULTURE, BLOOD (ROUTINE X 2)  LACTIC ACID, PLASMA  LACTIC ACID, PLASMA  TROPONIN I (HIGH SENSITIVITY)    EKG EKG Interpretation  Date/Time:  Friday November 04 2021 14:43:19 EDT Ventricular Rate:  61 PR Interval:  222 QRS Duration: 104 QT Interval:  453 QTC Calculation: 457 R Axis:   -25 Text Interpretation: Sinus rhythm Prolonged PR interval Borderline left axis deviation Abnormal R-wave progression, early transition Borderline T abnormalities, inferior leads Baseline wander in lead(s) V2 No significant change since last tracing Confirmed by Isla Pence [phone]) on 11/04/2021 3:15:35 PM  Radiology No results found.  Procedures Procedures    Medications Ordered in ED Medications  lactated ringers bolus 1,000 mL (1,000 mLs Intravenous New Bag/Given 11/04/21 1504)    ED Course/ Medical Decision Making/ A&P                           Medical Decision Making Amount and/or Complexity of Data Reviewed Labs: ordered. Radiology: ordered.   Patient is an 88 year old male with a past medical history significant for HTN, HLD, stroke, AAA, COPD presented to emergency room today with complaint of cough for 4 days.  Patient was  seen at urgent care earlier today and noted to have low blood pressure.  He was sent emergently to the ED.  He states that he has no chest pain or any significant difficulty breathing.  He does state that it hurts in his chest when he coughs but otherwise has no pain.  No hemoptysis no lightheadedness or dizziness.  Denies any new leg swelling states his legs actually seem less swollen than usual.  He states that he has had sinus congestion for the past 2 weeks.  He reports he has a history of either asthma or COPD.  He states that he does have a inhaler that he has not used in quite some time.  Initial evaluation quite reassuring.  Patient has had some variable blood pressures here in the ER.  Seems that his blood pressures are with MAP greater than 65 predominantly.  He is mentating well.  Does not feel unwell.  We will screen for COVID, flu, obtain lactate to see if there is any significant elevation greater than 2 that would indicate improvement for perfusion/hypotension.  Patient with mild leukocytosis 11.7 otherwise reassuring CBC and BMP.  If chest x-ray reassuring and blood pressure improved with anticipate discharge home otherwise will require admission for pneumonia if found on chest x-ray.   4:29 PM Care of '@PATIENTNAME'$ @ transferred to PA Redwine and Dr. Vallery Ridge at the end of my shift as the patient will require reassessment once labs/imaging have resulted. Patient presentation, ED course, and plan of care discussed with review of all pertinent labs and imaging. Please see his/her note for further details regarding further ED course and disposition. This plan may be altered or completely changed at the discretion of the oncoming team pending results of further workup.   Final Clinical Impression(s) / ED Diagnoses Final diagnoses:  None    Rx / DC Orders ED Discharge Orders     None         Tedd Sias, [place] 11/04/21 1629    Isla Pence, MD 11/05/21 [phone]

## 2021-11-04 NOTE — ED Provider Notes (Signed)
  Care assumed from [facility], [place]. Please see note for further details.  In short patient is an 88 year old male who was sent from urgent care.  For the last 2 weeks he has been experiencing rhinorrhea and then now has developed a cough.  He has had multiple diagnoses of pneumonia and to urgent care his oxygen saturations were around 91 with a soft blood pressure.  Since he has been in our department his blood pressures have been variable, hypotensive to normotensive.  Currently patient has 1 L of fluids ordered.  Plan is for me to follow-up on fluids as well as his chest x-ray and disposition accordingly.  The thought is that if patient continues to look well, remained normotensive and ambulate the department without a drop in oxygen saturations he may be discharged home.  Otherwise he will require admission.  Physical Exam  BP (!) 101/58   Pulse (!) 58   Temp 98.2 F (36.8 C) (Oral)   Resp 15   Ht '5\' 8"'$  (1.727 m)   Wt 108.9 kg   SpO2 95%   BMI 36.49 kg/m   Physical Exam Vitals and nursing note reviewed.  Constitutional:      Appearance: Normal appearance.  HENT:     Head: Normocephalic and atraumatic.  Eyes:     General: No scleral icterus.    Conjunctiva/sclera: Conjunctivae normal.  Pulmonary:     Effort: Pulmonary effort is normal. No respiratory distress.     Comments: Wet cough Skin:    Findings: No rash.  Neurological:     Mental Status: He is alert.  Psychiatric:        Mood and Affect: Mood normal.     Procedures  Procedures  ED Course / MDM   Clinical Course as of 11/04/21 1842  Fri Nov 04, 2021  1758 There was an oxygen saturation less than 87%.  This was with a poor pleth-patient satting in the upper 90s at rest [MR]    Clinical Course User Index [MR] Manahil Vanzile, Cecilio Asper, PA-C   Medical Decision Making Amount and/or Complexity of Data Reviewed Labs: ordered. Radiology: ordered.  Risk Prescription drug management.   Chest x-ray revealing of  bibasilar opacities, larger than previous exams.  In the setting of patient's hypoxia, soft blood pressures, shortness of breath and cough I will treat him for a community-acquired pneumonia.   5:15p: Patient ambulated on his own to the restroom.  6:35pm: I personally watched the patient ambulate with the nurse tech.  Oxygen saturations remain within good limits.  Most recent temperature 99.2, increasing my suspicion for potential pneumonia.  Will be DC'd after antibiotics    Rhae Hammock, PA-C 11/04/21 1843    Charlesetta Shanks, MD 11/24/21 [phone]

## 2021-11-04 NOTE — Discharge Instructions (Addendum)
Patient sent to the hospital via EMS. 

## 2021-11-04 NOTE — ED Triage Notes (Signed)
Pt is present today with productive cough and chest congestion. Pt sx started x2 days ago

## 2021-11-04 NOTE — Discharge Instructions (Addendum)
You presented to the emergency department due to your cough and some shortness of breath.  Your x-ray today is suspicious for pneumonia.  We will treat this with antibiotics.  You received your first doses in the department so you do not need to start the rest until tomorrow morning.  I have sent the remainder of the following to your pharmacy Azithromycin Amoxicillin/Clavulanate Benzonatate for cough  Please schedule an appointment with your primary care doctor next week to assure that your symptoms are improving.  Return with any worsening symptoms, especially shortness of breath, fevers, chest pain or feelings as though you may lose consciousness.

## 2021-11-04 NOTE — ED Provider Notes (Signed)
[facility]    CSN: [id number] Arrival date & time: 11/04/21  1247      History   Chief Complaint Chief Complaint  Patient presents with   Cough    HPI Raymond Morris is a 88 y.o. male.   Patient presents with nasal congestion and productive cough that has been present for about 2 days.  Patient reports that he has intermittent shortness of breath and feelings of chest congestion as well.  Denies any known fevers or sick contacts at home.  Patient reports that he has asthma but denies that he has COPD.   Cough   Past Medical History:  Diagnosis Date   AAA (abdominal aortic aneurysm) ([place])    AAA (abdominal aortic aneurysm) ([place])    Anxiety    Arthritis    "thumbs; hands" (10/08/2013)   Asthma    Carotid artery occlusion    Cellulitis of right foot 10/2013   Chronic bronchitis ([place])    "just about q yr"   Chronic lower back pain    COPD (chronic obstructive pulmonary disease) ([place])    CVA (cerebral infarction)    per MRA done 2002   Depression    Diverticulosis    Edema    chronic, lower extremities   GERD (gastroesophageal reflux disease)    Gout    11/2013 surgeries   Hiatal hernia    Hypercholesterolemia    Hyperlipidemia    Hypertension    Monoclonal gammopathy    DR Marin Olp, 2007,felt to be benign   Phlebitis    right leg   Pneumonia "9 times"   (10/08/2013)   Stroke [facility]) 2002   "didn't know I'd had one; just told me about it in 2015"   Upper respiratory infection    "I've had lots"   Urinary frequency    elevated PSA , with prostate nodule, Dr [id number]    Patient Active Problem List   Diagnosis Date Noted   Cellulitis 10/08/2013   Cellulitis of foot, right 10/08/2013   Hypokalemia 10/08/2013   Essential hypertension, benign 10/08/2013   Hyperlipidemia 10/08/2013   GERD (gastroesophageal reflux disease) 10/08/2013   Abdominal aneurysm without mention of rupture 11/10/2010    Past Surgical History:  Procedure Laterality  Date   APPENDECTOMY     COLONOSCOPY N/A 08/13/2015   Procedure: COLONOSCOPY;  Surgeon: Carol Ada, MD;  Location: [facility];  Service: Endoscopy;  Laterality: N/A;   EXCISIONAL HEMORRHOIDECTOMY     HERNIA REPAIR     UMBILICAL HERNIA REPAIR     VASECTOMY         Home Medications    Prior to Admission medications   Medication Sig Start Date End Date Taking? Authorizing Provider  aspirin EC 81 MG tablet Take 81 mg by mouth daily.    [provider]  carvedilol (COREG) 12.5 MG tablet Take 12.5 mg by mouth 2 (two) times daily with a meal.    [provider]  cetirizine (ZYRTEC) 10 MG chewable tablet Chew 10 mg by mouth daily.    [provider]  ciprofloxacin (CIPRO) 500 MG tablet Take 1 tablet (500 mg total) by mouth every 12 (twelve) hours. 10/21/20   Raylene Everts, MD  escitalopram (LEXAPRO) 20 MG tablet Take 20 mg by mouth daily. 1/2 -1 tablet once daily    [provider]  esomeprazole (NEXIUM) 20 MG capsule Take 20 mg by mouth daily before breakfast.    [provider]  finasteride (PROSCAR) 5  MG tablet Take 1 tablet (5 mg total) by mouth daily. 10/21/20   Raylene Everts, MD  furosemide (LASIX) 20 MG tablet  06/04/20   [provider]  olmesartan (BENICAR) 20 MG tablet Take 20 mg by mouth daily.    [provider]  omega-3 acid ethyl esters (LOVAZA) 1 G capsule Take 3 g by mouth daily. Take 2 capsules daily    [provider]  phenazopyridine (PYRIDIUM) 200 MG tablet Take 1 tablet (200 mg total) by mouth 3 (three) times daily. 10/21/20   Raylene Everts, MD  rosuvastatin (CRESTOR) 20 MG tablet Take 10 mg by mouth daily.    [provider]  Tamsulosin HCl (FLOMAX) 0.4 MG CAPS Take 0.4 mg by mouth daily.    [provider]    Family History Family History  Problem Relation Age of Onset   Cancer Mother    Cancer Father    Stroke Sister    Liver disease Sister    Pneumonia  Daughter        pnemoccoal, deceased   Cerebral palsy Son     Social History Social History   Tobacco Use   Smoking status: Former    Packs/day: 0.50    Years: 28.00    Total pack years: 14.00    Types: Cigarettes    Quit date: 09/07/1976    Years since quitting: 45.1   Smokeless tobacco: Never  Vaping Use   Vaping Use: Never used  Substance Use Topics   Alcohol use: No    Alcohol/week: 0.0 standard drinks of alcohol   Drug use: No     Allergies   Ceclor [cefaclor], Bactrim [sulfamethoxazole-trimethoprim], Spironolactone, and Sulfa antibiotics   Review of Systems Review of Systems Per HPI  Physical Exam Triage Vital Signs ED Triage Vitals [11/04/21 1304]  Enc Vitals Group     BP (!) 90/54     Pulse Rate 66     Resp 16     Temp 98.1 F (36.7 C)     Temp src      SpO2 91 %     Weight      Height      Head Circumference      Peak Flow      Pain Score 0     Pain Loc      Pain Edu?      Excl. in [place]?    No data found.  Updated Vital Signs BP 104/63   Pulse 66   Temp 98.1 F (36.7 C)   Resp 16   SpO2 94%   Visual Acuity Right Eye Distance:   Left Eye Distance:   Bilateral Distance:    Right Eye Near:   Left Eye Near:    Bilateral Near:     Physical Exam Constitutional:      General: He is not in acute distress.    Appearance: Normal appearance. He is not toxic-appearing or diaphoretic.  HENT:     Head: Normocephalic and atraumatic.     Ears:     Comments: Deferred given low oxygen    Nose:     Comments: Deferred given low oxygen    Mouth/Throat:     Comments: Deferred given low oxygen Eyes:     Comments: Deferred given low oxygen  Cardiovascular:     Rate and Rhythm: Normal rate and regular rhythm.     Pulses: Normal pulses.     Heart sounds: Normal heart sounds.  Pulmonary:     Effort: Pulmonary effort is normal. No respiratory distress.     Breath sounds: Normal breath sounds. No stridor. No wheezing, rhonchi or rales.   Musculoskeletal:        General: Normal range of motion.     Cervical back: Normal range of motion.  Skin:    General: Skin is warm and dry.  Neurological:     General: No focal deficit present.     Mental Status: He is alert and oriented to person, place, and time. Mental status is at baseline.  Psychiatric:        Mood and Affect: Mood normal.        Behavior: Behavior normal.      UC Treatments / Results  Labs (all labs ordered are listed, but only abnormal results are displayed) Labs Reviewed - No data to display  EKG   Radiology No results found.  Procedures Procedures (including critical care time)  Medications Ordered in UC Medications - No data to display  Initial Impression / Assessment and Plan / UC Course  I have reviewed the triage vital signs and the nursing notes.  Pertinent labs & imaging results that were available during my care of the patient were reviewed by me and considered in my medical decision making (see chart for details).     Suspect the patient has some sort of viral illness that is causing exacerbation of asthma.  Patient's oxygen saturation was 90 to 91% during initial triage and physical exam.  3 L nasal cannula was applied with improvement in oxygen.  Patient's blood pressure was low normal which is different from patient's baseline which is concerning as well.  Advised patient that he will need to go to the hospital for further evaluation and management and he was agreeable with plan.  Patient was agreeable with going to the hospital by EMS as well.  Patient left via EMS. Final Clinical Impressions(s) / UC Diagnoses   Final diagnoses:  Low oxygen saturation  Viral upper respiratory tract infection with cough  Mild intermittent asthma with acute exacerbation     Discharge Instructions      Patient sent to the hospital via EMS.     ED Prescriptions   None    PDMP not reviewed this encounter.   Teodora Medici, [place] 11/04/21  1332

## 2021-11-09 LAB — CULTURE, BLOOD (ROUTINE X 2)
Culture: NO GROWTH
Special Requests: ADEQUATE

## 2021-12-14 IMAGING — DX DG CHEST 2V
2 series · 2 of 2 positions shown · non-contrast
Comparison: Chest radiograph report 11/27/2017, images not
retrievable at the time of exam. Chest radiograph images from
07/26/2007.

CLINICAL DATA: Chest pain

EXAM:
CHEST - 2 VIEW

[chest pa]
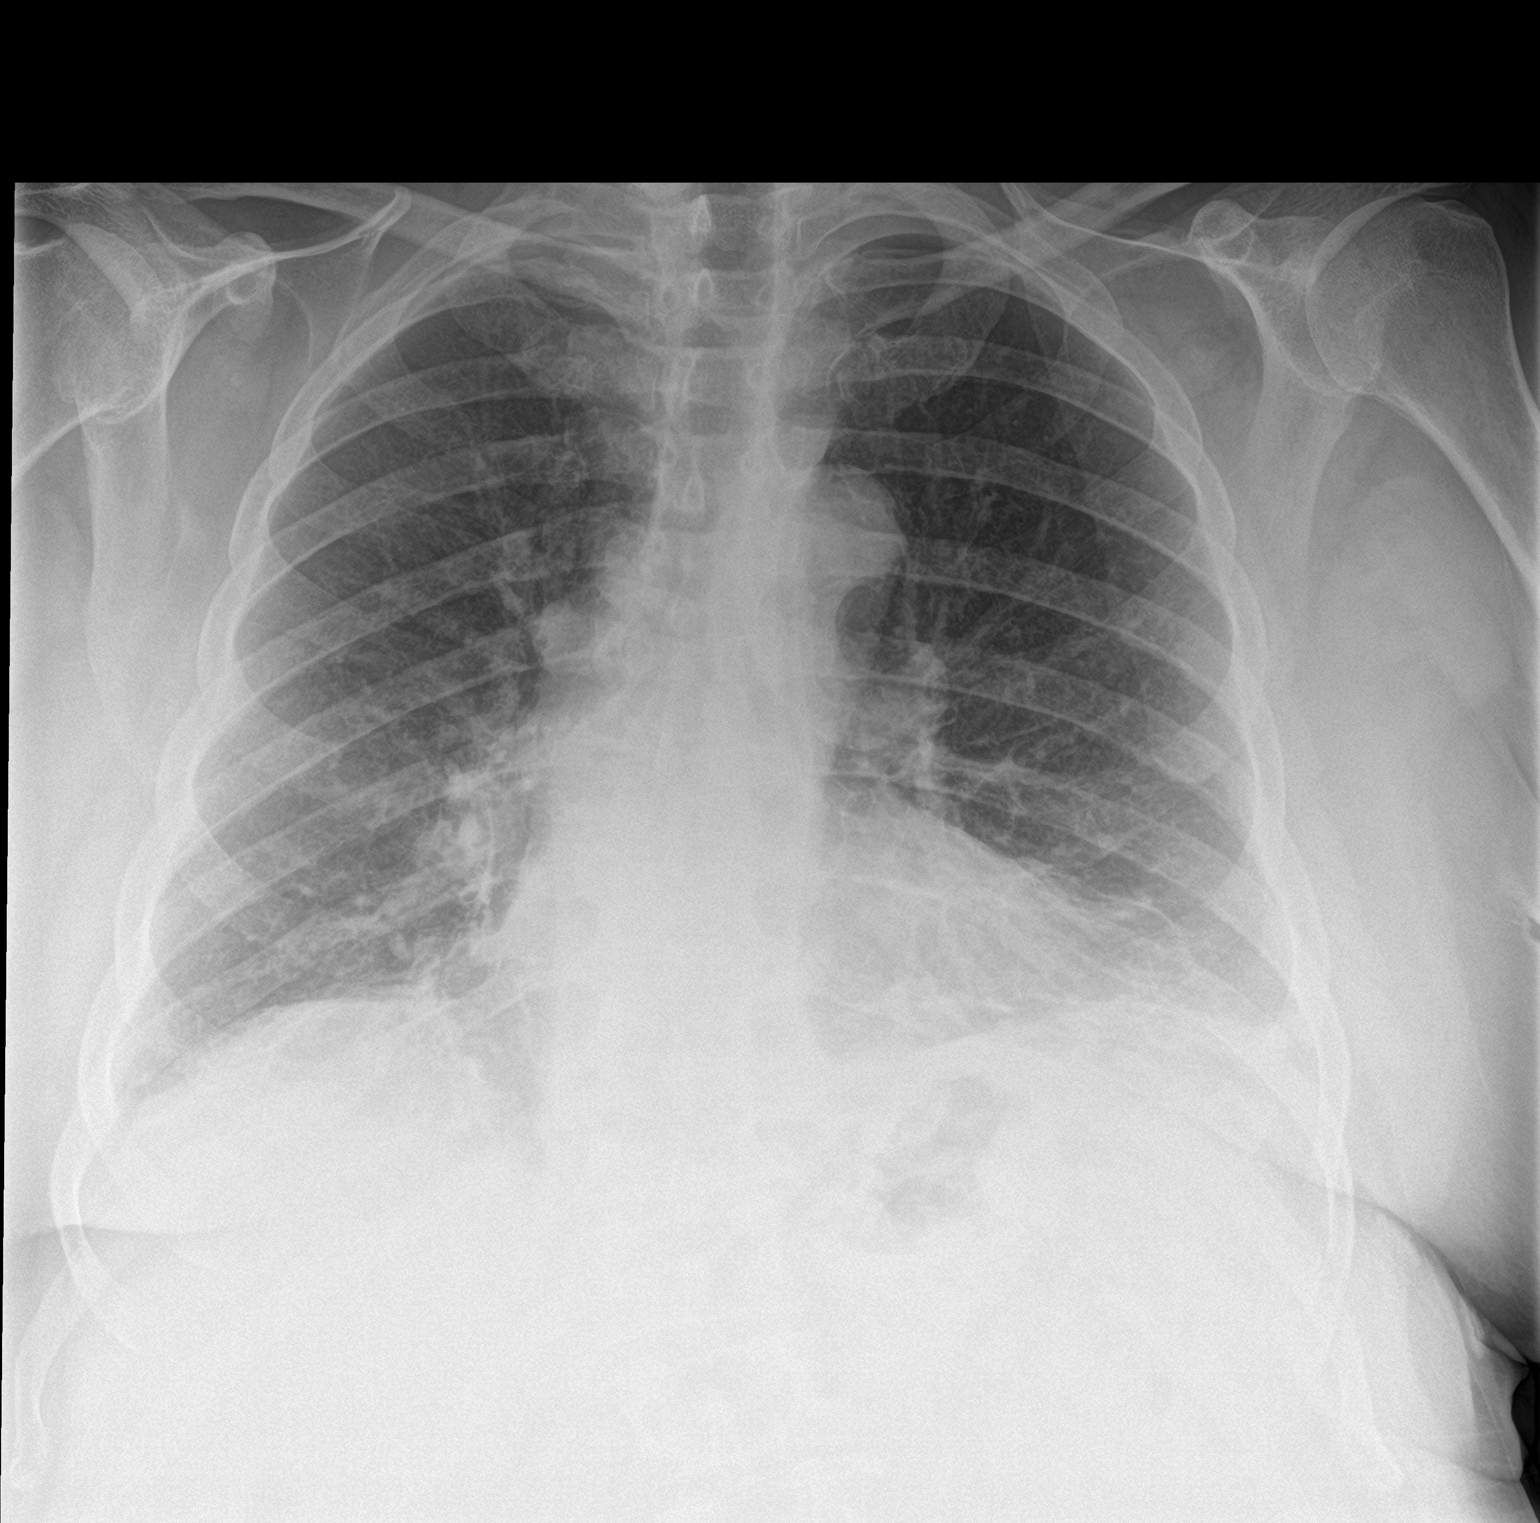

[chest lat]
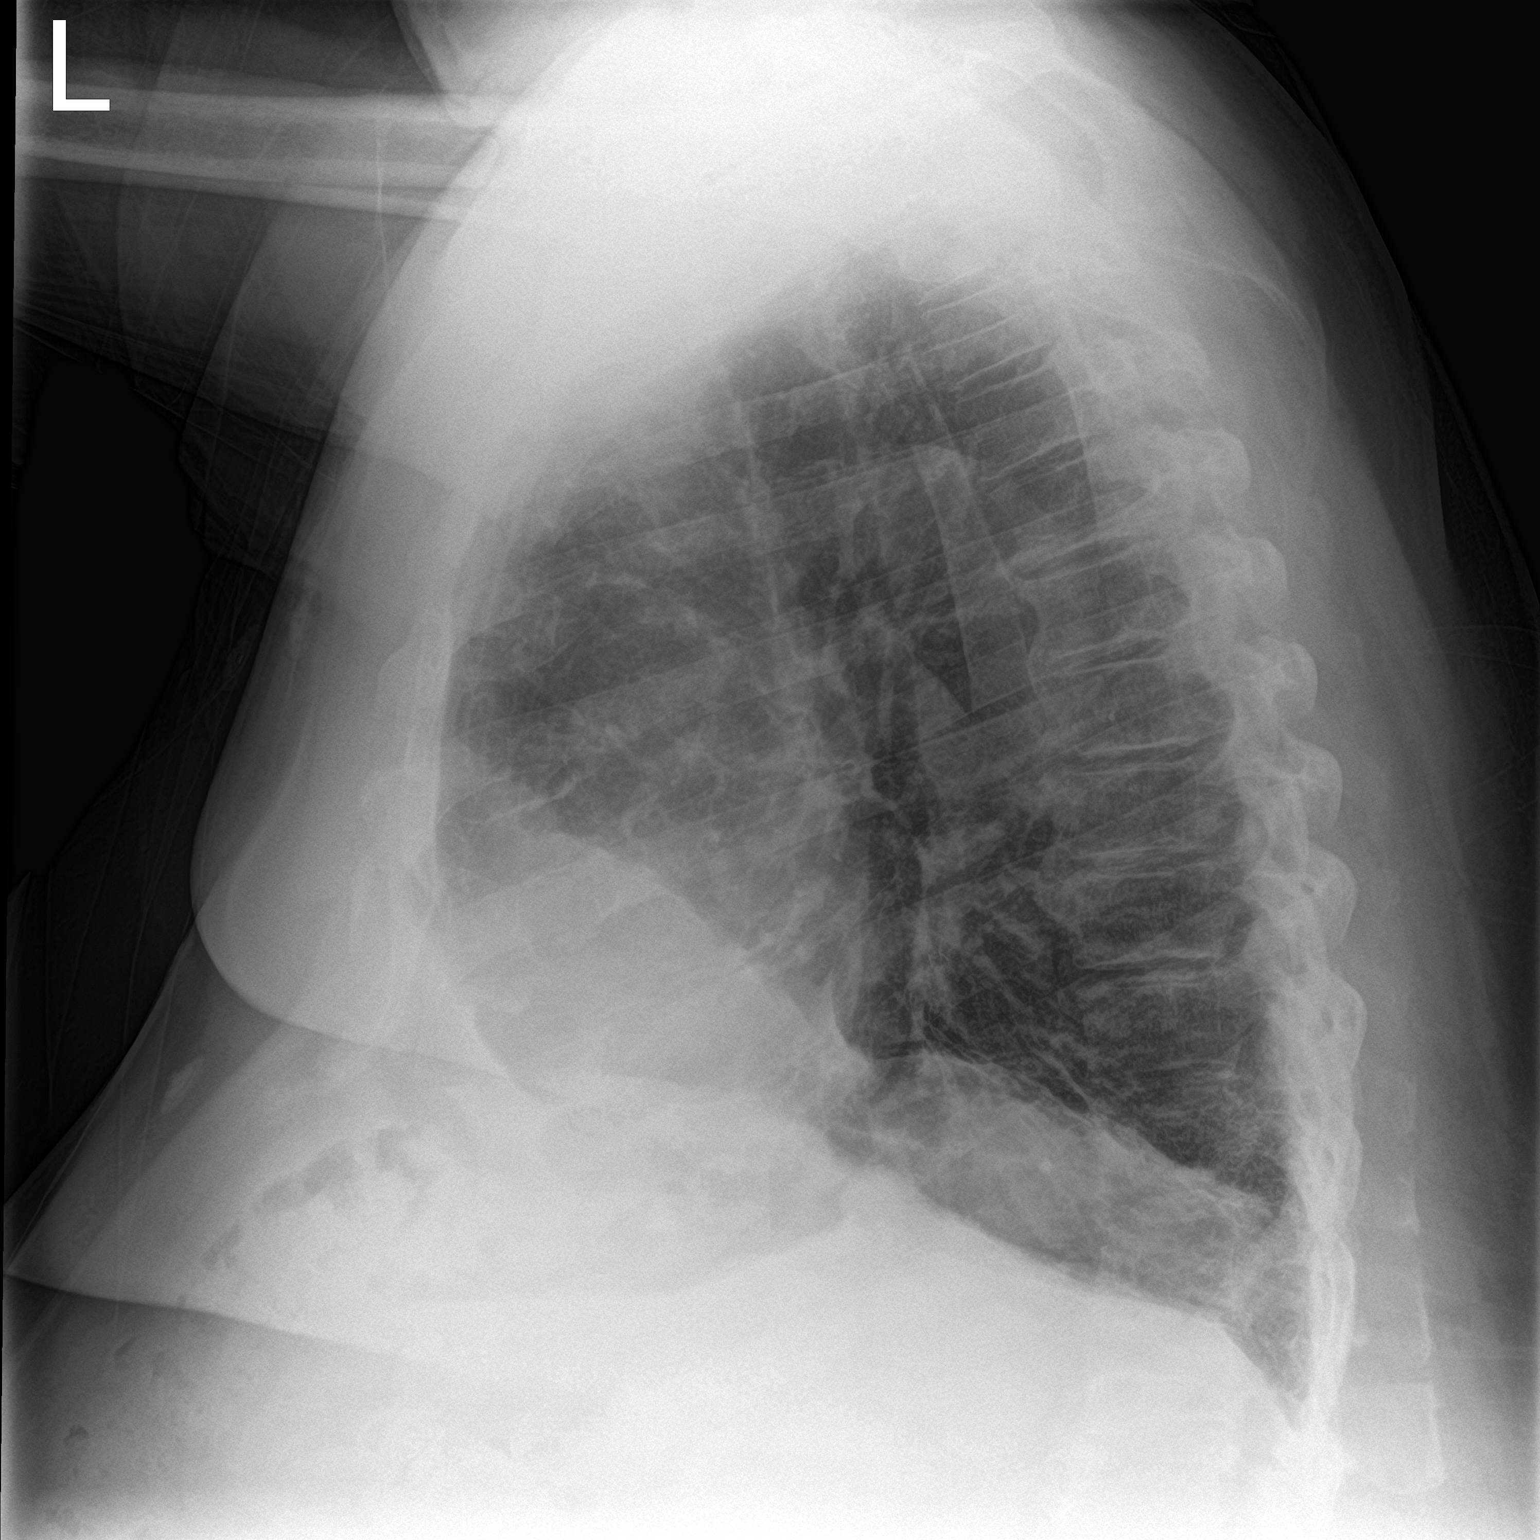

[2 of 2 positions shown; findings below may reference images not displayed]

FINDINGS: Unchanged cardiomediastinal silhouette. There are bibasilar streaky
opacities, likely scarring and atelectasis. There is no new focal
airspace consolidation. Thoracic spondylosis.
IMPRESSION: Streaky bibasilar opacities, likely scarring and/or atelectasis. No
focal airspace consolidation.

## 2021-12-21 ENCOUNTER — Ambulatory Visit (INDEPENDENT_AMBULATORY_CARE_PROVIDER_SITE_OTHER): Payer: Medicare HMO

## 2021-12-21 ENCOUNTER — Ambulatory Visit
Admission: RE | Admit: 2021-12-21 | Discharge: 2021-12-21 | Disposition: A | Discharge status: Home / Self Care | Payer: Medicare HMO | Source: Ambulatory Visit | Attending: Internal Medicine | Admitting: Internal Medicine

## 2021-12-21 VITALS — BP 94/66 | HR 60 | Temp 98.0°F | Resp 20

## 2021-12-21 DIAGNOSIS — R059 Cough, unspecified: Secondary | ICD-10-CM

## 2021-12-21 DIAGNOSIS — R053 Chronic cough: Secondary | ICD-10-CM | POA: Diagnosis not present

## 2021-12-21 DIAGNOSIS — R0989 Other specified symptoms and signs involving the circulatory and respiratory systems: Secondary | ICD-10-CM

## 2021-12-21 MED ORDER — PREDNISONE 20 MG PO TABS: 20.0000 mg | ORAL_TABLET | Freq: Every day | ORAL | 0 refills | Status: AC

## 2021-12-21 NOTE — ED Triage Notes (Signed)
Pt c/o chest congestion, cough. States was seen a few weeks ago dx with PNA and has not fully recovered but did get better. Now symptoms are returning.

## 2021-12-21 NOTE — ED Provider Notes (Signed)
[facility]    CSN: [id number] Arrival date & time: 12/21/21  1500      History   Chief Complaint Chief Complaint  Patient presents with   Cough    Lung congestion - Entered by patient    HPI Raymond Morris is a 88 y.o. male.   Patient presents today with productive cough and feelings of chest congestion that has been present for multiple weeks.  Patient was originally seen on 11/04/2021 for symptoms.  He arrived to the urgent care and was sent to the ED given low blood pressure and oxygen saturation.  He was found to have pneumonia at the ED and was treated with Augmentin and azithromycin with minimal improvement of symptoms.  Patient reports improvement but not complete resolution of symptoms.  He denies any chest pain, shortness of breath, fever.  Although, patient does report that he has had intermittent chills.  Patient denies history of asthma or COPD but does report that he has history of asthma.   Cough   Past Medical History:  Diagnosis Date   AAA (abdominal aortic aneurysm) ([place])    AAA (abdominal aortic aneurysm) ([place])    Anxiety    Arthritis    "thumbs; hands" (10/08/2013)   Asthma    Carotid artery occlusion    Cellulitis of right foot 10/2013   Chronic bronchitis ([place])    "just about q yr"   Chronic lower back pain    COPD (chronic obstructive pulmonary disease) ([place])    CVA (cerebral infarction)    per MRA done 2002   Depression    Diverticulosis    Edema    chronic, lower extremities   GERD (gastroesophageal reflux disease)    Gout    11/2013 surgeries   Hiatal hernia    Hypercholesterolemia    Hyperlipidemia    Hypertension    Monoclonal gammopathy    DR Marin Olp, 2007,felt to be benign   Phlebitis    right leg   Pneumonia "9 times"   (10/08/2013)   Stroke [facility]) 2002   "didn't know I'd had one; just told me about it in 2015"   Upper respiratory infection    "I've had lots"   Urinary frequency    elevated PSA , with prostate  nodule, Dr [id number]    Patient Active Problem List   Diagnosis Date Noted   Cellulitis 10/08/2013   Cellulitis of foot, right 10/08/2013   Hypokalemia 10/08/2013   Essential hypertension, benign 10/08/2013   Hyperlipidemia 10/08/2013   GERD (gastroesophageal reflux disease) 10/08/2013   Abdominal aneurysm without mention of rupture 11/10/2010    Past Surgical History:  Procedure Laterality Date   APPENDECTOMY     COLONOSCOPY N/A 08/13/2015   Procedure: COLONOSCOPY;  Surgeon: Carol Ada, MD;  Location: [facility];  Service: Endoscopy;  Laterality: N/A;   EXCISIONAL HEMORRHOIDECTOMY     HERNIA REPAIR     UMBILICAL HERNIA REPAIR     VASECTOMY         Home Medications    Prior to Admission medications   Medication Sig Start Date End Date Taking? Authorizing Provider  predniSONE (DELTASONE) 20 MG tablet Take 1 tablet (20 mg total) by mouth daily for 5 days. 12/21/21 12/26/21 Yes Teodora Medici, FNP  aspirin EC 81 MG tablet Take 81 mg by mouth daily.    [provider]  azithromycin (ZITHROMAX) 250 MG tablet Take 1 tablet (250 mg total) by mouth at bedtime. Take first 2 tablets  together, then 1 every day until finished. 11/04/21   Redwine, Madison A, PA-C  carvedilol (COREG) 12.5 MG tablet Take 12.5 mg by mouth 2 (two) times daily with a meal.    [provider]  cetirizine (ZYRTEC) 10 MG chewable tablet Chew 10 mg by mouth daily.    [provider]  ciprofloxacin (CIPRO) 500 MG tablet Take 1 tablet (500 mg total) by mouth every 12 (twelve) hours. 10/21/20   Raylene Everts, MD  escitalopram (LEXAPRO) 20 MG tablet Take 20 mg by mouth daily. 1/2 -1 tablet once daily    [provider]  esomeprazole (NEXIUM) 20 MG capsule Take 20 mg by mouth daily before breakfast.    [provider]  finasteride (PROSCAR) 5 MG tablet Take 1 tablet (5 mg total) by mouth daily. 10/21/20   Raylene Everts, MD  furosemide (LASIX) 20 MG tablet   06/04/20   [provider]  olmesartan (BENICAR) 20 MG tablet Take 20 mg by mouth daily.    [provider]  omega-3 acid ethyl esters (LOVAZA) 1 G capsule Take 3 g by mouth daily. Take 2 capsules daily    [provider]  phenazopyridine (PYRIDIUM) 200 MG tablet Take 1 tablet (200 mg total) by mouth 3 (three) times daily. 10/21/20   Raylene Everts, MD  rosuvastatin (CRESTOR) 20 MG tablet Take 10 mg by mouth daily.    [provider]  Tamsulosin HCl (FLOMAX) 0.4 MG CAPS Take 0.4 mg by mouth daily.    [provider]    Family History Family History  Problem Relation Age of Onset   Cancer Mother    Cancer Father    Stroke Sister    Liver disease Sister    Pneumonia Daughter        pnemoccoal, deceased   Cerebral palsy Son     Social History Social History   Tobacco Use   Smoking status: Former    Packs/day: 0.50    Years: 28.00    Total pack years: 14.00    Types: Cigarettes    Quit date: 09/07/1976    Years since quitting: 45.3   Smokeless tobacco: Never  Vaping Use   Vaping Use: Never used  Substance Use Topics   Alcohol use: No    Alcohol/week: 0.0 standard drinks of alcohol   Drug use: No     Allergies   Ceclor [cefaclor], Bactrim [sulfamethoxazole-trimethoprim], Spironolactone, and Sulfa antibiotics   Review of Systems Review of Systems Per HPI  Physical Exam Triage Vital Signs ED Triage Vitals [12/21/21 1512]  Enc Vitals Group     BP (!) 95/58     Pulse Rate 60     Resp 20     Temp 98 F (36.7 C)     Temp Source Oral     SpO2 94 %     Weight      Height      Head Circumference      Peak Flow      Pain Score 0     Pain Loc      Pain Edu?      Excl. in [place]?    No data found.  Updated Vital Signs BP 94/66   Pulse 60   Temp 98 F (36.7 C) (Oral)   Resp 20   SpO2 97%   Visual Acuity Right Eye Distance:   Left Eye Distance:   Bilateral Distance:    Right Eye Near:  Left Eye Near:     Bilateral Near:     Physical Exam Constitutional:      General: He is not in acute distress.    Appearance: Normal appearance. He is not toxic-appearing or diaphoretic.  HENT:     Head: Normocephalic and atraumatic.     Right Ear: Tympanic membrane and ear canal normal.     Left Ear: Tympanic membrane and ear canal normal.     Nose: No congestion.     Mouth/Throat:     Mouth: Mucous membranes are moist.     Pharynx: No posterior oropharyngeal erythema.  Eyes:     Extraocular Movements: Extraocular movements intact.     Conjunctiva/sclera: Conjunctivae normal.     Pupils: Pupils are equal, round, and reactive to light.  Cardiovascular:     Rate and Rhythm: Normal rate and regular rhythm.     Pulses: Normal pulses.     Heart sounds: Normal heart sounds.  Pulmonary:     Effort: Pulmonary effort is normal. No respiratory distress.     Breath sounds: No stridor. Rhonchi present. No wheezing or rales.  Abdominal:     General: Abdomen is flat. Bowel sounds are normal.     Palpations: Abdomen is soft.  Musculoskeletal:        General: Normal range of motion.     Cervical back: Normal range of motion.  Skin:    General: Skin is warm and dry.  Neurological:     General: No focal deficit present.     Mental Status: He is alert and oriented to person, place, and time. Mental status is at baseline.  Psychiatric:        Mood and Affect: Mood normal.        Behavior: Behavior normal.      UC Treatments / Results  Labs (all labs ordered are listed, but only abnormal results are displayed) Labs Reviewed - No data to display  EKG   Radiology DG Chest 2 View  Result Date: 12/21/2021 CLINICAL DATA:  Cough and congestion. EXAM: CHEST - 2 VIEW COMPARISON:  11/04/2021 FINDINGS: Lungs are hyperexpanded. There is persistent bibasilar atelectasis or infiltrate, left greater than right. No substantial pleural effusion. Cardiopericardial silhouette is at upper limits of normal for size.  The visualized bony structures of the thorax are unremarkable. IMPRESSION: Persistent bibasilar atelectasis or infiltrate, left greater than right. Electronically Signed   By: Misty Stanley M.D.   On: 12/21/2021 15:39    Procedures Procedures (including critical care time)  Medications Ordered in UC Medications - No data to display  Initial Impression / Assessment and Plan / UC Course  I have reviewed the triage vital signs and the nursing notes.  Pertinent labs & imaging results that were available during my care of the patient were reviewed by me and considered in my medical decision making (see chart for details).     Chest x-ray showing similar findings from a previous ED visit.  Although, I have a low concern for persistent pneumonia given that he does not currently have any fevers and that he appears physically well on exam.  Oxygen saturation and vital signs are also stable.  Patient's blood pressure is low normal but this appears baseline over the past few weeks and no concern for any further workup necessary for this.  Do think patient would benefit from prednisone to decrease inflammation in chest and help alleviate cough.  Will prescribe a low-dose and short course of prednisone  and I do think benefits outweigh risks associated with the prednisone and patient's associated comorbidities.  I do not think that additional antibiotic therapy is necessary given appearance on physical exam and x-ray findings.  Advised patient to follow-up with PCP and/or pulmonologist at provided contact information for further evaluation and management.  He was also given strict return and ER precautions.  Patient verbalized understanding and was agreeable with plan. Final Clinical Impressions(s) / UC Diagnoses   Final diagnoses:  Persistent cough for 3 weeks or longer     Discharge Instructions      I have prescribed you prednisone to decrease inflammation to help alleviate cough.  Please follow-up  with primary care doctor and or pulmonologist at provided contact information for further evaluation and management.  Please go straight to the emergency department if you develop any shortness of breath or persistent symptoms.     ED Prescriptions     Medication Sig Dispense Auth. Provider   predniSONE (DELTASONE) 20 MG tablet Take 1 tablet (20 mg total) by mouth daily for 5 days. 5 tablet [place], Michele Rockers, [place]      PDMP not reviewed this encounter.   Teodora Medici, [place] 12/21/21 [phone]

## 2021-12-21 NOTE — Discharge Instructions (Addendum)
I have prescribed you prednisone to decrease inflammation to help alleviate cough.  Please follow-up with primary care doctor and or pulmonologist at provided contact information for further evaluation and management.  Please go straight to the emergency department if you develop any shortness of breath or persistent symptoms.

## 2022-01-04 ENCOUNTER — Encounter: Payer: Self-pay | Admitting: Student

## 2022-01-04 ENCOUNTER — Ambulatory Visit (INDEPENDENT_AMBULATORY_CARE_PROVIDER_SITE_OTHER): Payer: PPO | Admitting: Student

## 2022-01-04 ENCOUNTER — Telehealth: Payer: Self-pay | Admitting: Student

## 2022-01-04 VITALS — BP 112/70 | HR 52 | Temp 98.4°F | Ht 68.0 in | Wt 227.6 lb

## 2022-01-04 DIAGNOSIS — J411 Mucopurulent chronic bronchitis: Secondary | ICD-10-CM

## 2022-01-04 MED ORDER — ALBUTEROL SULFATE HFA 108 (90 BASE) MCG/ACT IN AERS: 2.0000 | INHALATION_SPRAY | Freq: Four times a day (QID) | RESPIRATORY_TRACT | 6 refills | Status: DC | PRN

## 2022-01-04 NOTE — Patient Instructions (Signed)
-   We will check to see what the least expensive LABA/ICS asthma controller inhaler is for you and we'll let you know by phone as soon as we hear back from pharmacy - albuterol every 4-6 hours as needed or 5-20 minutes before exertion - THIS IS YOUR RESCUE INHALER - try flonase 1 spray each nostril after clearing your nose out of crusting following a shower every day till next visit to take care of sinus congestion - see you in 3 months or sooner if need be!

## 2022-01-04 NOTE — Progress Notes (Signed)
Synopsis: Referred for asthma, dyspnea by Deland Pretty, MD  Subjective:   PATIENT ID: Raymond Morris GENDER: male DOB: 1931/04/28, MRN: [id number]  Chief Complaint  Patient presents with   Consult    Pt states that he had pneumonia at Thanksgiving and then it turned into the flu with a productive cough.     89yM with history of COPD/asthma, GERD/HH, MGUS referred for dyspnea  Last seen by cardiology 01/27/20. Did have prior abnormal stress and LHC recommended in past. Has deferred further workup in this regard for now.   Treated recently for CAP with UC visit given augmentin/azithro 11/04/21 and steroids at [facility] visit 12/21/21. He thinks he had strong response to each of these.His daughter reports however that they had influenza A just before his UC visit - he was not tested for this there nor given course of tamiflu.   Has had very productive cough over this time frame. Still not back to baseline overall, his cough has however improved. Does have some sinonasal congestion but unsure if postnasal drainage.   Has been off of wixela for maybe a year.   Otherwise pertinent review of systems is negative.  No family history of lung disease  80 py smoker or so. 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   Past Medical History:  Diagnosis Date   AAA (abdominal aortic aneurysm) ([place])    AAA (abdominal aortic aneurysm) ([place])    Anxiety    Arthritis    "thumbs; hands" (10/08/2013)   Asthma    Carotid artery occlusion    Cellulitis of right foot 10/2013   Chronic bronchitis ([place])    "just about q yr"   Chronic lower back pain    COPD (chronic obstructive pulmonary disease) (HCC)    CVA (cerebral infarction)    per MRA done 2002   Depression    Diverticulosis    Edema    chronic, lower extremities   GERD (gastroesophageal reflux disease)    Gout    11/2013 surgeries   Hiatal hernia    Hypercholesterolemia    Hyperlipidemia    Hypertension     Monoclonal gammopathy    DR Marin Olp, 2007,felt to be benign   Phlebitis    right leg   Pneumonia "9 times"   (10/08/2013)   Stroke [facility]) 2002   "didn't know I'd had one; just told me about it in 2015"   Upper respiratory infection    "I've had lots"   Urinary frequency    elevated PSA , with prostate nodule, Dr [id number]     Family History  Problem Relation Age of Onset   Cancer Mother    Cancer Father    Stroke Sister    Liver disease Sister    Pneumonia Daughter        pnemoccoal, deceased   Cerebral palsy Son      Past Surgical History:  Procedure Laterality Date   APPENDECTOMY     COLONOSCOPY N/A 08/13/2015   Procedure: COLONOSCOPY;  Surgeon: Carol Ada, MD;  Location: [facility];  Service: Endoscopy;  Laterality: N/A;   EXCISIONAL HEMORRHOIDECTOMY     HERNIA REPAIR     UMBILICAL HERNIA REPAIR     VASECTOMY      Social History   Socioeconomic History   Marital status: Widowed    Spouse name: Not on file   Number of children: Not on file   Years of education: Not on file  Highest education level: Not on file  Occupational History   Not on file  Tobacco Use   Smoking status: Former    Packs/day: 0.50    Years: 28.00    Total pack years: 14.00    Types: Cigarettes    Quit date: 09/07/1976    Years since quitting: 45.3   Smokeless tobacco: Never  Vaping Use   Vaping Use: Never used  Substance and Sexual Activity   Alcohol use: No    Alcohol/week: 0.0 standard drinks of alcohol   Drug use: No   Sexual activity: Not on file  Other Topics Concern   Not on file  Social History Narrative   Not on file   Social Determinants of Health   Financial Resource Strain: Not on file  Food Insecurity: Not on file  Transportation Needs: Not on file  Physical Activity: Not on file  Stress: Not on file  Social Connections: Not on file  Intimate Partner Violence: Not on file     Allergies  Allergen Reactions   Ceclor [Cefaclor] Other (See Comments)     Causes muscle spasms   Bactrim [Sulfamethoxazole-Trimethoprim]    Spironolactone     Other reaction(s): gynecomastia   Sulfa Antibiotics Rash     Outpatient Medications Prior to Visit  Medication Sig Dispense Refill   aspirin EC 81 MG tablet Take 81 mg by mouth daily.     carvedilol (COREG) 12.5 MG tablet Take 12.5 mg by mouth 2 (two) times daily with a meal.     cetirizine (ZYRTEC) 10 MG chewable tablet Chew 10 mg by mouth daily.     cetirizine (ZYRTEC) 10 MG tablet Take 10 mg by mouth daily.     ciprofloxacin (CIPRO) 500 MG tablet Take 1 tablet (500 mg total) by mouth every 12 (twelve) hours. 20 tablet 0   escitalopram (LEXAPRO) 20 MG tablet Take 20 mg by mouth daily. 1/2 -1 tablet once daily     esomeprazole (NEXIUM) 20 MG capsule Take 20 mg by mouth daily before breakfast.     finasteride (PROSCAR) 5 MG tablet Take 1 tablet (5 mg total) by mouth daily. 30 tablet 0   furosemide (LASIX) 20 MG tablet      olmesartan (BENICAR) 20 MG tablet Take 20 mg by mouth daily.     omega-3 acid ethyl esters (LOVAZA) 1 G capsule Take 3 g by mouth daily. Take 2 capsules daily     phenazopyridine (PYRIDIUM) 200 MG tablet Take 1 tablet (200 mg total) by mouth 3 (three) times daily. 6 tablet 0   potassium chloride (KLOR-CON) 10 MEQ tablet Take 10 mEq by mouth daily.     rosuvastatin (CRESTOR) 20 MG tablet Take 10 mg by mouth daily.     Tamsulosin HCl (FLOMAX) 0.4 MG CAPS Take 0.4 mg by mouth daily.     azithromycin (ZITHROMAX) 250 MG tablet Take 1 tablet (250 mg total) by mouth at bedtime. Take first 2 tablets together, then 1 every day until finished. 4 tablet 0   No facility-administered medications prior to visit.       Objective:   Physical Exam:  General appearance: 89 y.o., male, NAD, conversant, male, NAD, conversant  Eyes: anicteric sclerae; PERRL, tracking appropriately HENT: NCAT; MMM Neck: Trachea midline; no lymphadenopathy, no JVD Lungs: CTAB, no crackles, no wheeze, with normal respiratory effort CV:  RRR, no murmur  Abdomen: Soft, non-tender; non-distended, BS present  Extremities: No peripheral edema, warm Skin: Normal turgor and texture; no rash Psych: Appropriate affect  Neuro: Alert and oriented to person and place, no focal deficit     Vitals:   01/04/22 1538  BP: 112/70  Pulse: (!) 52  Temp: 98.4 F (36.9 C)  TempSrc: Oral  SpO2: 95%  Weight: 227 lb 9.6 oz (103.2 kg)  Height: '5\' 8"'$  (1.727 m)   95% on RA BMI Readings from Last 3 Encounters:  01/04/22 34.61 kg/m  11/04/21 36.49 kg/m  06/17/21 35.06 kg/m   Wt Readings from Last 3 Encounters:  01/04/22 227 lb 9.6 oz (103.2 kg)  11/04/21 240 lb (108.9 kg)  06/17/21 234 lb (106.1 kg)     CBC    Component Value Date/Time   WBC 11.7 (H) 11/04/2021 1430   RBC 4.02 (L) 11/04/2021 1430   HGB 13.1 11/04/2021 1430   HCT 38.7 (L) 11/04/2021 1430   PLT 176 11/04/2021 1430   MCV 96.3 11/04/2021 1430   MCH 32.6 11/04/2021 1430   MCHC 33.9 11/04/2021 1430   RDW 13.2 11/04/2021 1430   LYMPHSABS 1.1 05/28/2020 1634   MONOABS 0.9 05/28/2020 1634   EOSABS 0.2 05/28/2020 1634   BASOSABS 0.0 05/28/2020 1634    Eos 200 in 2022  Chest Imaging: CXR 12/21/21 unchanged relative to 05/28/20  Pulmonary Functions Testing Results:     No data to display          Echocardiogram 12/2019:    1. Left ventricular ejection fraction, by estimation, is 55 to 60%. The  left ventricle has normal function. The left ventricle has no regional  wall motion abnormalities. Left ventricular diastolic parameters are  consistent with Grade I diastolic  dysfunction (impaired relaxation).   2. Right ventricular systolic function is normal. The right ventricular  size is normal.   3. The mitral valve is grossly normal. Trivial mitral valve  regurgitation.   4. The aortic valve is tricuspid. There is mild calcification of the  aortic valve. Aortic valve regurgitation is not visualized. No aortic  stenosis is present.   5. The  inferior vena cava is normal in size with greater than 50%  respiratory variability, suggesting right atrial pressure of 3 mmHg.      Assessment & Plan:   # Recurrent bronchitis Sounds like he may have had AECOPD or asthma exacerbation in early November and then probably had influenza A infection in 12/20. He reports history of asthma vs COPD, no PFTs available. No wheeze on exam today.   Plan:  - We will check to see what the least expensive LABA/ICS asthma controller inhaler is for you and we'll let you know by phone as soon as we hear back from pharmacy - albuterol every 4-6 hours as needed or 5-20 minutes before exertion - THIS IS YOUR RESCUE INHALER - try flonase 1 spray each nostril after clearing your nose out of crusting following a shower every day till next visit to take care of sinus congestion - probably not worth PFTs with few age-matched controls available for comparison - see you in 3 months or sooner if need be!    Maryjane Hurter, MD [facility] 01/04/2022 3:46 PM

## 2022-01-04 NOTE — Telephone Encounter (Signed)
What is least expensive LABA/ICS inhaler for him?  Thanks!

## 2022-01-05 ENCOUNTER — Other Ambulatory Visit (HOSPITAL_COMMUNITY): Payer: Self-pay

## 2022-01-05 NOTE — Telephone Encounter (Signed)
Per benefits investigation it seems that Generic Advair Diskus [facility]) is the cheapest covered ICS/LABA for the patient at this time.

## 2022-01-11 ENCOUNTER — Telehealth: Payer: Self-pay | Admitting: Student

## 2022-01-11 NOTE — Telephone Encounter (Signed)
PT calling regarding Dr. Melvyn Novas note on AVS to call him back on the following:  We will check to see what the least expensive LABA/ICS asthma controller inhaler is for you and we'll let you know by phone as soon as we hear back from pharmacy   He has not heard back.   Pls call to advise. Phone # on file good.

## 2022-01-11 NOTE — Telephone Encounter (Signed)
Can we run a ticket for:  least expensive LABA/ICS asthma controller inhaler is for this patient  Thank you

## 2022-01-12 ENCOUNTER — Other Ambulatory Visit (HOSPITAL_COMMUNITY): Payer: Self-pay

## 2022-01-12 NOTE — Telephone Encounter (Signed)
Please see previous encounter from 01/04/2022.

## 2022-03-27 ENCOUNTER — Telehealth: Payer: Self-pay

## 2022-03-27 NOTE — Telephone Encounter (Signed)
I called PT to set up FU appt w/Dr. Verlee Monte.   While on the call he said that he was not taking the Generic Advair Diskus (Wixela). States he used to take Advair and I said this was the generic version. Also stated he NL had asthma.   In short, he said he read on the label that it would "cause death" so he stopped taking it.   Pls call pt to advise on warning label and if this is just for asthma or is their another reason Dr. Rx'd it?  [phone]

## 2022-03-28 NOTE — Telephone Encounter (Signed)
Called the pt and there was no answer, and no voicemail has been set up Will try back later  He has appt in April with Dr Verlee Monte and if he is doing well currently, this can be discussed at upcoming visit.  Will await call back

## 2022-04-04 DIAGNOSIS — F339 Major depressive disorder, recurrent, unspecified: Secondary | ICD-10-CM | POA: Diagnosis not present

## 2022-04-04 DIAGNOSIS — Z0000 Encounter for general adult medical examination without abnormal findings: Secondary | ICD-10-CM | POA: Diagnosis not present

## 2022-04-04 DIAGNOSIS — J45909 Unspecified asthma, uncomplicated: Secondary | ICD-10-CM | POA: Diagnosis not present

## 2022-04-04 DIAGNOSIS — I714 Abdominal aortic aneurysm, without rupture, unspecified: Secondary | ICD-10-CM | POA: Diagnosis not present

## 2022-04-04 DIAGNOSIS — K219 Gastro-esophageal reflux disease without esophagitis: Secondary | ICD-10-CM | POA: Diagnosis not present

## 2022-04-04 DIAGNOSIS — I6523 Occlusion and stenosis of bilateral carotid arteries: Secondary | ICD-10-CM | POA: Diagnosis not present

## 2022-04-04 DIAGNOSIS — Z23 Encounter for immunization: Secondary | ICD-10-CM | POA: Diagnosis not present

## 2022-04-04 DIAGNOSIS — M5136 Other intervertebral disc degeneration, lumbar region: Secondary | ICD-10-CM | POA: Diagnosis not present

## 2022-04-04 DIAGNOSIS — D472 Monoclonal gammopathy: Secondary | ICD-10-CM | POA: Diagnosis not present

## 2022-04-04 DIAGNOSIS — M109 Gout, unspecified: Secondary | ICD-10-CM | POA: Diagnosis not present

## 2022-04-04 DIAGNOSIS — I10 Essential (primary) hypertension: Secondary | ICD-10-CM | POA: Diagnosis not present

## 2022-04-04 DIAGNOSIS — R600 Localized edema: Secondary | ICD-10-CM | POA: Diagnosis not present

## 2022-04-13 MED ORDER — FLUTICASONE-SALMETEROL 250-50 MCG/ACT IN AEPB: 1.0000 | INHALATION_SPRAY | Freq: Two times a day (BID) | RESPIRATORY_TRACT | 11 refills | Status: AC

## 2022-04-13 NOTE — Telephone Encounter (Signed)
Called and discussed restarting wixela with pt's daughter at listed cell phone for him. Needs to rinse mouth and brush teeth/tongue after each use.

## 2022-04-19 ENCOUNTER — Ambulatory Visit: Payer: Medicare HMO | Admitting: Student

## 2022-05-18 DIAGNOSIS — Z8709 Personal history of other diseases of the respiratory system: Secondary | ICD-10-CM | POA: Diagnosis not present

## 2022-05-18 DIAGNOSIS — Z8673 Personal history of transient ischemic attack (TIA), and cerebral infarction without residual deficits: Secondary | ICD-10-CM | POA: Diagnosis not present

## 2022-05-18 DIAGNOSIS — E7800 Pure hypercholesterolemia, unspecified: Secondary | ICD-10-CM | POA: Diagnosis not present

## 2022-05-18 DIAGNOSIS — I10 Essential (primary) hypertension: Secondary | ICD-10-CM | POA: Diagnosis not present

## 2022-05-18 DIAGNOSIS — D472 Monoclonal gammopathy: Secondary | ICD-10-CM | POA: Diagnosis not present

## 2022-05-18 DIAGNOSIS — I6523 Occlusion and stenosis of bilateral carotid arteries: Secondary | ICD-10-CM | POA: Diagnosis not present

## 2022-06-12 ENCOUNTER — Other Ambulatory Visit: Payer: Self-pay | Admitting: *Deleted

## 2022-06-12 DIAGNOSIS — I7143 Infrarenal abdominal aortic aneurysm, without rupture: Secondary | ICD-10-CM

## 2022-06-20 ENCOUNTER — Encounter: Payer: Self-pay | Admitting: Vascular Surgery

## 2022-06-20 ENCOUNTER — Ambulatory Visit (INDEPENDENT_AMBULATORY_CARE_PROVIDER_SITE_OTHER): Payer: HMO | Admitting: Vascular Surgery

## 2022-06-20 ENCOUNTER — Ambulatory Visit (HOSPITAL_COMMUNITY)
Admission: RE | Admit: 2022-06-20 | Discharge: 2022-06-20 | Disposition: A | Discharge status: Home / Self Care | Payer: HMO | Source: Ambulatory Visit | Attending: Vascular Surgery | Admitting: Vascular Surgery

## 2022-06-20 VITALS — BP 124/77 | HR 50 | Temp 98.2°F | Wt 230.0 lb

## 2022-06-20 DIAGNOSIS — I7143 Infrarenal abdominal aortic aneurysm, without rupture: Secondary | ICD-10-CM

## 2022-06-20 NOTE — Progress Notes (Signed)
[facility] [place]  ASSESSMENT / PLAN: 89 y.o. male with:  # infrarenal abdominal aortic aneurysm measuring 43mm.  # very mild bilateral carotid artery stenosis  Recommend the following which can slow the progression of atherosclerosis and reduce the risk of major adverse cardiac / limb events:  Complete cessation from all tobacco products. Blood glucose control with goal A1c < 7%. Blood pressure control with goal blood pressure < 140/90 mmHg. Lipid reduction therapy with goal LDL-C <100 mg/dL (<16 if symptomatic from PAD).  Aspirin 81mg  PO QD.  Atorvastatin 40-80mg  PO QD (or other "high intensity" statin therapy).  I counseled the patient that his carotid artery stenosis will likely never require intervention.  He has a small infrarenal abdominal aortic aneurysm which we will continue to monitor.  Follow-up with me in 1 year with repeat duplex.  CHIEF COMPLAINT: Surveillance of known abdominal aortic aneurysm carotid artery stenosis.  HISTORY OF PRESENT ILLNESS: Raymond Morris is a 89 y.o. male who presents to clinic for surveillance of carotid artery stenosis and abdominal aortic aneurysm.  The patient is doing well from a neurologic standpoint.  He reports no new focal symptoms.  Does have some chronic abdominal pain attributed to an umbilical hernia.  He has a known small infrarenal abdominal aortic aneurysm.  The bulk of the visit was spent discussing the natural history of abdominal aortic aneurysm disease and the rationale for surveillance.  06/20/22: She returns clinic for surveillance of the small abdominal aortic aneurysm.  We reviewed his duplex findings today.  He has grown a 43 mm.  I counseled him this is typical of aneurysm disease, quoting him a average rate of growth of 0.25 cm/year.  Past Medical History:  Diagnosis Date   AAA (abdominal aortic aneurysm) ([facility])    AAA (abdominal aortic aneurysm) (H[facility])    Anxiety    Arthritis    "thumbs; hands"  (10/08/2013)   Asthma    Carotid artery occlusion    Cellulitis of right foot 10/2013   Chronic bronchitis ([facility])    "just about q yr"   Chronic lower back pain    COPD (chronic obstructive pulmonary disease) (HCC)    CVA (cerebral infarction)    per MRA done 2002   Depression    Diverticulosis    Edema    chronic, lower extremities   GERD (gastroesophageal reflux disease)    Gout    11/2013 surgeries   Hiatal hernia    Hypercholesterolemia    Hyperlipidemia    Hypertension    Monoclonal gammopathy    DR Myna Hidalgo, 2007,felt to be benign   Phlebitis    right leg   Pneumonia "9 times"   (10/08/2013)   Stroke [facility]) 2002   "didn't know I'd had one; just told me about it in 2015"   Upper respiratory infection    "I've had lots"   Urinary frequency    elevated PSA , with prostate nodule, Dr [id number]    Past Surgical History:  Procedure Laterality Date   APPENDECTOMY     COLONOSCOPY N/A 08/13/2015   Procedure: COLONOSCOPY;  Surgeon: Jeani Hawking, MD;  Location: [facility];  Service: Endoscopy;  Laterality: N/A;   EXCISIONAL HEMORRHOIDECTOMY     HERNIA REPAIR     UMBILICAL HERNIA REPAIR     VASECTOMY      Family History  Problem Relation Age of Onset   Cancer Mother    Cancer Father    Stroke Sister  Liver disease Sister    Pneumonia Daughter        pnemoccoal, deceased   Cerebral palsy Son     Social History   Socioeconomic History   Marital status: Widowed    Spouse name: Not on file   Number of children: Not on file   Years of education: Not on file   Highest education level: Not on file  Occupational History   Not on file  Tobacco Use   Smoking status: Former    Packs/day: 0.50    Years: 28.00    Additional pack years: 0.00    Total pack years: 14.00    Types: Cigarettes    Quit date: 09/07/1976    Years since quitting: 45.8   Smokeless tobacco: Never  Vaping Use   Vaping Use: Never used  Substance and Sexual Activity   Alcohol use: No     Alcohol/week: 0.0 standard drinks of alcohol   Drug use: No   Sexual activity: Not on file  Other Topics Concern   Not on file  Social History Narrative   Not on file   Social Determinants of Health   Financial Resource Strain: Not on file  Food Insecurity: Not on file  Transportation Needs: Not on file  Physical Activity: Not on file  Stress: Not on file  Social Connections: Not on file  Intimate Partner Violence: Not on file    Allergies  Allergen Reactions   Ceclor [Cefaclor] Other (See Comments)    Causes muscle spasms   Bactrim [Sulfamethoxazole-Trimethoprim]    Spironolactone     Other reaction(s): gynecomastia   Sulfa Antibiotics Rash    Current Outpatient Medications  Medication Sig Dispense Refill   albuterol (VENTOLIN HFA) 108 (90 Base) MCG/ACT inhaler Inhale 2 puffs into the lungs every 6 (six) hours as needed for wheezing or shortness of breath. 8 g 6   aspirin EC 81 MG tablet Take 81 mg by mouth daily.     carvedilol (COREG) 12.5 MG tablet Take 12.5 mg by mouth 2 (two) times daily with a meal.     cetirizine (ZYRTEC) 10 MG chewable tablet Chew 10 mg by mouth daily.     cetirizine (ZYRTEC) 10 MG tablet Take 10 mg by mouth daily.     ciprofloxacin (CIPRO) 500 MG tablet Take 1 tablet (500 mg total) by mouth every 12 (twelve) hours. 20 tablet 0   escitalopram (LEXAPRO) 20 MG tablet Take 20 mg by mouth daily. 1/2 -1 tablet once daily     esomeprazole (NEXIUM) 20 MG capsule Take 20 mg by mouth daily before breakfast.     finasteride (PROSCAR) 5 MG tablet Take 1 tablet (5 mg total) by mouth daily. 30 tablet 0   fluticasone-salmeterol (WIXELA INHUB) 250-50 MCG/ACT AEPB Inhale 1 puff into the lungs in the morning and at bedtime. 1 each 11   furosemide (LASIX) 20 MG tablet      olmesartan (BENICAR) 20 MG tablet Take 20 mg by mouth daily.     omega-3 acid ethyl esters (LOVAZA) 1 G capsule Take 3 g by mouth daily. Take 2 capsules daily     phenazopyridine (PYRIDIUM) 200  MG tablet Take 1 tablet (200 mg total) by mouth 3 (three) times daily. 6 tablet 0   potassium chloride (KLOR-CON) 10 MEQ tablet Take 10 mEq by mouth daily.     rosuvastatin (CRESTOR) 20 MG tablet Take 10 mg by mouth daily.     Tamsulosin HCl (FLOMAX) 0.4 MG CAPS  Take 0.4 mg by mouth daily.     No current facility-administered medications for this visit.    PHYSICAL EXAM There were no vitals filed for this visit.   Constitutional: Well-appearing elderly man in no acute distress Cardiac: regular rate and rhythm.  Respiratory:  unlabored. Abdominal:  soft, tender about midline fascial defect about the umbilicus.  Non-distended.   PERTINENT LABORATORY AND RADIOLOGIC DATA  Most recent CBC    Latest Ref Rng & Units 11/04/2021    2:30 PM 05/28/2020    4:34 PM 01/19/2014   12:13 PM  CBC  WBC 4.0 - 10.5 K/uL 11.7  8.3    Hemoglobin 13.0 - 17.0 g/dL 16.1  09.6  04.5   Hematocrit 39.0 - 52.0 % 38.7  40.2  45.0   Platelets 150 - 400 K/uL 176  192       Most recent CMP    Latest Ref Rng & Units 11/04/2021    2:30 PM 05/28/2020    4:34 PM 01/19/2014   12:13 PM  CMP  Glucose 70 - 99 mg/dL 409  811  914   BUN 8 - 23 mg/dL 16  16  19    Creatinine 0.61 - 1.24 mg/dL 7.82  9.56  2.13   Sodium 135 - 145 mmol/L 138  135  140   Potassium 3.5 - 5.1 mmol/L 3.7  4.1  3.4   Chloride 98 - 111 mmol/L 103  99  100   CO2 22 - 32 mmol/L 24  27    Calcium 8.9 - 10.3 mg/dL 9.3  9.0      Abdominal Aorta: There is evidence of abnormal dilatation of the distal  Abdominal aorta. The largest aortic measurement is 4.3 cm. The largest  aortic diameter has increased compared to prior exam. Previous diameter  measurement was 4.1 cm obtained on  06/17/2021.      1-39% carotid artery stenosis bilaterally  Rande Brunt. Lenell Antu, MD [facility] Phone Number: [phone] 06/20/2022 7:46 AM  Total time spent on preparing this encounter including chart review, data review,  collecting history, examining the patient, coordinating care for this established patient, 40 minutes.  Portions of this report may have been transcribed using voice recognition software.  Every effort has been made to ensure accuracy; however, inadvertent computerized transcription errors may still be present.

## 2022-07-01 ENCOUNTER — Other Ambulatory Visit: Payer: Self-pay

## 2022-07-01 DIAGNOSIS — I7143 Infrarenal abdominal aortic aneurysm, without rupture: Secondary | ICD-10-CM

## 2022-07-13 DIAGNOSIS — N401 Enlarged prostate with lower urinary tract symptoms: Secondary | ICD-10-CM | POA: Diagnosis not present

## 2022-07-13 DIAGNOSIS — R3914 Feeling of incomplete bladder emptying: Secondary | ICD-10-CM | POA: Diagnosis not present

## 2022-07-13 NOTE — Progress Notes (Unsigned)
[facility] [phone]   [phone]  INITIAL CONSULT NOTE  Patient Care Team: Merri Brunette, MD as PCP - General (Internal Medicine) Parke Poisson, MD as PCP - Cardiology (Cardiology) Othella Boyer, MD (Cardiology) Esaw Dace, MD as Attending Physician (Urology) Early, Kristen Loader, MD (Inactive) as Consulting Physician (Vascular Surgery)  Hematological/Oncological History # Monoclonal Gammopathy  05/18/2022: M protein 0.2, Kappa 28.9, Lambda 15.6, Kappa/lambda ratio 1.85 07/14/2022: establish care with Dr. Leonides Schanz   CHIEF COMPLAINTS/PURPOSE OF CONSULTATION:  "Monoclonal Gammopathy  "  HISTORY OF PRESENTING ILLNESS:  Raymond Morris 89 y.o. male with medical history significant for COPD, CVA, GERD, gout, HLD and HTN who presents for evaluation of an elevated M protein.   On review of the previous records Mrs. Figg last had labs drawn on 05/18/2022.  At that time labs showed M protein 0.2, kappa 28.9, lambda 15.6, and a ratio of 1.85.  Due to concern for these findings the patient was referred to hematology for further evaluation and management.  On exam today Raymond Morris is accompanied by his daughter.  He reports has not had any urinary issues.  He has not had any bubbling of the urine, vomiting, or changes in the color.  He has not had any new bone or back pain, though he does have a chronic back pain.  He reports his energy levels are "fair".  He notes he does have some occasional dizziness and does fatigue easily on exertion.  He notes that his gait is not terribly stable.  On further discussion he reports that his mother had cancer "on the neck" and that his father had cancer when he was 7 years old, he is unsure what kind.  His sister did have issues with alcohol abuse.  He has 4 children 2 of whom have passed away.  1 was due to cerebral palsy and the other was due to hepatitis C.  He is a former smoker having quit in the 1978.  He  does not currently drink any alcohol.  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  He otherwise denies any fevers, chills, sweats, nausea vomiting or diarrhea.  A full 10 point ROS is otherwise negative.  MEDICAL HISTORY:  Past Medical History:  Diagnosis Date   AAA (abdominal aortic aneurysm) (HCC)    AAA (abdominal aortic aneurysm) (HCC)    Anxiety    Arthritis    "thumbs; hands" (10/08/2013)   Asthma    Carotid artery occlusion    Cellulitis of right foot 10/2013   Chronic bronchitis (HCC)    "just about q yr"   Chronic lower back pain    COPD (chronic obstructive pulmonary disease) (HCC)    CVA (cerebral infarction)    per MRA done 2002   Depression    Diverticulosis    Edema    chronic, lower extremities   GERD (gastroesophageal reflux disease)    Gout    11/2013 surgeries   Hiatal hernia    Hypercholesterolemia    Hyperlipidemia    Hypertension    Monoclonal gammopathy    DR Myna Hidalgo, 2007,felt to be benign   Phlebitis    right leg   Pneumonia "9 times"   (10/08/2013)   Stroke [facility]) 2002   "didn't know I'd had one; just told me about it in 2015"   Upper respiratory infection    "I've had lots"   Urinary frequency    elevated PSA ,  with prostate nodule, Dr [id number]    SURGICAL HISTORY: Past Surgical History:  Procedure Laterality Date   APPENDECTOMY     COLONOSCOPY N/A 08/13/2015   Procedure: COLONOSCOPY;  Surgeon: Jeani Hawking, MD;  Location: [facility];  Service: Endoscopy;  Laterality: N/A;   EXCISIONAL HEMORRHOIDECTOMY     HERNIA REPAIR     UMBILICAL HERNIA REPAIR     VASECTOMY      SOCIAL HISTORY: Social History   Socioeconomic History   Marital status: Widowed    Spouse name: Not on file   Number of children: Not on file   Years of education: Not on file   Highest education level: Not on file  Occupational History   Not on file  Tobacco Use   Smoking status: Former    Current packs/day: 0.00    Average packs/day: 0.5  packs/day for 28.0 years (14.0 ttl pk-yrs)    Types: Cigarettes    Start date: 09/07/1948    Quit date: 09/07/1976    Years since quitting: 45.8   Smokeless tobacco: Never  Vaping Use   Vaping status: Never Used  Substance and Sexual Activity   Alcohol use: No    Alcohol/week: 0.0 standard drinks of alcohol   Drug use: No   Sexual activity: Not on file  Other Topics Concern   Not on file  Social History Narrative   Not on file   Social Determinants of Health   Financial Resource Strain: Not on file  Food Insecurity: Not on file  Transportation Needs: Not on file  Physical Activity: Not on file  Stress: Not on file  Social Connections: Not on file  Intimate Partner Violence: Not on file    FAMILY HISTORY: Family History  Problem Relation Age of Onset   Cancer Mother    Cancer Father    Stroke Sister    Liver disease Sister    Pneumonia Daughter        pnemoccoal, deceased   Cerebral palsy Son     ALLERGIES:  is allergic to ceclor [cefaclor], bactrim [sulfamethoxazole-trimethoprim], spironolactone, and sulfa antibiotics.  MEDICATIONS:  Current Outpatient Medications  Medication Sig Dispense Refill   albuterol (VENTOLIN HFA) 108 (90 Base) MCG/ACT inhaler Inhale 2 puffs into the lungs every 6 (six) hours as needed for wheezing or shortness of breath. 8 g 6   aspirin EC 81 MG tablet Take 81 mg by mouth daily.     carvedilol (COREG) 12.5 MG tablet Take 12.5 mg by mouth 2 (two) times daily with a meal.     escitalopram (LEXAPRO) 20 MG tablet Take 20 mg by mouth daily. 1/2 -1 tablet once daily     esomeprazole (NEXIUM) 20 MG capsule Take 20 mg by mouth daily before breakfast.     finasteride (PROSCAR) 5 MG tablet Take 1 tablet (5 mg total) by mouth daily. 30 tablet 0   fluticasone-salmeterol (WIXELA INHUB) 250-50 MCG/ACT AEPB Inhale 1 puff into the lungs in the morning and at bedtime. 1 each 11   furosemide (LASIX) 20 MG tablet      olmesartan (BENICAR) 20 MG tablet Take 20  mg by mouth daily.     potassium chloride (KLOR-CON) 10 MEQ tablet Take 10 mEq by mouth daily.     rosuvastatin (CRESTOR) 20 MG tablet Take 10 mg by mouth daily.     Tamsulosin HCl (FLOMAX) 0.4 MG CAPS Take 0.4 mg by mouth daily.     No current facility-administered medications for this visit.  REVIEW OF SYSTEMS:   Constitutional: ( - ) fevers, ( - )  chills , ( - ) night sweats Eyes: ( - ) blurriness of vision, ( - ) double vision, ( - ) watery eyes Ears, nose, mouth, throat, and face: ( - ) mucositis, ( - ) sore throat Respiratory: ( - ) cough, ( - ) dyspnea, ( - ) wheezes Cardiovascular: ( - ) palpitation, ( - ) chest discomfort, ( - ) lower extremity swelling Gastrointestinal:  ( - ) nausea, ( - ) heartburn, ( - ) change in bowel habits Skin: ( - ) abnormal skin rashes Lymphatics: ( - ) new lymphadenopathy, ( - ) easy bruising Neurological: ( - ) numbness, ( - ) tingling, ( - ) new weaknesses Behavioral/Psych: ( - ) mood change, ( - ) new changes  All other systems were reviewed with the patient and are negative.  PHYSICAL EXAMINATION:  Vitals:   07/14/22 0910  BP: 127/65  Pulse: (!) 51  Resp: 16  Temp: 97.6 F (36.4 C)  SpO2: 97%   Filed Weights   07/14/22 0910  Weight: 232 lb 8 oz (105.5 kg)    GENERAL: well appearing elderly Caucasian male in NAD  SKIN: skin color, texture, turgor are normal, no rashes or significant lesions EYES: conjunctiva are pink and non-injected, sclera clear LUNGS: clear to auscultation and percussion with normal breathing effort HEART: regular rate & rhythm and no murmurs and no lower extremity edema Musculoskeletal: no cyanosis of digits and no clubbing  PSYCH: alert & oriented x 3, fluent speech NEURO: no focal motor/sensory deficits  LABORATORY DATA:  I have reviewed the data as listed    Latest Ref Rng & Units 07/14/2022   10:31 AM 11/04/2021    2:30 PM 05/28/2020    4:34 PM  CBC  WBC 4.0 - 10.5 K/uL 6.7  11.7  8.3   Hemoglobin  13.0 - 17.0 g/dL 46.9  62.9  52.8   Hematocrit 39.0 - 52.0 % 42.6  38.7  40.2   Platelets 150 - 400 K/uL 170  176  192        Latest Ref Rng & Units 07/14/2022   10:31 AM 11/04/2021    2:30 PM 05/28/2020    4:34 PM  CMP  Glucose 70 - 99 mg/dL 99  413  244   BUN 8 - 23 mg/dL 19  16  16    Creatinine 0.61 - 1.24 mg/dL 0.10  2.72  5.36   Sodium 135 - 145 mmol/L 138  138  135   Potassium 3.5 - 5.1 mmol/L 4.2  3.7  4.1   Chloride 98 - 111 mmol/L 103  103  99   CO2 22 - 32 mmol/L 29  24  27    Calcium 8.9 - 10.3 mg/dL 64.4  9.3  9.0   Total Protein 6.5 - 8.1 g/dL 6.5     Total Bilirubin 0.3 - 1.2 mg/dL 0.8     Alkaline Phos 38 - 126 U/L 44     AST 15 - 41 U/L 18     ALT 0 - 44 U/L 16        ASSESSMENT & PLAN Raymond Morris 89 y.o. male with medical history significant for COPD, CVA, GERD, gout, HLD and HTN who presents for evaluation of an elevated M protein.   After review of the labs, review of the records, and discussion with the patient the patients findings are most consistent with a monoclonal  gammopathy.   Monoclonal Gammopathies are a group of medical conditions defined by the presence of a monoclonal protein (an M protein) in the blood or urine. Monoclonal gammopathies include monoclonal gammopathy of unknown significance (MGUS), Monoclonal gammopathies of renal or neurological significance,  smoldering multiple myeloma (SMM), multiple myeloma (MM), AL amyloidosis, and Waldenstrom macroglobulinemia. The goal of the initial workup is to determine which monoclonal gammopathy a patient has. The workup consists of evaluating protein in the serum (with serum protein electrophoresis (SPEP) and serum free light chains) , evaluating protein in the urine (UPEP), and evaluation of the skeleton (DG Bone Met Survey) to assure no lytic lesions. Baseline bloodwork includes CMP and CBC. If no CRAB criteria or high risk criteria are noted then the diagnosis is MGUS. MGUS must be followed with bloodwork  periodically to assure it does not convert to multiple myeloma (occurs to approximately 1% of patients per year). If there are CRAB criteria or high risk features (such as elevated serum free light chain ratio (taking into account renal function), a non IgG M protein, or M protein >1.5) then a bone marrow biopsy must be pursued.    # Monoclonal Gammopathy of Undetermined Significance --today will order an SPEP, UPEP, SFLC and beta 2 microglobulin --additionally will collect new baseline CBC, CMP, and LDH --recommend a metastatic bone survey to assess for lytic lesions --will consider the need for a bone marrow biopsy pending the above results --RTC in 6 months or sooner if indicated by the above labs.   Orders Placed This Encounter  Procedures   DG Bone Survey Met    Standing Status:   Future    Standing Expiration Date:   07/14/2023    Order Specific Question:   Reason for Exam (SYMPTOM  OR DIAGNOSIS REQUIRED)    Answer:   MGUS, assess for lytic lesions    Order Specific Question:   Preferred imaging location?    Answer:   [facility]   CBC with Differential (Cancer Center Only)    Standing Status:   Future    Number of Occurrences:   1    Standing Expiration Date:   07/14/2023   CMP (Cancer Center only)    Standing Status:   Future    Number of Occurrences:   1    Standing Expiration Date:   07/14/2023   Lactate dehydrogenase (LDH)    Standing Status:   Future    Number of Occurrences:   1    Standing Expiration Date:   07/14/2023   Multiple Myeloma Panel (SPEP&IFE w/QIG)    Standing Status:   Future    Number of Occurrences:   1    Standing Expiration Date:   07/14/2023   Kappa/lambda light chains    Standing Status:   Future    Number of Occurrences:   1    Standing Expiration Date:   07/14/2023   Beta 2 microglobulin    Standing Status:   Future    Number of Occurrences:   1    Standing Expiration Date:   07/14/2023   24-Hr Ur UPEP/UIFE/Light Chains/TP    Standing  Status:   Future    Standing Expiration Date:   07/14/2023    All questions were answered. The patient knows to call the clinic with any problems, questions or concerns.  A total of more than 60 minutes were spent on this encounter with face-to-face time and non-face-to-face time, including preparing to see the patient, ordering  tests and/or medications, counseling the patient and coordination of care as outlined above.   Ulysees Barns, MD Department of Hematology/Oncology [facility] Phone: [phone] Pager: [phone] Email: [email or web address]  07/16/2022 6:25 PM

## 2022-07-14 ENCOUNTER — Inpatient Hospital Stay: Payer: HMO | Attending: Hematology and Oncology | Admitting: Hematology and Oncology

## 2022-07-14 ENCOUNTER — Inpatient Hospital Stay: Payer: HMO

## 2022-07-14 ENCOUNTER — Other Ambulatory Visit: Payer: Self-pay

## 2022-07-14 VITALS — BP 127/65 | HR 51 | Temp 97.6°F | Resp 16 | Ht 68.0 in | Wt 232.5 lb

## 2022-07-14 DIAGNOSIS — K219 Gastro-esophageal reflux disease without esophagitis: Secondary | ICD-10-CM | POA: Insufficient documentation

## 2022-07-14 DIAGNOSIS — Z79899 Other long term (current) drug therapy: Secondary | ICD-10-CM | POA: Insufficient documentation

## 2022-07-14 DIAGNOSIS — Z87891 Personal history of nicotine dependence: Secondary | ICD-10-CM | POA: Diagnosis not present

## 2022-07-14 DIAGNOSIS — I10 Essential (primary) hypertension: Secondary | ICD-10-CM | POA: Insufficient documentation

## 2022-07-14 DIAGNOSIS — Z8673 Personal history of transient ischemic attack (TIA), and cerebral infarction without residual deficits: Secondary | ICD-10-CM | POA: Diagnosis not present

## 2022-07-14 DIAGNOSIS — Z809 Family history of malignant neoplasm, unspecified: Secondary | ICD-10-CM | POA: Diagnosis not present

## 2022-07-14 DIAGNOSIS — G8929 Other chronic pain: Secondary | ICD-10-CM | POA: Diagnosis not present

## 2022-07-14 DIAGNOSIS — D472 Monoclonal gammopathy: Secondary | ICD-10-CM

## 2022-07-14 DIAGNOSIS — J449 Chronic obstructive pulmonary disease, unspecified: Secondary | ICD-10-CM | POA: Diagnosis not present

## 2022-07-14 LAB — CMP (CANCER CENTER ONLY)
ALT: 16 U/L (ref 0–44)
AST: 18 U/L (ref 15–41)
Albumin: 4 g/dL (ref 3.5–5.0)
Alkaline Phosphatase: 44 U/L (ref 38–126)
Anion gap: 6 (ref 5–15)
BUN: 19 mg/dL (ref 8–23)
CO2: 29 mmol/L (ref 22–32)
Calcium: 10 mg/dL (ref 8.9–10.3)
Chloride: 103 mmol/L (ref 98–111)
Creatinine: 0.96 mg/dL (ref 0.61–1.24)
GFR, Estimated: >60 mL/min (ref >60)
Glucose, Bld: 99 mg/dL (ref 70–99)
Potassium: 4.2 mmol/L (ref 3.5–5.1)
Sodium: 138 mmol/L (ref 135–145)
Total Bilirubin: 0.8 mg/dL (ref 0.3–1.2)
Total Protein: 6.5 g/dL (ref 6.5–8.1)

## 2022-07-14 LAB — CBC WITH DIFFERENTIAL (CANCER CENTER ONLY)
Abs Immature Granulocytes: 0.02 10*3/uL (ref 0.00–0.07)
Basophils Absolute: 0.1 10*3/uL (ref 0.0–0.1)
Basophils Relative: 1 %
Eosinophils Absolute: 0.2 10*3/uL (ref 0.0–0.5)
Eosinophils Relative: 2 %
HCT: 42.6 % (ref 39.0–52.0)
Hemoglobin: 14.5 g/dL (ref 13.0–17.0)
Immature Granulocytes: 0 %
Lymphocytes Relative: 17 %
Lymphs Abs: 1.1 10*3/uL (ref 0.7–4.0)
MCH: 32.3 pg (ref 26.0–34.0)
MCHC: 34 g/dL (ref 30.0–36.0)
MCV: 94.9 fL (ref 80.0–100.0)
Monocytes Absolute: 0.7 10*3/uL (ref 0.1–1.0)
Monocytes Relative: 10 %
Neutro Abs: 4.7 10*3/uL (ref 1.7–7.7)
Neutrophils Relative %: 70 %
Platelet Count: 170 10*3/uL (ref 150–400)
RBC: 4.49 MIL/uL (ref 4.22–5.81)
RDW: 13.5 % (ref 11.5–15.5)
WBC Count: 6.7 10*3/uL (ref 4.0–10.5)
nRBC: 0 % (ref 0.0–0.2)

## 2022-07-14 LAB — LACTATE DEHYDROGENASE: LDH: 165 U/L (ref 98–192)

## 2022-07-16 LAB — BETA 2 MICROGLOBULIN, SERUM: Beta-2 Microglobulin: 1.6 mg/L (ref 0.6–2.4)

## 2022-07-17 LAB — KAPPA/LAMBDA LIGHT CHAINS
Kappa free light chain: 34.2 mg/L — ABNORMAL HIGH (ref 3.3–19.4)
Kappa, lambda light chain ratio: 2.22 — ABNORMAL HIGH (ref 0.26–1.65)
Lambda free light chains: 15.4 mg/L (ref 5.7–26.3)

## 2022-07-18 DIAGNOSIS — D472 Monoclonal gammopathy: Secondary | ICD-10-CM | POA: Diagnosis not present

## 2022-07-19 LAB — MULTIPLE MYELOMA PANEL, SERUM
Albumin SerPl Elph-Mcnc: 3.4 g/dL (ref 2.9–4.4)
Albumin/Glob SerPl: 1.4 (ref 0.7–1.7)
Alpha 1: 0.2 g/dL (ref 0.0–0.4)
Alpha2 Glob SerPl Elph-Mcnc: 0.7 g/dL (ref 0.4–1.0)
B-Globulin SerPl Elph-Mcnc: 0.9 g/dL (ref 0.7–1.3)
Gamma Glob SerPl Elph-Mcnc: 0.8 g/dL (ref 0.4–1.8)
Globulin, Total: 2.6 g/dL (ref 2.2–3.9)
IgA: 146 mg/dL (ref 61–437)
IgG (Immunoglobin G), Serum: 893 mg/dL (ref 603–1613)
IgM (Immunoglobulin M), Srm: 21 mg/dL (ref 15–143)
M Protein SerPl Elph-Mcnc: 0.4 g/dL — ABNORMAL HIGH
Total Protein ELP: 6 g/dL (ref 6.0–8.5)

## 2022-07-21 ENCOUNTER — Ambulatory Visit (HOSPITAL_COMMUNITY)
Admission: RE | Admit: 2022-07-21 | Discharge: 2022-07-21 | Disposition: A | Discharge status: Home / Self Care | Payer: HMO | Source: Ambulatory Visit | Attending: Hematology and Oncology | Admitting: Hematology and Oncology

## 2022-07-21 DIAGNOSIS — M47819 Spondylosis without myelopathy or radiculopathy, site unspecified: Secondary | ICD-10-CM | POA: Diagnosis not present

## 2022-07-21 DIAGNOSIS — M170 Bilateral primary osteoarthritis of knee: Secondary | ICD-10-CM | POA: Diagnosis not present

## 2022-07-21 DIAGNOSIS — D472 Monoclonal gammopathy: Secondary | ICD-10-CM | POA: Diagnosis not present

## 2022-07-21 LAB — UPEP/UIFE/LIGHT CHAINS/TP, 24-HR UR
% BETA, Urine: 19.1 %
ALPHA 1 URINE: 1.7 %
Albumin, U: 49.8 %
Alpha 2, Urine: 6.8 %
Free Kappa Lt Chains,Ur: 12.84 mg/L (ref 1.17–86.46)
Free Kappa/Lambda Ratio: 8.18 (ref 1.83–14.26)
Free Lambda Lt Chains,Ur: 1.57 mg/L (ref 0.27–15.21)
GAMMA GLOBULIN URINE: 22.6 %
Total Protein, Urine-Ur/day: 121 mg/(24.h) (ref 30–150)
Total Protein, Urine: 6.3 mg/dL
Total Volume: 1925

## 2022-07-31 ENCOUNTER — Telehealth: Payer: Self-pay | Admitting: *Deleted

## 2022-07-31 ENCOUNTER — Other Ambulatory Visit: Payer: Self-pay | Admitting: Hematology and Oncology

## 2022-07-31 DIAGNOSIS — D472 Monoclonal gammopathy: Secondary | ICD-10-CM

## 2022-07-31 NOTE — Telephone Encounter (Signed)
Received vm message from pt's daughter inquiring about his most recent lab results from 07/14/22.  Please advise

## 2022-08-02 ENCOUNTER — Telehealth: Payer: Self-pay | Admitting: *Deleted

## 2022-08-02 NOTE — Telephone Encounter (Signed)
TCT patient's daughter, Raymond Morris Please let Raymond Morris know via his daughter that his protein levels were high enough to require a bone marrow biopsy. A request has been placed to have this scheduled,  Pending the results of the bone marrow biopsy we may schedule him for an earlier return clinic visit . Spoke with daughter. Advised of the above.  She states Dr. Leonides Schanz spoke with her yesterday as well. She states her dad wants to think about it over the weekend. She states she will call once he decides what he would like to do.  No other questions or concerns.

## 2022-09-19 DIAGNOSIS — I10 Essential (primary) hypertension: Secondary | ICD-10-CM | POA: Diagnosis not present

## 2022-09-19 DIAGNOSIS — Z8709 Personal history of other diseases of the respiratory system: Secondary | ICD-10-CM | POA: Diagnosis not present

## 2022-09-19 DIAGNOSIS — I6523 Occlusion and stenosis of bilateral carotid arteries: Secondary | ICD-10-CM | POA: Diagnosis not present

## 2022-09-19 DIAGNOSIS — M5136 Other intervertebral disc degeneration, lumbar region: Secondary | ICD-10-CM | POA: Diagnosis not present

## 2022-09-19 DIAGNOSIS — Z23 Encounter for immunization: Secondary | ICD-10-CM | POA: Diagnosis not present

## 2022-09-19 DIAGNOSIS — E7800 Pure hypercholesterolemia, unspecified: Secondary | ICD-10-CM | POA: Diagnosis not present

## 2022-09-19 DIAGNOSIS — R7303 Prediabetes: Secondary | ICD-10-CM | POA: Diagnosis not present

## 2022-09-19 DIAGNOSIS — Z8673 Personal history of transient ischemic attack (TIA), and cerebral infarction without residual deficits: Secondary | ICD-10-CM | POA: Diagnosis not present

## 2022-09-23 DIAGNOSIS — H9193 Unspecified hearing loss, bilateral: Secondary | ICD-10-CM | POA: Diagnosis not present

## 2022-09-23 DIAGNOSIS — K219 Gastro-esophageal reflux disease without esophagitis: Secondary | ICD-10-CM | POA: Diagnosis not present

## 2022-09-23 DIAGNOSIS — D472 Monoclonal gammopathy: Secondary | ICD-10-CM | POA: Diagnosis not present

## 2022-09-23 DIAGNOSIS — E7800 Pure hypercholesterolemia, unspecified: Secondary | ICD-10-CM | POA: Diagnosis not present

## 2022-09-23 DIAGNOSIS — M5136 Other intervertebral disc degeneration, lumbar region: Secondary | ICD-10-CM | POA: Diagnosis not present

## 2022-09-23 DIAGNOSIS — R7303 Prediabetes: Secondary | ICD-10-CM | POA: Diagnosis not present

## 2022-09-23 DIAGNOSIS — I6523 Occlusion and stenosis of bilateral carotid arteries: Secondary | ICD-10-CM | POA: Diagnosis not present

## 2022-09-23 DIAGNOSIS — Z6834 Body mass index (BMI) 34.0-34.9, adult: Secondary | ICD-10-CM | POA: Diagnosis not present

## 2022-09-23 DIAGNOSIS — K5790 Diverticulosis of intestine, part unspecified, without perforation or abscess without bleeding: Secondary | ICD-10-CM | POA: Diagnosis not present

## 2022-09-23 DIAGNOSIS — I10 Essential (primary) hypertension: Secondary | ICD-10-CM | POA: Diagnosis not present

## 2022-09-23 DIAGNOSIS — G8929 Other chronic pain: Secondary | ICD-10-CM | POA: Diagnosis not present

## 2022-09-23 DIAGNOSIS — R350 Frequency of micturition: Secondary | ICD-10-CM | POA: Diagnosis not present

## 2022-09-23 DIAGNOSIS — Z8673 Personal history of transient ischemic attack (TIA), and cerebral infarction without residual deficits: Secondary | ICD-10-CM | POA: Diagnosis not present

## 2022-09-23 DIAGNOSIS — J4489 Other specified chronic obstructive pulmonary disease: Secondary | ICD-10-CM | POA: Diagnosis not present

## 2022-09-23 DIAGNOSIS — M51369 Other intervertebral disc degeneration, lumbar region without mention of lumbar back pain or lower extremity pain: Secondary | ICD-10-CM | POA: Diagnosis not present

## 2022-09-23 DIAGNOSIS — Z7951 Long term (current) use of inhaled steroids: Secondary | ICD-10-CM | POA: Diagnosis not present

## 2022-09-23 DIAGNOSIS — N401 Enlarged prostate with lower urinary tract symptoms: Secondary | ICD-10-CM | POA: Diagnosis not present

## 2022-09-23 DIAGNOSIS — F419 Anxiety disorder, unspecified: Secondary | ICD-10-CM | POA: Diagnosis not present

## 2022-09-23 DIAGNOSIS — Z7982 Long term (current) use of aspirin: Secondary | ICD-10-CM | POA: Diagnosis not present

## 2022-09-23 DIAGNOSIS — I714 Abdominal aortic aneurysm, without rupture, unspecified: Secondary | ICD-10-CM | POA: Diagnosis not present

## 2022-09-23 DIAGNOSIS — Z9181 History of falling: Secondary | ICD-10-CM | POA: Diagnosis not present

## 2022-09-23 DIAGNOSIS — M109 Gout, unspecified: Secondary | ICD-10-CM | POA: Diagnosis not present

## 2022-09-23 DIAGNOSIS — F32A Depression, unspecified: Secondary | ICD-10-CM | POA: Diagnosis not present

## 2022-09-23 DIAGNOSIS — Z9089 Acquired absence of other organs: Secondary | ICD-10-CM | POA: Diagnosis not present

## 2022-10-05 DIAGNOSIS — G8929 Other chronic pain: Secondary | ICD-10-CM | POA: Diagnosis not present

## 2022-10-05 DIAGNOSIS — I10 Essential (primary) hypertension: Secondary | ICD-10-CM | POA: Diagnosis not present

## 2022-10-05 DIAGNOSIS — J4489 Other specified chronic obstructive pulmonary disease: Secondary | ICD-10-CM | POA: Diagnosis not present

## 2022-10-14 ENCOUNTER — Encounter: Payer: Self-pay | Admitting: *Deleted

## 2022-10-14 ENCOUNTER — Ambulatory Visit
Admission: EM | Admit: 2022-10-14 | Discharge: 2022-10-14 | Disposition: A | Discharge status: Home / Self Care | Payer: HMO

## 2022-10-14 ENCOUNTER — Other Ambulatory Visit: Payer: Self-pay

## 2022-10-14 DIAGNOSIS — J441 Chronic obstructive pulmonary disease with (acute) exacerbation: Secondary | ICD-10-CM

## 2022-10-14 MED ORDER — AZITHROMYCIN 250 MG PO TABS: 250.0000 mg | ORAL_TABLET | Freq: Every day | ORAL | 0 refills | Status: DC

## 2022-10-14 MED ORDER — PREDNISONE 20 MG PO TABS: 40.0000 mg | ORAL_TABLET | Freq: Every day | ORAL | 0 refills | Status: AC

## 2022-10-14 NOTE — ED Triage Notes (Addendum)
Pt reports cough and chest congestion x 3 days. Sore throat also, but states that is improving. NAD

## 2022-12-21 DIAGNOSIS — R7303 Prediabetes: Secondary | ICD-10-CM | POA: Diagnosis not present

## 2022-12-21 DIAGNOSIS — J45909 Unspecified asthma, uncomplicated: Secondary | ICD-10-CM | POA: Diagnosis not present

## 2022-12-21 DIAGNOSIS — I10 Essential (primary) hypertension: Secondary | ICD-10-CM | POA: Diagnosis not present

## 2022-12-21 DIAGNOSIS — Z8673 Personal history of transient ischemic attack (TIA), and cerebral infarction without residual deficits: Secondary | ICD-10-CM | POA: Diagnosis not present

## 2022-12-21 DIAGNOSIS — L570 Actinic keratosis: Secondary | ICD-10-CM | POA: Diagnosis not present

## 2022-12-21 DIAGNOSIS — E7800 Pure hypercholesterolemia, unspecified: Secondary | ICD-10-CM | POA: Diagnosis not present

## 2023-01-12 ENCOUNTER — Inpatient Hospital Stay: Payer: HMO | Attending: Hematology and Oncology

## 2023-01-12 ENCOUNTER — Other Ambulatory Visit: Payer: Self-pay | Admitting: Hematology and Oncology

## 2023-01-12 DIAGNOSIS — D472 Monoclonal gammopathy: Secondary | ICD-10-CM

## 2023-01-12 DIAGNOSIS — Z79899 Other long term (current) drug therapy: Secondary | ICD-10-CM | POA: Insufficient documentation

## 2023-01-12 LAB — CBC WITH DIFFERENTIAL (CANCER CENTER ONLY)
Abs Immature Granulocytes: 0.01 10*3/uL (ref 0.00–0.07)
Basophils Absolute: 0.1 10*3/uL (ref 0.0–0.1)
Basophils Relative: 1 %
Eosinophils Absolute: 0.2 10*3/uL (ref 0.0–0.5)
Eosinophils Relative: 3 %
HCT: 43.4 % (ref 39.0–52.0)
Hemoglobin: 14.5 g/dL (ref 13.0–17.0)
Immature Granulocytes: 0 %
Lymphocytes Relative: 20 %
Lymphs Abs: 1.2 10*3/uL (ref 0.7–4.0)
MCH: 31.9 pg (ref 26.0–34.0)
MCHC: 33.4 g/dL (ref 30.0–36.0)
MCV: 95.4 fL (ref 80.0–100.0)
Monocytes Absolute: 0.6 10*3/uL (ref 0.1–1.0)
Monocytes Relative: 10 %
Neutro Abs: 3.9 10*3/uL (ref 1.7–7.7)
Neutrophils Relative %: 66 %
Platelet Count: 196 10*3/uL (ref 150–400)
RBC: 4.55 MIL/uL (ref 4.22–5.81)
RDW: 13.1 % (ref 11.5–15.5)
WBC Count: 5.9 10*3/uL (ref 4.0–10.5)
nRBC: 0 % (ref 0.0–0.2)

## 2023-01-12 LAB — CMP (CANCER CENTER ONLY)
ALT: 14 U/L (ref 0–44)
AST: 18 U/L (ref 15–41)
Albumin: 3.8 g/dL (ref 3.5–5.0)
Alkaline Phosphatase: 48 U/L (ref 38–126)
Anion gap: 5 (ref 5–15)
BUN: 12 mg/dL (ref 8–23)
CO2: 29 mmol/L (ref 22–32)
Calcium: 9.5 mg/dL (ref 8.9–10.3)
Chloride: 104 mmol/L (ref 98–111)
Creatinine: 0.9 mg/dL (ref 0.61–1.24)
GFR, Estimated: >60 mL/min (ref >60)
Glucose, Bld: 110 mg/dL — ABNORMAL HIGH (ref 70–99)
Potassium: 3.8 mmol/L (ref 3.5–5.1)
Sodium: 138 mmol/L (ref 135–145)
Total Bilirubin: 0.8 mg/dL (ref 0.0–1.2)
Total Protein: 6.2 g/dL — ABNORMAL LOW (ref 6.5–8.1)

## 2023-01-12 LAB — LACTATE DEHYDROGENASE: LDH: 150 U/L (ref 98–192)

## 2023-01-15 LAB — KAPPA/LAMBDA LIGHT CHAINS
Kappa free light chain: 29 mg/L — ABNORMAL HIGH (ref 3.3–19.4)
Kappa, lambda light chain ratio: 2.1 — ABNORMAL HIGH (ref 0.26–1.65)
Lambda free light chains: 13.8 mg/L (ref 5.7–26.3)

## 2023-01-18 DIAGNOSIS — D472 Monoclonal gammopathy: Secondary | ICD-10-CM | POA: Insufficient documentation

## 2023-01-18 NOTE — Assessment & Plan Note (Addendum)
Established care with Dr. Leonides Schanz on 07/14/2022. Labs from 07/14/2022 showed kappa free light chains were elevated at 34.2, lambda free light chains were normal at 15.4, and kappa/lambda free light chain ratio was elevated at 2.22.  Urine protein and free light chains were normal.  CBC and CMP were normal.  His M protein spike with elevated at 0.4.  A bone survey was completed on 07/21/2022.  This showed no evidence of lytic or destructive bone lesions.  The patient does have diffuse degenerative changes throughout the spine. -01/19/2023 - M protein spike improved at 0.3 today.  -continue to monitor labs with follow up every 6 months.

## 2023-01-18 NOTE — Progress Notes (Unsigned)
Patient Care Team: Merri Brunette, MD as PCP - General (Internal Medicine) Parke Poisson, MD as PCP - Cardiology (Cardiology) Othella Boyer, MD (Cardiology) Esaw Dace, MD as Attending Physician (Urology) Early, Kristen Loader, MD (Inactive) as Consulting Physician (Vascular Surgery)  Clinic Day:  01/19/2023  Referring physician: Merri Brunette, MD  ASSESSMENT & PLAN:   Assessment & Plan: MGUS (monoclonal gammopathy of unknown significance) Established care with Dr. Leonides Schanz on 07/14/2022. Labs from 07/14/2022 showed kappa free light chains were elevated at 34.2, lambda free light chains were normal at 15.4, and kappa/lambda free light chain ratio was elevated at 2.22.  Urine protein and free light chains were normal.  CBC and CMP were normal.  His M protein spike with elevated at 0.4.  A bone survey was completed on 07/21/2022.  This showed no evidence of lytic or destructive bone lesions.  The patient does have diffuse degenerative changes throughout the spine.    Plan: Labs reviewed from 01/12/2023 -CBC showing WBC 5.9; Hgb 14.5; Hct 43.4; MCV 95.4 ;plt 196; Anc 3.9 -CMP - K 3.8; glucose 110; BUN 12; Creatinine 0.90; eGFR > 60; Ca 9.5; LFTs normal.   -LDH = 150 kappa free light chains 29.0; lambda free light chains 13.8; kappa/lambda free light chain ratio 2.10 -Multiple myeloma panel -shows M protein lower than initial work up at 0.3 today  -6 month labs with follow up with Dr. Leonides Schanz  The patient understands the plans discussed today and is in agreement with them.  He knows to contact our office if he develops concerns prior to his next appointment.  I provided 20 minutes of face-to-face time during this encounter and > 50% was spent counseling as documented under my assessment and plan.    Carlean Jews, NP  [facility] - A DEPT OF [facility] [address] [place] [address] Dept: [phone] Dept  Fax: [phone]   No orders of the defined types were placed in this encounter.     CHIEF COMPLAINT:  CC: MGUS  Current Treatment:  surveillance  INTERVAL HISTORY:  Levontae is here today for repeat clinical assessment.  Last seen by Dr. Leonides Schanz on 07/14/2022.  This was initial workup.  Labs from 07/14/2022 showed kappa free light chains were elevated at 34.2, lambda free light chains were normal at 15.4, and kappa/lambda free light chain ratio was elevated at 2.22.  Urine protein and free light chains were normal.  CBC and CMP were normal.  His M protein spike with elevated at 0.4.  A bone survey was completed on 07/21/2022.  This showed no evidence of lytic or destructive bone lesions.  The patient does have diffuse degenerative changes throughout the spine.  He would like to avoid bone marrow biopsy if possible. Today, myeloma panel indicates a reduction of M. Protein to 0.3. He denies chest pain, chest pressure, or shortness of breath. He denies headaches or visual disturbances. He denies abdominal pain, nausea, vomiting, or changes in bowel or bladder habits.  He denies fevers or chills. He denies pain. His appetite is good. His weight has increased 5 pounds over last 6 .  I have reviewed the past medical history, past surgical history, social history and family history with the patient and they are unchanged from previous note.  ALLERGIES:  is allergic to ceclor [cefaclor], bactrim [sulfamethoxazole-trimethoprim], spironolactone, and sulfa antibiotics.  MEDICATIONS:  Current Outpatient Medications  Medication Sig  Dispense Refill   albuterol (VENTOLIN HFA) 108 (90 Base) MCG/ACT inhaler Inhale 2 puffs into the lungs every 6 (six) hours as needed for wheezing or shortness of breath. 8 g 6   AREXVY 120 MCG/0.5ML injection      aspirin EC 81 MG tablet Take 81 mg by mouth daily.     azithromycin (ZITHROMAX) 250 MG tablet Take 1 tablet (250 mg total) by mouth daily. Take first 2 tablets together,  then 1 every day until finished. 6 tablet 0   carvedilol (COREG) 12.5 MG tablet Take 12.5 mg by mouth 2 (two) times daily with a meal.     escitalopram (LEXAPRO) 20 MG tablet Take 20 mg by mouth daily. 1/2 -1 tablet once daily     esomeprazole (NEXIUM) 20 MG capsule Take 20 mg by mouth daily before breakfast.     finasteride (PROSCAR) 5 MG tablet Take 1 tablet (5 mg total) by mouth daily. 30 tablet 0   fluticasone-salmeterol (WIXELA INHUB) 250-50 MCG/ACT AEPB Inhale 1 puff into the lungs in the morning and at bedtime. 1 each 11   furosemide (LASIX) 20 MG tablet      olmesartan (BENICAR) 20 MG tablet Take 20 mg by mouth daily.     potassium chloride (KLOR-CON) 10 MEQ tablet Take 10 mEq by mouth daily.     rosuvastatin (CRESTOR) 20 MG tablet Take 10 mg by mouth daily.     Tamsulosin HCl (FLOMAX) 0.4 MG CAPS Take 0.4 mg by mouth daily.     No current facility-administered medications for this visit.    REVIEW OF SYSTEMS:   Constitutional: Denies fevers, chills or abnormal weight loss Eyes: Denies blurriness of vision Ears, nose, mouth, throat, and face: Denies mucositis or sore throat Respiratory: Denies cough, dyspnea or wheezes Cardiovascular: Denies palpitation, chest discomfort or lower extremity swelling Gastrointestinal:  Denies nausea, heartburn or change in bowel habits Skin: Denies abnormal skin rashes Lymphatics: Denies new lymphadenopathy or easy bruising Neurological:Denies numbness, tingling or new weaknesses Behavioral/Psych: Mood is stable, no new changes  All other systems were reviewed with the patient and are negative.   VITALS:   Today's Vitals   01/19/23 1104  BP: (!) 144/66  Pulse: (!) 56  Resp: 18  Temp: 98.3 F (36.8 C)  TempSrc: Temporal  SpO2: 98%  Weight: 237 lb 8 oz (107.7 kg)   Body mass index is 36.11 kg/m.    Wt Readings from Last 3 Encounters:  01/19/23 237 lb 8 oz (107.7 kg)  07/14/22 232 lb 8 oz (105.5 kg)  06/20/22 230 lb (104.3 kg)     Body mass index is 36.11 kg/m.  Performance status (ECOG): 1 - Symptomatic but completely ambulatory  PHYSICAL EXAM:   GENERAL:alert, no distress and comfortable SKIN: skin color, texture, turgor are normal, no rashes or significant lesions EYES: normal, Conjunctiva are pink and non-injected, sclera clear OROPHARYNX:no exudate, no erythema and lips, buccal mucosa, and tongue normal  NECK: supple, thyroid normal size, non-tender, without nodularity LYMPH:  no palpable lymphadenopathy in the cervical, axillary or inguinal LUNGS: clear to auscultation and percussion with normal breathing effort HEART: regular rate & rhythm and no murmurs and no lower extremity edema ABDOMEN:abdomen soft, non-tender and normal bowel sounds Musculoskeletal:no cyanosis of digits and no clubbing  NEURO: alert & oriented x 3 with fluent speech, no focal motor/sensory deficits  LABORATORY DATA:  I have reviewed the data as listed    Component Value Date/Time   NA 138 01/12/2023 1016  K 3.8 01/12/2023 1016   CL 104 01/12/2023 1016   CO2 29 01/12/2023 1016   GLUCOSE 110 (H) 01/12/2023 1016   BUN 12 01/12/2023 1016   CREATININE 0.90 01/12/2023 1016   CALCIUM 9.5 01/12/2023 1016   PROT 6.2 (L) 01/12/2023 1016   ALBUMIN 3.8 01/12/2023 1016   AST 18 01/12/2023 1016   ALT 14 01/12/2023 1016   ALKPHOS 48 01/12/2023 1016   BILITOT 0.8 01/12/2023 1016   GFRNONAA >60 01/12/2023 1016   GFRAA 73 (L) 10/10/2013 0500    Lab Results  Component Value Date   WBC 5.9 01/12/2023   NEUTROABS 3.9 01/12/2023   HGB 14.5 01/12/2023   HCT 43.4 01/12/2023   MCV 95.4 01/12/2023   PLT 196 01/12/2023

## 2023-01-19 ENCOUNTER — Inpatient Hospital Stay (HOSPITAL_BASED_OUTPATIENT_CLINIC_OR_DEPARTMENT_OTHER): Payer: HMO | Admitting: Nurse Practitioner

## 2023-01-19 VITALS — BP 144/66 | HR 56 | Temp 98.3°F | Resp 18 | Wt 237.5 lb

## 2023-01-19 DIAGNOSIS — D472 Monoclonal gammopathy: Secondary | ICD-10-CM | POA: Diagnosis not present

## 2023-01-19 LAB — MULTIPLE MYELOMA PANEL, SERUM
Albumin SerPl Elph-Mcnc: 3.6 g/dL (ref 2.9–4.4)
Albumin/Glob SerPl: 1.6 (ref 0.7–1.7)
Alpha 1: 0.2 g/dL (ref 0.0–0.4)
Alpha2 Glob SerPl Elph-Mcnc: 0.7 g/dL (ref 0.4–1.0)
B-Globulin SerPl Elph-Mcnc: 0.8 g/dL (ref 0.7–1.3)
Gamma Glob SerPl Elph-Mcnc: 0.7 g/dL (ref 0.4–1.8)
Globulin, Total: 2.3 g/dL (ref 2.2–3.9)
IgA: 139 mg/dL (ref 61–437)
IgG (Immunoglobin G), Serum: 811 mg/dL (ref 603–1613)
IgM (Immunoglobulin M), Srm: 19 mg/dL (ref 15–143)
M Protein SerPl Elph-Mcnc: 0.3 g/dL — ABNORMAL HIGH
Total Protein ELP: 5.9 g/dL — ABNORMAL LOW (ref 6.0–8.5)

## 2023-01-23 ENCOUNTER — Encounter: Payer: Self-pay | Admitting: Nurse Practitioner

## 2023-03-16 DIAGNOSIS — R3914 Feeling of incomplete bladder emptying: Secondary | ICD-10-CM | POA: Diagnosis not present

## 2023-03-16 DIAGNOSIS — N401 Enlarged prostate with lower urinary tract symptoms: Secondary | ICD-10-CM | POA: Diagnosis not present

## 2023-03-16 DIAGNOSIS — R3912 Poor urinary stream: Secondary | ICD-10-CM | POA: Diagnosis not present

## 2023-04-06 DIAGNOSIS — E7800 Pure hypercholesterolemia, unspecified: Secondary | ICD-10-CM | POA: Diagnosis not present

## 2023-04-06 DIAGNOSIS — I10 Essential (primary) hypertension: Secondary | ICD-10-CM | POA: Diagnosis not present

## 2023-04-06 DIAGNOSIS — R7303 Prediabetes: Secondary | ICD-10-CM | POA: Diagnosis not present

## 2023-04-13 ENCOUNTER — Telehealth: Payer: Self-pay | Admitting: Medical Oncology

## 2023-04-13 DIAGNOSIS — Z8673 Personal history of transient ischemic attack (TIA), and cerebral infarction without residual deficits: Secondary | ICD-10-CM | POA: Diagnosis not present

## 2023-04-13 DIAGNOSIS — Z0000 Encounter for general adult medical examination without abnormal findings: Secondary | ICD-10-CM | POA: Diagnosis not present

## 2023-04-13 DIAGNOSIS — I10 Essential (primary) hypertension: Secondary | ICD-10-CM | POA: Diagnosis not present

## 2023-04-13 DIAGNOSIS — I714 Abdominal aortic aneurysm, without rupture, unspecified: Secondary | ICD-10-CM | POA: Diagnosis not present

## 2023-04-13 DIAGNOSIS — J45909 Unspecified asthma, uncomplicated: Secondary | ICD-10-CM | POA: Diagnosis not present

## 2023-04-13 DIAGNOSIS — Z1212 Encounter for screening for malignant neoplasm of rectum: Secondary | ICD-10-CM | POA: Diagnosis not present

## 2023-04-13 DIAGNOSIS — R5383 Other fatigue: Secondary | ICD-10-CM | POA: Diagnosis not present

## 2023-04-13 DIAGNOSIS — I6529 Occlusion and stenosis of unspecified carotid artery: Secondary | ICD-10-CM | POA: Diagnosis not present

## 2023-04-13 DIAGNOSIS — I2510 Atherosclerotic heart disease of native coronary artery without angina pectoris: Secondary | ICD-10-CM | POA: Diagnosis not present

## 2023-04-13 DIAGNOSIS — R600 Localized edema: Secondary | ICD-10-CM | POA: Diagnosis not present

## 2023-04-13 DIAGNOSIS — K5909 Other constipation: Secondary | ICD-10-CM | POA: Diagnosis not present

## 2023-04-13 DIAGNOSIS — I440 Atrioventricular block, first degree: Secondary | ICD-10-CM | POA: Diagnosis not present

## 2023-04-13 DIAGNOSIS — R195 Other fecal abnormalities: Secondary | ICD-10-CM | POA: Diagnosis not present

## 2023-04-13 NOTE — Telephone Encounter (Signed)
 Asking for lab results from January.  He said he never heard back about his results. Message sent to [facility]

## 2023-05-09 DIAGNOSIS — L570 Actinic keratosis: Secondary | ICD-10-CM | POA: Diagnosis not present

## 2023-05-09 DIAGNOSIS — L820 Inflamed seborrheic keratosis: Secondary | ICD-10-CM | POA: Diagnosis not present

## 2023-05-09 DIAGNOSIS — X32XXXA Exposure to sunlight, initial encounter: Secondary | ICD-10-CM | POA: Diagnosis not present

## 2023-05-09 DIAGNOSIS — L821 Other seborrheic keratosis: Secondary | ICD-10-CM | POA: Diagnosis not present

## 2023-05-23 DIAGNOSIS — R195 Other fecal abnormalities: Secondary | ICD-10-CM | POA: Diagnosis not present

## 2023-05-23 DIAGNOSIS — L578 Other skin changes due to chronic exposure to nonionizing radiation: Secondary | ICD-10-CM | POA: Diagnosis not present

## 2023-05-30 ENCOUNTER — Other Ambulatory Visit: Payer: Self-pay | Admitting: Vascular Surgery

## 2023-05-30 DIAGNOSIS — I7143 Infrarenal abdominal aortic aneurysm, without rupture: Secondary | ICD-10-CM

## 2023-05-30 DIAGNOSIS — I714 Abdominal aortic aneurysm, without rupture, unspecified: Secondary | ICD-10-CM

## 2023-06-18 NOTE — Progress Notes (Signed)
 [facility] [place]  ASSESSMENT / PLAN: 89 y.o. male with:  # infrarenal abdominal aortic aneurysm measuring 44mm.  # very mild bilateral carotid artery stenosis  Recommend the following which can slow the progression of atherosclerosis and reduce the risk of major adverse cardiac / limb events:  Complete cessation from all tobacco products. Blood glucose control with goal A1c < 7%. Blood pressure control with goal blood pressure < 140/90 mmHg. Lipid reduction therapy with goal LDL-C <100 mg/dL (<93 if symptomatic from PAD).  Aspirin  81mg  PO QD.  Atorvastatin 40-80mg  PO QD (or other high intensity statin therapy).  Follow-up with me in 1 year with repeat duplex of abdominal aorta.  CHIEF COMPLAINT: Surveillance of known abdominal aortic aneurysm carotid artery stenosis.  HISTORY OF PRESENT ILLNESS: Raymond Morris is a 89 y.o. male who presents to clinic for surveillance of carotid artery stenosis and abdominal aortic aneurysm.  The patient is doing well from a neurologic standpoint.  He reports no new focal symptoms.  Does have some chronic abdominal pain attributed to an umbilical hernia.  He has a known small infrarenal abdominal aortic aneurysm.  The bulk of the visit was spent discussing the natural history of abdominal aortic aneurysm disease and the rationale for surveillance.  06/20/22: She returns clinic for surveillance of the small abdominal aortic aneurysm.  We reviewed his duplex findings today.  He has grown a 43 mm.  I counseled him this is typical of aneurysm disease, quoting him a average rate of growth of 0.25 cm/year.  06/19/23: Patient returns to clinic for surveillance of small abdominal aortic aneurysm.  Minimal change today on duplex.  I reviewed this with the patient in detail.  Past Medical History:  Diagnosis Date   AAA (abdominal aortic aneurysm) (HCC)    AAA (abdominal aortic aneurysm) (HCC)    Anxiety    Arthritis    thumbs;  hands (10/08/2013)   Asthma    Carotid artery occlusion    Cellulitis of right foot 10/2013   Chronic bronchitis (HCC)    just about q yr   Chronic lower back pain    COPD (chronic obstructive pulmonary disease) (HCC)    CVA (cerebral infarction)    per MRA done 2002   Depression    Diverticulosis    Edema    chronic, lower extremities   GERD (gastroesophageal reflux disease)    Gout    11/2013 surgeries   Hiatal hernia    Hypercholesterolemia    Hyperlipidemia    Hypertension    Monoclonal gammopathy    DR Maria Shiner, 2007,felt to be benign   Phlebitis    right leg   Pneumonia 9 times   (10/08/2013)   Stroke [facility]) 2002   didn't know I'd had one; just told me about it in 2015   Upper respiratory infection    I've had lots   Urinary frequency    elevated PSA , with prostate nodule, Dr [id number]    Past Surgical History:  Procedure Laterality Date   APPENDECTOMY     COLONOSCOPY N/A 08/13/2015   Procedure: COLONOSCOPY;  Surgeon: Alvis Jourdain, MD;  Location: [facility];  Service: Endoscopy;  Laterality: N/A;   EXCISIONAL HEMORRHOIDECTOMY     HERNIA REPAIR     UMBILICAL HERNIA REPAIR     VASECTOMY      Family History  Problem Relation Age of Onset   Cancer Mother    Cancer Father    Stroke Sister  Liver disease Sister    Pneumonia Daughter        pnemoccoal, deceased   Cerebral palsy Son     Social History   Socioeconomic History   Marital status: Widowed    Spouse name: Not on file   Number of children: Not on file   Years of education: Not on file   Highest education level: Not on file  Occupational History   Not on file  Tobacco Use   Smoking status: Former    Current packs/day: 0.00    Average packs/day: 0.5 packs/day for 28.0 years (14.0 ttl pk-yrs)    Types: Cigarettes    Start date: 09/07/1948    Quit date: 09/07/1976    Years since quitting: 46.8   Smokeless tobacco: Never  Vaping Use   Vaping status: Never Used  Substance and  Sexual Activity   Alcohol use: No    Alcohol/week: 0.0 standard drinks of alcohol   Drug use: No   Sexual activity: Not on file  Other Topics Concern   Not on file  Social History Narrative   Not on file   Social Drivers of Health   Financial Resource Strain: Not on file  Food Insecurity: Not on file  Transportation Needs: Not on file  Physical Activity: Not on file  Stress: Not on file  Social Connections: Not on file  Intimate Partner Violence: Not on file    Allergies  Allergen Reactions   Ceclor [Cefaclor] Other (See Comments)    Causes muscle spasms   Bactrim [Sulfamethoxazole-Trimethoprim]    Spironolactone     Other reaction(s): gynecomastia   Sulfa Antibiotics Rash    Current Outpatient Medications  Medication Sig Dispense Refill   albuterol  (VENTOLIN  HFA) 108 (90 Base) MCG/ACT inhaler Inhale 2 puffs into the lungs every 6 (six) hours as needed for wheezing or shortness of breath. 8 g 6   aspirin  EC 81 MG tablet Take 81 mg by mouth daily.     azithromycin  (ZITHROMAX ) 250 MG tablet Take 1 tablet (250 mg total) by mouth daily. Take first 2 tablets together, then 1 every day until finished. 6 tablet 0   carvedilol (COREG) 12.5 MG tablet Take 12.5 mg by mouth 2 (two) times daily with a meal.     escitalopram  (LEXAPRO ) 20 MG tablet Take 20 mg by mouth daily. 1/2 -1 tablet once daily     esomeprazole (NEXIUM) 20 MG capsule Take 20 mg by mouth daily before breakfast.     finasteride  (PROSCAR ) 5 MG tablet Take 1 tablet (5 mg total) by mouth daily. 30 tablet 0   fluticasone -salmeterol (WIXELA INHUB) 250-50 MCG/ACT AEPB Inhale 1 puff into the lungs in the morning and at bedtime. 1 each 11   furosemide (LASIX) 20 MG tablet      olmesartan (BENICAR) 20 MG tablet Take 20 mg by mouth daily.     potassium chloride  (KLOR-CON ) 10 MEQ tablet Take 10 mEq by mouth daily.     rosuvastatin  (CRESTOR ) 20 MG tablet Take 10 mg by mouth daily.     Tamsulosin  HCl (FLOMAX ) 0.4 MG CAPS Take  0.4 mg by mouth daily.     No current facility-administered medications for this visit.    PHYSICAL EXAM Vitals:   06/19/23 1040  BP: (!) 148/78  Pulse: (!) 57  Resp: 20  Temp: 98.4 F (36.9 C)  SpO2: 93%  Weight: 239 lb (108.4 kg)  Height: 5' 8 (1.727 m)     Constitutional: Well-appearing elderly  man in no acute distress Cardiac: regular rate and rhythm.  Respiratory:  unlabored. Abdominal:  soft, tender about midline fascial defect about the umbilicus.  Non-distended.   PERTINENT LABORATORY AND RADIOLOGIC DATA  Most recent CBC    Latest Ref Rng & Units 01/12/2023   10:16 AM 07/14/2022   10:31 AM 11/04/2021    2:30 PM  CBC  WBC 4.0 - 10.5 K/uL 5.9  6.7  11.7   Hemoglobin 13.0 - 17.0 g/dL 16.1  09.6  04.5   Hematocrit 39.0 - 52.0 % 43.4  42.6  38.7   Platelets 150 - 400 K/uL 196  170  176      Most recent CMP    Latest Ref Rng & Units 01/12/2023   10:16 AM 07/14/2022   10:31 AM 11/04/2021    2:30 PM  CMP  Glucose 70 - 99 mg/dL 409  99  811   BUN 8 - 23 mg/dL 12  19  16    Creatinine 0.61 - 1.24 mg/dL 9.14  7.82  9.56   Sodium 135 - 145 mmol/L 138  138  138   Potassium 3.5 - 5.1 mmol/L 3.8  4.2  3.7   Chloride 98 - 111 mmol/L 104  103  103   CO2 22 - 32 mmol/L 29  29  24    Calcium  8.9 - 10.3 mg/dL 9.5  21.3  9.3   Total Protein 6.5 - 8.1 g/dL 6.2  6.5    Total Bilirubin 0.0 - 1.2 mg/dL 0.8  0.8    Alkaline Phos 38 - 126 U/L 48  44    AST 15 - 41 U/L 18  18    ALT 0 - 44 U/L 14  16     Abdominal Aorta: There is evidence of abnormal dilatation of the mid and  distal Abdominal aorta. The largest aortic measurement is 4.4 cm. The  largest aortic diameter remains essentially unchanged compared to prior  exam. Previous diameter measurement was   4.3 cm obtained on 06/20/2022.   Stenosis: +------------------+-----------+  Location          Comments     +------------------+-----------+  Right Common Iliacno stenosis  +------------------+-----------+   Left Common Iliac no stenosis  +------------------+-----------+      IVC/Iliac: Patent IVC.   Heber Little. Edgardo Goodwill, MD [facility] Phone Number: [phone] 06/18/2023 9:36 PM  Total time spent on preparing this encounter including chart review, data review, collecting history, examining the patient, coordinating care for this established patient, 40 minutes.  Portions of this report may have been transcribed using voice recognition software.  Every effort has been made to ensure accuracy; however, inadvertent computerized transcription errors may still be present.

## 2023-06-19 ENCOUNTER — Ambulatory Visit: Admitting: Vascular Surgery

## 2023-06-19 ENCOUNTER — Encounter: Payer: Self-pay | Admitting: Vascular Surgery

## 2023-06-19 ENCOUNTER — Ambulatory Visit (HOSPITAL_COMMUNITY)
Admission: RE | Admit: 2023-06-19 | Discharge: 2023-06-19 | Disposition: A | Discharge status: Home / Self Care | Source: Ambulatory Visit | Attending: Vascular Surgery | Admitting: Vascular Surgery

## 2023-06-19 VITALS — BP 148/78 | HR 57 | Temp 98.4°F | Resp 20 | Ht 68.0 in | Wt 239.0 lb

## 2023-06-19 DIAGNOSIS — I7143 Infrarenal abdominal aortic aneurysm, without rupture: Secondary | ICD-10-CM | POA: Diagnosis not present

## 2023-06-19 DIAGNOSIS — I714 Abdominal aortic aneurysm, without rupture, unspecified: Secondary | ICD-10-CM

## 2023-06-27 ENCOUNTER — Ambulatory Visit: Admitting: Physician Assistant

## 2023-07-13 ENCOUNTER — Other Ambulatory Visit: Payer: Self-pay | Admitting: Hematology and Oncology

## 2023-07-13 ENCOUNTER — Inpatient Hospital Stay: Payer: HMO | Attending: Hematology and Oncology

## 2023-07-13 DIAGNOSIS — Z87891 Personal history of nicotine dependence: Secondary | ICD-10-CM | POA: Insufficient documentation

## 2023-07-13 DIAGNOSIS — Z79899 Other long term (current) drug therapy: Secondary | ICD-10-CM | POA: Insufficient documentation

## 2023-07-13 DIAGNOSIS — D472 Monoclonal gammopathy: Secondary | ICD-10-CM | POA: Insufficient documentation

## 2023-07-19 ENCOUNTER — Telehealth: Payer: Self-pay | Admitting: Hematology and Oncology

## 2023-07-20 ENCOUNTER — Inpatient Hospital Stay: Payer: HMO | Admitting: Hematology and Oncology

## 2023-07-25 ENCOUNTER — Inpatient Hospital Stay

## 2023-07-25 DIAGNOSIS — Z87891 Personal history of nicotine dependence: Secondary | ICD-10-CM | POA: Diagnosis not present

## 2023-07-25 DIAGNOSIS — D472 Monoclonal gammopathy: Secondary | ICD-10-CM

## 2023-07-25 DIAGNOSIS — Z79899 Other long term (current) drug therapy: Secondary | ICD-10-CM | POA: Diagnosis not present

## 2023-07-25 LAB — CMP (CANCER CENTER ONLY)
ALT: 14 U/L (ref 0–44)
AST: 16 U/L (ref 15–41)
Albumin: 3.6 g/dL (ref 3.5–5.0)
Alkaline Phosphatase: 47 U/L (ref 38–126)
Anion gap: 4 — ABNORMAL LOW (ref 5–15)
BUN: 14 mg/dL (ref 8–23)
CO2: 31 mmol/L (ref 22–32)
Calcium: 9.1 mg/dL (ref 8.9–10.3)
Chloride: 102 mmol/L (ref 98–111)
Creatinine: 0.9 mg/dL (ref 0.61–1.24)
GFR, Estimated: >60 mL/min (ref >60)
Glucose, Bld: 99 mg/dL (ref 70–99)
Potassium: 3.7 mmol/L (ref 3.5–5.1)
Sodium: 137 mmol/L (ref 135–145)
Total Bilirubin: 0.9 mg/dL (ref 0.0–1.2)
Total Protein: 6.1 g/dL — ABNORMAL LOW (ref 6.5–8.1)

## 2023-07-25 LAB — CBC WITH DIFFERENTIAL (CANCER CENTER ONLY)
Abs Immature Granulocytes: 0.01 K/uL (ref 0.00–0.07)
Basophils Absolute: 0.1 K/uL (ref 0.0–0.1)
Basophils Relative: 1 %
Eosinophils Absolute: 0.2 K/uL (ref 0.0–0.5)
Eosinophils Relative: 3 %
HCT: 40.7 % (ref 39.0–52.0)
Hemoglobin: 13.7 g/dL (ref 13.0–17.0)
Immature Granulocytes: 0 %
Lymphocytes Relative: 25 %
Lymphs Abs: 1.4 K/uL (ref 0.7–4.0)
MCH: 31.4 pg (ref 26.0–34.0)
MCHC: 33.7 g/dL (ref 30.0–36.0)
MCV: 93.3 fL (ref 80.0–100.0)
Monocytes Absolute: 0.6 K/uL (ref 0.1–1.0)
Monocytes Relative: 11 %
Neutro Abs: 3.2 K/uL (ref 1.7–7.7)
Neutrophils Relative %: 60 %
Platelet Count: 164 K/uL (ref 150–400)
RBC: 4.36 MIL/uL (ref 4.22–5.81)
RDW: 13.1 % (ref 11.5–15.5)
WBC Count: 5.4 K/uL (ref 4.0–10.5)
nRBC: 0 % (ref 0.0–0.2)

## 2023-07-25 LAB — LACTATE DEHYDROGENASE: LDH: 143 U/L (ref 98–192)

## 2023-07-26 LAB — KAPPA/LAMBDA LIGHT CHAINS
Kappa free light chain: 8.4 mg/L (ref 3.3–19.4)
Kappa, lambda light chain ratio: 0.63 (ref 0.26–1.65)
Lambda free light chains: 13.3 mg/L (ref 5.7–26.3)

## 2023-07-27 LAB — MULTIPLE MYELOMA PANEL, SERUM
Albumin SerPl Elph-Mcnc: 3.4 g/dL (ref 2.9–4.4)
Albumin/Glob SerPl: 1.5 (ref 0.7–1.7)
Alpha 1: 0.1 g/dL (ref 0.0–0.4)
Alpha2 Glob SerPl Elph-Mcnc: 0.6 g/dL (ref 0.4–1.0)
B-Globulin SerPl Elph-Mcnc: 0.8 g/dL (ref 0.7–1.3)
Gamma Glob SerPl Elph-Mcnc: 0.7 g/dL (ref 0.4–1.8)
Globulin, Total: 2.3 g/dL (ref 2.2–3.9)
IgA: 131 mg/dL (ref 61–437)
IgG (Immunoglobin G), Serum: 927 mg/dL (ref 603–1613)
IgM (Immunoglobulin M), Srm: 19 mg/dL (ref 15–143)
M Protein SerPl Elph-Mcnc: 0.4 g/dL — ABNORMAL HIGH
Total Protein ELP: 5.7 g/dL — ABNORMAL LOW (ref 6.0–8.5)

## 2023-07-31 ENCOUNTER — Inpatient Hospital Stay: Admitting: Hematology and Oncology

## 2023-07-31 VITALS — BP 142/79 | HR 60 | Temp 98.1°F | Resp 13 | Wt 239.5 lb

## 2023-07-31 DIAGNOSIS — D472 Monoclonal gammopathy: Secondary | ICD-10-CM

## 2023-07-31 NOTE — Progress Notes (Signed)
 [facility] [phone]   [phone]  PROGRESS NOTE  Patient Care Team: Clarice Nottingham, MD as PCP - General (Internal Medicine) Loni Soyla LABOR, MD as PCP - Cardiology (Cardiology) Blanca Elsie RAMAN, MD (Cardiology) Nicholaus Tanda LITTIE DOUGLAS, MD as Attending Physician (Urology) Early, Krystal FALCON, MD (Inactive) as Consulting Physician (Vascular Surgery)  Hematological/Oncological History # IgG Kappa Monoclonal Gammopathy of undetermined significance 05/18/2022: M protein 0.2, Kappa 28.9, Lambda 15.6, Kappa/lambda ratio 1.85 07/14/2022: establish care with Dr. Federico   Interval History:  Raymond Morris 89 y.o. male with medical history significant for IgG kappa MGUS who presents for a follow up visit. The patient's last visit was on 01/19/2023. In the interim since the last visit he has had no major changes in his health.  On exam today Raymond Morris reports he is doing his best to try to stay cool and hydrated and avoid the humid weather.  He reports he is had no major changes in his health in the last 6 months.  He reports he does have some trouble keeping up.  He reports he is out of shape.  He was outside for about 45 minutes yesterday and his muscles became quite sore.  He also has a chronic back pain but no recent worsening or no other bone pain.  He reports he does have some occasional lightheadedness, dizziness, shortness of breath.  He does his best to try to stay strong but he has difficulty maintaining his balance.  He reports that he has been eating well.  He notes that he forgot to take his blood pressure medication this morning which explains the slight increase in his blood pressure.  Otherwise he feels well and has no questions concerns or complaints today.  A full 10 point ROS is otherwise negative.  MEDICAL HISTORY:  Past Medical History:  Diagnosis Date   AAA (abdominal aortic aneurysm) (HCC)    AAA (abdominal aortic aneurysm) (HCC)    Anxiety     Arthritis    thumbs; hands (10/08/2013)   Asthma    Carotid artery occlusion    Cellulitis of right foot 10/2013   Chronic bronchitis (HCC)    just about q yr   Chronic lower back pain    COPD (chronic obstructive pulmonary disease) (HCC)    CVA (cerebral infarction)    per MRA done 2002   Depression    Diverticulosis    Edema    chronic, lower extremities   GERD (gastroesophageal reflux disease)    Gout    11/2013 surgeries   Hiatal hernia    Hypercholesterolemia    Hyperlipidemia    Hypertension    Monoclonal gammopathy    DR Timmy, 2007,felt to be benign   Phlebitis    right leg   Pneumonia 9 times   (10/08/2013)   Stroke [facility]) 2002   didn't know I'd had one; just told me about it in 2015   Upper respiratory infection    I've had lots   Urinary frequency    elevated PSA , with prostate nodule, Dr [id number]    SURGICAL HISTORY: Past Surgical History:  Procedure Laterality Date   APPENDECTOMY     COLONOSCOPY N/A 08/13/2015   Procedure: COLONOSCOPY;  Surgeon: Belvie Just, MD;  Location: [facility];  Service: Endoscopy;  Laterality: N/A;   EXCISIONAL HEMORRHOIDECTOMY     HERNIA REPAIR     UMBILICAL HERNIA REPAIR     VASECTOMY      SOCIAL  HISTORY: Social History   Socioeconomic History   Marital status: Widowed    Spouse name: Not on file   Number of children: Not on file   Years of education: Not on file   Highest education level: Not on file  Occupational History   Not on file  Tobacco Use   Smoking status: Former    Current packs/day: 0.00    Average packs/day: 0.5 packs/day for 28.0 years (14.0 ttl pk-yrs)    Types: Cigarettes    Start date: 09/07/1948    Quit date: 09/07/1976    Years since quitting: 46.9   Smokeless tobacco: Never  Vaping Use   Vaping status: Never Used  Substance and Sexual Activity   Alcohol use: No    Alcohol/week: 0.0 standard drinks of alcohol   Drug use: No   Sexual activity: Not on file  Other Topics  Concern   Not on file  Social History Narrative   Not on file   Social Drivers of Health   Financial Resource Strain: Not on file  Food Insecurity: Not on file  Transportation Needs: Not on file  Physical Activity: Not on file  Stress: Not on file  Social Connections: Not on file  Intimate Partner Violence: Not on file    FAMILY HISTORY: Family History  Problem Relation Age of Onset   Cancer Mother    Cancer Father    Stroke Sister    Liver disease Sister    Pneumonia Daughter        pnemoccoal, deceased   Cerebral palsy Son     ALLERGIES:  is allergic to ceclor [cefaclor], bactrim [sulfamethoxazole-trimethoprim], spironolactone, and sulfa antibiotics.  MEDICATIONS:  Current Outpatient Medications  Medication Sig Dispense Refill   albuterol  (VENTOLIN  HFA) 108 (90 Base) MCG/ACT inhaler Inhale 2 puffs into the lungs every 6 (six) hours as needed for wheezing or shortness of breath. 8 g 6   aspirin  EC 81 MG tablet Take 81 mg by mouth daily.     azithromycin  (ZITHROMAX ) 250 MG tablet Take 1 tablet (250 mg total) by mouth daily. Take first 2 tablets together, then 1 every day until finished. 6 tablet 0   carvedilol (COREG) 12.5 MG tablet Take 12.5 mg by mouth 2 (two) times daily with a meal.     escitalopram  (LEXAPRO ) 20 MG tablet Take 20 mg by mouth daily. 1/2 -1 tablet once daily     esomeprazole (NEXIUM) 20 MG capsule Take 20 mg by mouth daily before breakfast.     finasteride  (PROSCAR ) 5 MG tablet Take 1 tablet (5 mg total) by mouth daily. 30 tablet 0   fluticasone -salmeterol (WIXELA INHUB) 250-50 MCG/ACT AEPB Inhale 1 puff into the lungs in the morning and at bedtime. 1 each 11   furosemide (LASIX) 20 MG tablet      olmesartan (BENICAR) 20 MG tablet Take 20 mg by mouth daily.     potassium chloride  (KLOR-CON ) 10 MEQ tablet Take 10 mEq by mouth daily.     rosuvastatin  (CRESTOR ) 20 MG tablet Take 10 mg by mouth daily.     Tamsulosin  HCl (FLOMAX ) 0.4 MG CAPS Take 0.4 mg by  mouth daily.     No current facility-administered medications for this visit.    REVIEW OF SYSTEMS:   Constitutional: ( - ) fevers, ( - )  chills , ( - ) night sweats Eyes: ( - ) blurriness of vision, ( - ) double vision, ( - ) watery eyes Ears, nose, mouth, throat,  and face: ( - ) mucositis, ( - ) sore throat Respiratory: ( - ) cough, ( - ) dyspnea, ( - ) wheezes Cardiovascular: ( - ) palpitation, ( - ) chest discomfort, ( - ) lower extremity swelling Gastrointestinal:  ( - ) nausea, ( - ) heartburn, ( - ) change in bowel habits Skin: ( - ) abnormal skin rashes Lymphatics: ( - ) new lymphadenopathy, ( - ) easy bruising Neurological: ( - ) numbness, ( - ) tingling, ( - ) new weaknesses Behavioral/Psych: ( - ) mood change, ( - ) new changes  All other systems were reviewed with the patient and are negative.  PHYSICAL EXAMINATION: Vitals:   07/31/23 1116  BP: (!) 142/79  Pulse: 60  Resp: 13  Temp: 98.1 F (36.7 C)  SpO2: 94%   Filed Weights   07/31/23 1116  Weight: 239 lb 8 oz (108.6 kg)    GENERAL: Well-appearing elderly Caucasian male.  Alert, no distress and comfortable SKIN: skin color, texture, turgor are normal, no rashes or significant lesions EYES: conjunctiva are pink and non-injected, sclera clear LUNGS: clear to auscultation and percussion with normal breathing effort HEART: regular rate & rhythm and no murmurs and no lower extremity edema Musculoskeletal: no cyanosis of digits and no clubbing  PSYCH: alert & oriented x 3, fluent speech NEURO: no focal motor/sensory deficits  LABORATORY DATA:  I have reviewed the data as listed    Latest Ref Rng & Units 07/25/2023   11:07 AM 01/12/2023   10:16 AM 07/14/2022   10:31 AM  CBC  WBC 4.0 - 10.5 K/uL 5.4  5.9  6.7   Hemoglobin 13.0 - 17.0 g/dL 86.2  85.4  85.4   Hematocrit 39.0 - 52.0 % 40.7  43.4  42.6   Platelets 150 - 400 K/uL 164  196  170        Latest Ref Rng & Units 07/25/2023   11:07 AM 01/12/2023    10:16 AM 07/14/2022   10:31 AM  CMP  Glucose 70 - 99 mg/dL 99  889  99   BUN 8 - 23 mg/dL 14  12  19    Creatinine 0.61 - 1.24 mg/dL 9.09  9.09  9.03   Sodium 135 - 145 mmol/L 137  138  138   Potassium 3.5 - 5.1 mmol/L 3.7  3.8  4.2   Chloride 98 - 111 mmol/L 102  104  103   CO2 22 - 32 mmol/L 31  29  29    Calcium  8.9 - 10.3 mg/dL 9.1  9.5  89.9   Total Protein 6.5 - 8.1 g/dL 6.1  6.2  6.5   Total Bilirubin 0.0 - 1.2 mg/dL 0.9  0.8  0.8   Alkaline Phos 38 - 126 U/L 47  48  44   AST 15 - 41 U/L 16  18  18    ALT 0 - 44 U/L 14  14  16      Lab Results  Component Value Date   MPROTEIN 0.4 (H) 07/25/2023   MPROTEIN 0.3 (H) 01/12/2023   MPROTEIN 0.4 (H) 07/14/2022   Lab Results  Component Value Date   KPAFRELGTCHN 8.4 07/25/2023   KPAFRELGTCHN 29.0 (H) 01/12/2023   KPAFRELGTCHN 34.2 (H) 07/14/2022   LAMBDASER 13.3 07/25/2023   LAMBDASER 13.8 01/12/2023   LAMBDASER 15.4 07/14/2022   KAPLAMBRATIO 0.63 07/25/2023   KAPLAMBRATIO 2.10 (H) 01/12/2023   KAPLAMBRATIO 8.18 07/18/2022   RADIOGRAPHIC STUDIES: No results found.  ASSESSMENT & PLAN  Raymond Morris 89 y.o. male with medical history significant for IgG kappa MGUS who presents for a follow up visit.  Monoclonal Gammopathies are a group of medical conditions defined by the presence of a monoclonal protein (an M protein) in the blood or urine. Monoclonal gammopathies include monoclonal gammopathy of unknown significance (MGUS), Monoclonal gammopathies of renal or neurological significance,  smoldering multiple myeloma (SMM), multiple myeloma (MM), AL amyloidosis, and Waldenstrom macroglobulinemia. The goal of the initial workup is to determine which monoclonal gammopathy a patient has. The workup consists of evaluating protein in the serum (with serum protein electrophoresis (SPEP) and serum free light chains) , evaluating protein in the urine (UPEP), and evaluation of the skeleton (DG Bone Met Survey) to assure no lytic lesions.  Baseline bloodwork includes CMP and CBC. If no CRAB criteria or high risk criteria are noted then the diagnosis is MGUS. MGUS must be followed with bloodwork periodically to assure it does not convert to multiple myeloma (occurs to approximately 1% of patients per year). If there are CRAB criteria or high risk features (such as elevated serum free light chain ratio (taking into account renal function), a non IgG M protein, or M protein >1.5) then a bone marrow biopsy must be pursued.    #IgG Kappa Monoclonal Gammopathy of Undetermined Significance -- will order an SPEP,  SFLC and CBC, CMP, and LDH at each visit  --recommend a metastatic bone survey to assess for lytic lesions every 1-2 years  -- No indication for a bone marrow biopsy at this time -- Labs today show white blood cell 5.4, hemoglobin 13.7, MCV 93.3, platelets 164.  Creatinine LFTs within normal limits --Myeloma labs on 07/25/2023 show M protein 0.4, kappa 8.4, lambda 13.3, ratio 0.63 --RTC in 6 months or sooner if intervention is required.  If stable at that time would recommend considering enrolling the patient in the MGUS clinic with yearly follow-ups.   No orders of the defined types were placed in this encounter.   All questions were answered. The patient knows to call the clinic with any problems, questions or concerns.  A total of more than 30 minutes were spent on this encounter with face-to-face time and non-face-to-face time, including preparing to see the patient, ordering tests and/or medications, counseling the patient and coordination of care as outlined above.   Norleen IVAR Kidney, MD Department of Hematology/Oncology [facility] Phone: [phone] Pager: [phone] Email: [email or web address]  07/31/2023 2:57 PM

## 2023-08-14 DIAGNOSIS — Z79899 Other long term (current) drug therapy: Secondary | ICD-10-CM | POA: Diagnosis not present

## 2023-08-14 DIAGNOSIS — E7800 Pure hypercholesterolemia, unspecified: Secondary | ICD-10-CM | POA: Diagnosis not present

## 2023-08-14 DIAGNOSIS — Z8673 Personal history of transient ischemic attack (TIA), and cerebral infarction without residual deficits: Secondary | ICD-10-CM | POA: Diagnosis not present

## 2023-08-14 DIAGNOSIS — R7303 Prediabetes: Secondary | ICD-10-CM | POA: Diagnosis not present

## 2023-08-14 DIAGNOSIS — I10 Essential (primary) hypertension: Secondary | ICD-10-CM | POA: Diagnosis not present

## 2023-08-20 NOTE — Progress Notes (Unsigned)
 08/21/2023 Raymond Morris [id number] 20-May-1931  Referring provider: Clarice Nottingham, MD Primary GI doctor: Dr. Charlanne  ASSESSMENT AND PLAN:  Chronic idiopathic constipation Hard stools, straining, every other day, started on miralax every other day and had Bm daily, no further bleeding Worsening symptoms since previous hemorrhoidectomy years ago 07/25/23 no anemia, no weight loss - Increase fiber/ water intake, decrease caffeine, increase activity level. -Will get CT AB and pelvis with contrast - check TSH celiac, ESR -Will add on Miralax daily and Benefiber -possible component of pelvis floor dysfunction with history and symptoms. Consider pelvic floor - follow up 2- 3 months  Rectal bleeding with fissure from straining Had BRB intermittent with straining for last 10-15 years, in toilet and on TP, no rectal pain, itching or burning Treated with miralax last month and symptoms have resolved Declines rectal exam, no anemia Consider exam if symptoms reoccur  Personal history of polyps Colonoscopy 08/2015 Dr. Rollin good prep 8 TA polyps 2 to 5 mm transverse colon hepatic flexure ascending colon diverticulosis sigmoid colon Recall 3 years  IgG kappa monoclonal gammopathy of undetermined significance Follows with Dr. Federico in hematology last seen 07/31/2023  Infrarenal abdominal aortic aneurysm Follows with Dr. Magda vascular  CVA noted on MRI 2002 Telemetry no A-fib 2022 Echo 2021 EF 55 to 60% grade 1 diastolic dysfunction no aortic stenosis  DOE  Worse with exertion, some mucus, cough, no chest pain with exertion, no leg swelling No smoking history Consider repeat CXR, lungs CTAB on examination Possibly deconditioning  Patient Care Team: Clarice Nottingham, MD as PCP - General (Internal Medicine) Loni Soyla LABOR, MD as PCP - Cardiology (Cardiology) Blanca Elsie RAMAN, MD (Cardiology) Nicholaus Tanda LITTIE DOUGLAS, MD as Attending Physician (Urology) Early, Krystal FALCON, MD (Inactive)  as Consulting Physician (Vascular Surgery)  HISTORY OF PRESENT ILLNESS: 89 y.o. male with a past medical history listed below presents as a new patient for evaluation of constipation.   Patient previously seen by Dr. Rollin in 2017 and Dr.Menon in 2021 for personal history of polyps and constipation.  Discussed the use of AI scribe software for clinical note transcription with the patient, who gave verbal consent to proceed.  History of Present Illness   Raymond Morris is an 89 year old male who presents with chronic constipation and rectal bleeding.  He has experienced long-standing constipation, with bowel movements occurring every other day or sometimes missing a day, leading to straining and the development of a fissure. Bright red blood has accompanied bowel movements for 15 to 20 years, typically associated with large bowel movements and appearing on toilet paper or in the toilet. No rectal pain, burning, or itching is associated with the bleeding.  He was previously prescribed Miralax, which he used every other day, resulting in improved bowel movements. Since using Miralax, he has been having daily bowel movements and has not experienced bleeding recently. Occasionally, he has experienced diarrhea since using Miralax. He has not used Miralax in a few weeks but continues to have regular bowel movements.  He has a history of a hemorrhoidectomy performed many years ago, contributing to difficulties in passing stool. No significant weight loss has been noted, but rather weight gain.  He experiences shortness of breath with exertion, such as when pulling garbage cans to the mailbox, and occasional coughing with mucus production. He received an RSV shot a few months ago and has not experienced significant respiratory illness since. No chest pain with exertion, but occasional pain  without exertion, for which he uses nitroglycerin infrequently.  He has a history of smoking, having quit on September 07, 1976, and denies alcohol use. He reports difficulty with urination, including hesitancy and dribbling, and is awaiting follow-up with a urologist.      He  reports that he quit smoking about 46 years ago. His smoking use included cigarettes. He started smoking about 75 years ago. He has a 14 pack-year smoking history. He has never used smokeless tobacco. He reports that he does not drink alcohol and does not use drugs.  RELEVANT GI HISTORY, IMAGING AND LABS: Results   LABS Complete blood count: No anemia (07/30/2023)      CBC    Component Value Date/Time   WBC 5.4 07/25/2023 1107   WBC 11.7 (H) 11/04/2021 1430   RBC 4.36 07/25/2023 1107   HGB 13.7 07/25/2023 1107   HCT 40.7 07/25/2023 1107   PLT 164 07/25/2023 1107   MCV 93.3 07/25/2023 1107   MCH 31.4 07/25/2023 1107   MCHC 33.7 07/25/2023 1107   RDW 13.1 07/25/2023 1107   LYMPHSABS 1.4 07/25/2023 1107   MONOABS 0.6 07/25/2023 1107   EOSABS 0.2 07/25/2023 1107   BASOSABS 0.1 07/25/2023 1107   Recent Labs    01/12/23 1016 07/25/23 1107  HGB 14.5 13.7    CMP     Component Value Date/Time   NA 137 07/25/2023 1107   K 3.7 07/25/2023 1107   CL 102 07/25/2023 1107   CO2 31 07/25/2023 1107   GLUCOSE 99 07/25/2023 1107   BUN 14 07/25/2023 1107   CREATININE 0.90 07/25/2023 1107   CALCIUM  9.1 07/25/2023 1107   PROT 6.1 (L) 07/25/2023 1107   ALBUMIN 3.6 07/25/2023 1107   AST 16 07/25/2023 1107   ALT 14 07/25/2023 1107   ALKPHOS 47 07/25/2023 1107   BILITOT 0.9 07/25/2023 1107   GFRNONAA >60 07/25/2023 1107   GFRAA 73 (L) 10/10/2013 0500      Latest Ref Rng & Units 07/25/2023   11:07 AM 01/12/2023   10:16 AM 07/14/2022   10:31 AM  Hepatic Function  Total Protein 6.5 - 8.1 g/dL 6.1  6.2  6.5   Albumin 3.5 - 5.0 g/dL 3.6  3.8  4.0   AST 15 - 41 U/L 16  18  18    ALT 0 - 44 U/L 14  14  16    Alk Phosphatase 38 - 126 U/L 47  48  44   Total Bilirubin 0.0 - 1.2 mg/dL 0.9  0.8  0.8       Current Medications:     Current Outpatient Medications (Cardiovascular):    carvedilol (COREG) 12.5 MG tablet, Take 12.5 mg by mouth 2 (two) times daily with a meal.   furosemide (LASIX) 20 MG tablet,    nitroGLYCERIN (NITROSTAT) 0.4 MG SL tablet, Place 0.4 mg under the tongue every 5 (five) minutes as needed for chest pain.   olmesartan (BENICAR) 20 MG tablet, Take 20 mg by mouth daily.   rosuvastatin  (CRESTOR ) 20 MG tablet, Take 10 mg by mouth daily.  Current Outpatient Medications (Respiratory):    albuterol  (VENTOLIN  HFA) 108 (90 Base) MCG/ACT inhaler, Inhale 2 puffs into the lungs every 6 (six) hours as needed for wheezing or shortness of breath.   cetirizine (ZYRTEC) 10 MG tablet, Take 10 mg by mouth daily.   fluticasone -salmeterol (WIXELA INHUB) 250-50 MCG/ACT AEPB, Inhale 1 puff into the lungs in the morning and at bedtime.  Current Outpatient Medications (  Analgesics):    aspirin  EC 81 MG tablet, Take 81 mg by mouth daily.   Current Outpatient Medications (Other):    escitalopram  (LEXAPRO ) 20 MG tablet, Take 20 mg by mouth daily. 1/2 -1 tablet once daily   esomeprazole (NEXIUM) 20 MG capsule, Take 20 mg by mouth daily before breakfast.   finasteride  (PROSCAR ) 5 MG tablet, Take 1 tablet (5 mg total) by mouth daily.   potassium chloride  (KLOR-CON ) 10 MEQ tablet, Take 10 mEq by mouth daily.   Tamsulosin  HCl (FLOMAX ) 0.4 MG CAPS, Take 0.4 mg by mouth daily.  Medical History:  Past Medical History:  Diagnosis Date   AAA (abdominal aortic aneurysm) (HCC)    Anxiety    Arthritis    thumbs; hands (10/08/2013)   Asthma    Carotid artery occlusion    Cellulitis of right foot 10/02/2013   Chronic bronchitis (HCC)    just about q yr   Chronic lower back pain    COPD (chronic obstructive pulmonary disease) (HCC)    CVA (cerebral infarction)    per MRA done 2002   Depression    Diverticulosis    Edema    chronic, lower extremities   GERD (gastroesophageal reflux disease)    Gout    11/2013  surgeries   Hiatal hernia    Hypercholesterolemia    Hyperlipidemia    Hypertension    Monoclonal gammopathy    DR Timmy, 2007,felt to be benign   Phlebitis    right leg   Pneumonia 9 times   (10/08/2013)   Stroke (HCC) 01/03/2000   didn't know I'd had one; just told me about it in 2015   Upper respiratory infection    I've had lots   Urinary frequency    elevated PSA , with prostate nodule, Dr [id number]   Allergies:  Allergies  Allergen Reactions   Ceclor [Cefaclor] Other (See Comments)    Causes muscle spasms   Bactrim [Sulfamethoxazole-Trimethoprim]    Spironolactone     Other reaction(s): gynecomastia   Sulfa Antibiotics Rash     Surgical History:  He  has a past surgical history that includes Appendectomy; Umbilical hernia repair; Excisional hemorrhoidectomy; Vasectomy; Hernia repair; and Colonoscopy (N/A, 08/13/2015). Family History:  His family history includes Cancer in his father and mother; Cerebral palsy in his son; Liver disease in his sister; Pneumonia in his daughter; Stroke in his sister.  REVIEW OF SYSTEMS  : All other systems reviewed and negative except where noted in the History of Present Illness.  PHYSICAL EXAM: BP 136/72 (BP Location: Left Arm, Patient Position: Sitting, Cuff Size: Large)   Pulse (!) 56   Ht 5' 8 (1.727 m)   Wt 239 lb 4 oz (108.5 kg)   BMI 36.38 kg/m  Physical Exam   GENERAL APPEARANCE: Obese, elderly in no apparent distress. HEENT: No cervical lymphadenopathy, unremarkable thyroid , sclerae anicteric, conjunctiva pink. RESPIRATORY: Respiratory effort normal, breath sounds clear and equal bilaterally without rales, rhonchi, or wheezing. CARDIO: Regular rate and rhythm with systolic murmur present, peripheral pulses intact. ABDOMEN: Soft, non-distended, active bowel sounds in all four quadrants, mild pain on palpation RLQ, no rebound, no mass appreciated. RECTAL: Declines. MUSCULOSKELETAL: Full range of motion, normal  gait, without edema. SKIN: Dry, intact without rashes or lesions. No jaundice. NEURO: Alert, oriented, no focal deficits. PSYCH: Cooperative, normal mood and affect.      Alan JONELLE Coombs, PA-C 11:49 AM

## 2023-08-21 ENCOUNTER — Encounter: Payer: Self-pay | Admitting: Physician Assistant

## 2023-08-21 ENCOUNTER — Ambulatory Visit (INDEPENDENT_AMBULATORY_CARE_PROVIDER_SITE_OTHER): Admitting: Physician Assistant

## 2023-08-21 VITALS — BP 136/72 | HR 56 | Ht 68.0 in | Wt 239.2 lb

## 2023-08-21 DIAGNOSIS — K602 Anal fissure, unspecified: Secondary | ICD-10-CM

## 2023-08-21 DIAGNOSIS — K625 Hemorrhage of anus and rectum: Secondary | ICD-10-CM

## 2023-08-21 DIAGNOSIS — Z860101 Personal history of adenomatous and serrated colon polyps: Secondary | ICD-10-CM | POA: Diagnosis not present

## 2023-08-21 DIAGNOSIS — R0609 Other forms of dyspnea: Secondary | ICD-10-CM

## 2023-08-21 DIAGNOSIS — K5904 Chronic idiopathic constipation: Secondary | ICD-10-CM

## 2023-08-21 DIAGNOSIS — G8929 Other chronic pain: Secondary | ICD-10-CM

## 2023-08-21 NOTE — Patient Instructions (Signed)
 Your provider has requested that you go to the basement level for lab work before leaving today. Press B on the elevator. The lab is located at the first door on the left as you exit the elevator.   Miralax is an osmotic laxative.  It only brings more water into the stool.  This is safe to take daily.  Can take up to 17 gram of miralax twice a day.  Mix with juice or coffee.  Start 1/2 capful daily for 3-4 days and reassess your response in 3-4 days.  You can increase and decrease the dose based on your response.  Remember, it can take up to 3-4 days to take effect OR for the effects to wear off.   I often pair this with benefiber in the morning to help assure the stool is not too loose.   FIBER SUPPLEMENT You can do metamucil or fibercon once or twice a day but if this causes gas/bloating please switch to Benefiber or Citracel.  Fiber is good for constipation/diarrhea/irritable bowel syndrome.  It can also help with weight loss and can help lower your bad cholesterol (LDL).  Please do 1 TBSP in the morning in water, coffee, or tea.  It can take up to a month before you can see a difference with your bowel movements.  It is cheapest from [employer], [employer], [employer].   You have been scheduled for a CT scan of the abdomen and pelvis at [facility], 1st floor Radiology. You are scheduled on 08/28/23 at 1:30pm. You should arrive 15 minutes prior to your appointment time for registration.    Please follow the written instructions below on the day of your exam:   1) Do not eat anything after 9:30am (4 hours prior to your test)    You may take any medications as prescribed with a small amount of water, if necessary. If you take any of the following medications: METFORMIN, GLUCOPHAGE, GLUCOVANCE, AVANDAMET, RIOMET, FORTAMET, ACTOPLUS MET, JANUMET, GLUMETZA or METAGLIP, you MAY be asked to HOLD this medication 48 hours AFTER the exam.   The purpose of you drinking the oral contrast is to  aid in the visualization of your intestinal tract. The contrast solution may cause some diarrhea. Depending on your individual set of symptoms, you may also receive an intravenous injection of x-ray contrast/dye. Plan on being at [facility] for 45 minutes or longer, depending on the type of exam you are having performed.   If you have any questions regarding your exam or if you need to reschedule, you may call [facility] at [phone] between the hours of 8:00 am and 5:00 pm, Monday-Friday.   _______________________________________________________  If your blood pressure at your visit was 140/90 or greater, please contact your primary care physician to follow up on this.  _______________________________________________________  If you are age 16 or older, your body mass index should be between 23-30. Your Body mass index is 36.38 kg/m. If this is out of the aforementioned range listed, please consider follow up with your Primary Care Provider.  If you are age 15 or younger, your body mass index should be between 19-25. Your Body mass index is 36.38 kg/m. If this is out of the aformentioned range listed, please consider follow up with your Primary Care Provider.   ________________________________________________________  The [facility] providers would like to encourage you to use MYCHART to communicate with providers for non-urgent requests or questions.  Due to long hold times on the telephone,  sending your provider a message by [facility] may be a faster and more efficient way to get a response.  Please allow 48 business hours for a response.  Please remember that this is for non-urgent requests.  _______________________________________________________  [facility] is using a team-based approach to care.  Your team is made up of your doctor and two to three APPS. Our APPS (Nurse Practitioners and Physician Assistants) work with your physician to ensure care continuity  for you. They are fully qualified to address your health concerns and develop a treatment plan. They communicate directly with your gastroenterologist to care for you. Seeing the Advanced Practice Practitioners on your physician's team can help you by facilitating care more promptly, often allowing for earlier appointments, access to diagnostic testing, procedures, and other specialty referrals.

## 2023-08-28 ENCOUNTER — Ambulatory Visit (HOSPITAL_COMMUNITY)
Admission: RE | Admit: 2023-08-28 | Discharge: 2023-08-28 | Disposition: A | Discharge status: Home / Self Care | Source: Ambulatory Visit | Attending: Physician Assistant | Admitting: Physician Assistant

## 2023-08-28 DIAGNOSIS — G8929 Other chronic pain: Secondary | ICD-10-CM | POA: Diagnosis not present

## 2023-08-28 DIAGNOSIS — I7143 Infrarenal abdominal aortic aneurysm, without rupture: Secondary | ICD-10-CM | POA: Diagnosis not present

## 2023-08-28 DIAGNOSIS — R1031 Right lower quadrant pain: Secondary | ICD-10-CM | POA: Diagnosis not present

## 2023-08-28 DIAGNOSIS — K5904 Chronic idiopathic constipation: Secondary | ICD-10-CM | POA: Diagnosis not present

## 2023-08-28 DIAGNOSIS — N400 Enlarged prostate without lower urinary tract symptoms: Secondary | ICD-10-CM | POA: Diagnosis not present

## 2023-08-28 DIAGNOSIS — K5730 Diverticulosis of large intestine without perforation or abscess without bleeding: Secondary | ICD-10-CM | POA: Diagnosis not present

## 2023-08-28 DIAGNOSIS — K5909 Other constipation: Secondary | ICD-10-CM | POA: Diagnosis not present

## 2023-08-28 MED ORDER — IOHEXOL 300 MG/ML  SOLN
100.0000 mL | Freq: Once | INTRAMUSCULAR | Status: AC | PRN
  Administered 2023-08-28: 100 mL via INTRAVENOUS

## 2023-08-31 ENCOUNTER — Ambulatory Visit: Payer: Self-pay | Admitting: Physician Assistant

## 2023-08-31 DIAGNOSIS — R935 Abnormal findings on diagnostic imaging of other abdominal regions, including retroperitoneum: Secondary | ICD-10-CM

## 2023-08-31 DIAGNOSIS — N400 Enlarged prostate without lower urinary tract symptoms: Secondary | ICD-10-CM

## 2023-09-26 DIAGNOSIS — R351 Nocturia: Secondary | ICD-10-CM | POA: Diagnosis not present

## 2023-09-26 DIAGNOSIS — N401 Enlarged prostate with lower urinary tract symptoms: Secondary | ICD-10-CM | POA: Diagnosis not present

## 2023-09-26 DIAGNOSIS — R9341 Abnormal radiologic findings on diagnostic imaging of renal pelvis, ureter, or bladder: Secondary | ICD-10-CM | POA: Diagnosis not present

## 2023-10-19 NOTE — Progress Notes (Deleted)
 10/19/2023 Raymond Morris [id number] December 04, 1931  Referring provider: Clarice Nottingham, MD Primary GI doctor: Dr. Charlanne  ASSESSMENT AND PLAN:  Chronic idiopathic constipation Hard stools, straining, every other day, started on miralax every other day and had Bm daily, no further bleeding Worsening symptoms since previous hemorrhoidectomy years ago 08/28/2023 CT abdomen pelvis colonic diverticulosis no diverticulitis enlarged prostate, 4.2 cm complex cystic structure dome of urinary bladder Remer present complex diverticulum versus urethral mass less likely, 4.5 cm infrarenal abdominal aortic aneurysm referred to urology. Labs not done still need CBC c-Met celiac and sed rate, thyroid  - Increase fiber/ water intake, decrease caffeine, increase activity level. -Will get CT AB and pelvis with contrast - check TSH celiac, ESR -Will add on Miralax daily and Benefiber -possible component of pelvis floor dysfunction with history and symptoms. Consider pelvic floor - follow up 2- 3 months  Rectal bleeding with fissure from straining Had BRB intermittent with straining for last 10-15 years, in toilet and on TP, no rectal pain, itching or burning Treated with miralax last month and symptoms have resolved Declines rectal exam, no anemia Consider exam if symptoms reoccur  Personal history of polyps Colonoscopy 08/2015 Dr. Rollin good prep 8 TA polyps 2 to 5 mm transverse colon hepatic flexure ascending colon diverticulosis sigmoid colon  Consider repeat colonoscopy if any worsening symptoms or symptoms do not improve  IgG kappa monoclonal gammopathy of undetermined significance Follows with Dr. Federico in hematology last seen 07/31/2023  Infrarenal abdominal aortic aneurysm Follows with Dr. Magda vascular  CVA noted on MRI 2002 Telemetry no A-fib 2022 Echo 2021 EF 55 to 60% grade 1 diastolic dysfunction no aortic stenosis  DOE  Worse with exertion, some mucus, cough, no chest pain with  exertion, no leg swelling No smoking history Consider repeat CXR, lungs CTAB on examination Possibly deconditioning  Patient Care Team: Clarice Nottingham, MD as PCP - General (Internal Medicine) Loni Soyla LABOR, MD as PCP - Cardiology (Cardiology) Blanca Elsie RAMAN, MD (Cardiology) Nicholaus Tanda LITTIE DOUGLAS, MD as Attending Physician (Urology) Early, Krystal FALCON, MD (Inactive) as Consulting Physician (Vascular Surgery)  HISTORY OF PRESENT ILLNESS: 89 y.o. male with a past medical history listed below presents as a new patient for evaluation of constipation.   I last saw the patient in the office 08/21/2023 for constipation and abdominal pain.  Discussed the use of AI scribe software for clinical note transcription with the patient, who gave verbal consent to proceed.  History of Present Illness          He  reports that he quit smoking about 47 years ago. His smoking use included cigarettes. He started smoking about 75 years ago. He has a 14 pack-year smoking history. He has never used smokeless tobacco. He reports that he does not drink alcohol and does not use drugs.  RELEVANT GI HISTORY, IMAGING AND LABS: Results          CBC    Component Value Date/Time   WBC 5.4 07/25/2023 1107   WBC 11.7 (H) 11/04/2021 1430   RBC 4.36 07/25/2023 1107   HGB 13.7 07/25/2023 1107   HCT 40.7 07/25/2023 1107   PLT 164 07/25/2023 1107   MCV 93.3 07/25/2023 1107   MCH 31.4 07/25/2023 1107   MCHC 33.7 07/25/2023 1107   RDW 13.1 07/25/2023 1107   LYMPHSABS 1.4 07/25/2023 1107   MONOABS 0.6 07/25/2023 1107   EOSABS 0.2 07/25/2023 1107   BASOSABS 0.1 07/25/2023 1107   Recent  Labs    01/12/23 1016 07/25/23 1107  HGB 14.5 13.7    CMP     Component Value Date/Time   NA 137 07/25/2023 1107   K 3.7 07/25/2023 1107   CL 102 07/25/2023 1107   CO2 31 07/25/2023 1107   GLUCOSE 99 07/25/2023 1107   BUN 14 07/25/2023 1107   CREATININE 0.90 07/25/2023 1107   CALCIUM  9.1 07/25/2023 1107   PROT 6.1  (L) 07/25/2023 1107   ALBUMIN 3.6 07/25/2023 1107   AST 16 07/25/2023 1107   ALT 14 07/25/2023 1107   ALKPHOS 47 07/25/2023 1107   BILITOT 0.9 07/25/2023 1107   GFRNONAA >60 07/25/2023 1107   GFRAA 73 (L) 10/10/2013 0500      Latest Ref Rng & Units 07/25/2023   11:07 AM 01/12/2023   10:16 AM 07/14/2022   10:31 AM  Hepatic Function  Total Protein 6.5 - 8.1 g/dL 6.1  6.2  6.5   Albumin 3.5 - 5.0 g/dL 3.6  3.8  4.0   AST 15 - 41 U/L 16  18  18    ALT 0 - 44 U/L 14  14  16    Alk Phosphatase 38 - 126 U/L 47  48  44   Total Bilirubin 0.0 - 1.2 mg/dL 0.9  0.8  0.8       Current Medications:    Current Outpatient Medications (Cardiovascular):    carvedilol (COREG) 12.5 MG tablet, Take 12.5 mg by mouth 2 (two) times daily with a meal.   furosemide (LASIX) 20 MG tablet,    nitroGLYCERIN (NITROSTAT) 0.4 MG SL tablet, Place 0.4 mg under the tongue every 5 (five) minutes as needed for chest pain.   olmesartan (BENICAR) 20 MG tablet, Take 20 mg by mouth daily.   rosuvastatin  (CRESTOR ) 20 MG tablet, Take 10 mg by mouth daily.  Current Outpatient Medications (Respiratory):    albuterol  (VENTOLIN  HFA) 108 (90 Base) MCG/ACT inhaler, Inhale 2 puffs into the lungs every 6 (six) hours as needed for wheezing or shortness of breath.   cetirizine (ZYRTEC) 10 MG tablet, Take 10 mg by mouth daily.   fluticasone -salmeterol (WIXELA INHUB) 250-50 MCG/ACT AEPB, Inhale 1 puff into the lungs in the morning and at bedtime.  Current Outpatient Medications (Analgesics):    aspirin  EC 81 MG tablet, Take 81 mg by mouth daily.   Current Outpatient Medications (Other):    escitalopram  (LEXAPRO ) 20 MG tablet, Take 20 mg by mouth daily. 1/2 -1 tablet once daily   esomeprazole (NEXIUM) 20 MG capsule, Take 20 mg by mouth daily before breakfast.   finasteride  (PROSCAR ) 5 MG tablet, Take 1 tablet (5 mg total) by mouth daily.   potassium chloride  (KLOR-CON ) 10 MEQ tablet, Take 10 mEq by mouth daily.   Tamsulosin  HCl  (FLOMAX ) 0.4 MG CAPS, Take 0.4 mg by mouth daily.  Medical History:  Past Medical History:  Diagnosis Date   AAA (abdominal aortic aneurysm)    Anxiety    Arthritis    thumbs; hands (10/08/2013)   Asthma    Carotid artery occlusion    Cellulitis of right foot 10/02/2013   Chronic bronchitis (HCC)    just about q yr   Chronic lower back pain    COPD (chronic obstructive pulmonary disease) (HCC)    CVA (cerebral infarction)    per MRA done 2002   Depression    Diverticulosis    Edema    chronic, lower extremities   GERD (gastroesophageal reflux disease)  Gout    11/2013 surgeries   Hiatal hernia    Hypercholesterolemia    Hyperlipidemia    Hypertension    Monoclonal gammopathy    DR Timmy, 2007,felt to be benign   Phlebitis    right leg   Pneumonia 9 times   (10/08/2013)   Stroke (HCC) 01/03/2000   didn't know I'd had one; just told me about it in 2015   Upper respiratory infection    I've had lots   Urinary frequency    elevated PSA , with prostate nodule, Dr [id number]   Allergies:  Allergies  Allergen Reactions   Ceclor [Cefaclor] Other (See Comments)    Causes muscle spasms   Bactrim [Sulfamethoxazole-Trimethoprim]    Spironolactone     Other reaction(s): gynecomastia   Sulfa Antibiotics Rash     Surgical History:  He  has a past surgical history that includes Appendectomy; Umbilical hernia repair; Excisional hemorrhoidectomy; Vasectomy; Hernia repair; and Colonoscopy (N/A, 08/13/2015). Family History:  His family history includes Cancer in his father and mother; Cerebral palsy in his son; Liver disease in his sister; Pneumonia in his daughter; Stroke in his sister.  REVIEW OF SYSTEMS  : All other systems reviewed and negative except where noted in the History of Present Illness.  PHYSICAL EXAM: There were no vitals taken for this visit. Physical Exam   GENERAL APPEARANCE: Obese, elderly in no apparent distress. HEENT: No cervical  lymphadenopathy, unremarkable thyroid , sclerae anicteric, conjunctiva pink. RESPIRATORY: Respiratory effort normal, breath sounds clear and equal bilaterally without rales, rhonchi, or wheezing. CARDIO: Regular rate and rhythm with systolic murmur present, peripheral pulses intact. ABDOMEN: Soft, non-distended, active bowel sounds in all four quadrants, mild pain on palpation RLQ, no rebound, no mass appreciated. RECTAL: Declines. MUSCULOSKELETAL: Full range of motion, normal gait, without edema. SKIN: Dry, intact without rashes or lesions. No jaundice. NEURO: Alert, oriented, no focal deficits. PSYCH: Cooperative, normal mood and affect.      Alan JONELLE Coombs, PA-C 1:24 PM

## 2023-10-22 ENCOUNTER — Ambulatory Visit: Admitting: Physician Assistant

## 2023-10-29 ENCOUNTER — Ambulatory Visit: Admitting: Cardiology

## 2023-11-07 DIAGNOSIS — L570 Actinic keratosis: Secondary | ICD-10-CM | POA: Diagnosis not present

## 2023-11-07 DIAGNOSIS — C4441 Basal cell carcinoma of skin of scalp and neck: Secondary | ICD-10-CM | POA: Diagnosis not present

## 2023-11-07 DIAGNOSIS — X32XXXD Exposure to sunlight, subsequent encounter: Secondary | ICD-10-CM | POA: Diagnosis not present

## 2023-11-07 DIAGNOSIS — B078 Other viral warts: Secondary | ICD-10-CM | POA: Diagnosis not present

## 2023-11-09 ENCOUNTER — Encounter: Payer: Self-pay | Admitting: Cardiology

## 2023-11-09 ENCOUNTER — Ambulatory Visit: Attending: Cardiology | Admitting: Cardiology

## 2023-11-15 DIAGNOSIS — E7800 Pure hypercholesterolemia, unspecified: Secondary | ICD-10-CM | POA: Diagnosis not present

## 2023-11-15 DIAGNOSIS — I10 Essential (primary) hypertension: Secondary | ICD-10-CM | POA: Diagnosis not present

## 2023-11-15 DIAGNOSIS — Z8673 Personal history of transient ischemic attack (TIA), and cerebral infarction without residual deficits: Secondary | ICD-10-CM | POA: Diagnosis not present

## 2023-11-15 DIAGNOSIS — R7303 Prediabetes: Secondary | ICD-10-CM | POA: Diagnosis not present

## 2023-11-22 DIAGNOSIS — R600 Localized edema: Secondary | ICD-10-CM | POA: Diagnosis not present

## 2023-11-22 DIAGNOSIS — I10 Essential (primary) hypertension: Secondary | ICD-10-CM | POA: Diagnosis not present

## 2023-11-22 DIAGNOSIS — R7303 Prediabetes: Secondary | ICD-10-CM | POA: Diagnosis not present

## 2023-11-22 DIAGNOSIS — R0602 Shortness of breath: Secondary | ICD-10-CM | POA: Diagnosis not present

## 2023-11-22 DIAGNOSIS — Z8673 Personal history of transient ischemic attack (TIA), and cerebral infarction without residual deficits: Secondary | ICD-10-CM | POA: Diagnosis not present

## 2023-11-22 DIAGNOSIS — E7800 Pure hypercholesterolemia, unspecified: Secondary | ICD-10-CM | POA: Diagnosis not present

## 2023-11-27 ENCOUNTER — Ambulatory Visit: Admission: EM | Admit: 2023-11-27 | Discharge: 2023-11-27 | Disposition: A | Discharge status: Home / Self Care

## 2023-11-27 ENCOUNTER — Other Ambulatory Visit: Payer: Self-pay

## 2023-11-27 ENCOUNTER — Ambulatory Visit (INDEPENDENT_AMBULATORY_CARE_PROVIDER_SITE_OTHER)

## 2023-11-27 ENCOUNTER — Encounter: Payer: Self-pay | Admitting: Emergency Medicine

## 2023-11-27 DIAGNOSIS — J069 Acute upper respiratory infection, unspecified: Secondary | ICD-10-CM

## 2023-11-27 DIAGNOSIS — J029 Acute pharyngitis, unspecified: Secondary | ICD-10-CM | POA: Diagnosis not present

## 2023-11-27 DIAGNOSIS — J210 Acute bronchiolitis due to respiratory syncytial virus: Secondary | ICD-10-CM | POA: Diagnosis not present

## 2023-11-27 DIAGNOSIS — R059 Cough, unspecified: Secondary | ICD-10-CM | POA: Diagnosis not present

## 2023-11-27 DIAGNOSIS — R0989 Other specified symptoms and signs involving the circulatory and respiratory systems: Secondary | ICD-10-CM | POA: Diagnosis not present

## 2023-11-27 LAB — POCT RESPIRATORY SYNCYTIAL VIRUS: RSV Rapid Ag: POSITIVE

## 2023-11-27 LAB — POC COVID19/FLU A&B COMBO
Covid Antigen, POC: NEGATIVE
Influenza A Antigen, POC: NEGATIVE
Influenza B Antigen, POC: NEGATIVE

## 2023-11-27 MED ORDER — BENZONATATE 100 MG PO CAPS: 100.0000 mg | ORAL_CAPSULE | Freq: Three times a day (TID) | ORAL | 0 refills | Status: DC

## 2023-11-27 MED ORDER — ALBUTEROL SULFATE HFA 108 (90 BASE) MCG/ACT IN AERS: 2.0000 | INHALATION_SPRAY | Freq: Four times a day (QID) | RESPIRATORY_TRACT | 0 refills | Status: AC | PRN

## 2023-11-27 MED ORDER — METHYLPREDNISOLONE 4 MG PO TBPK: ORAL_TABLET | ORAL | 0 refills | Status: DC

## 2023-11-27 MED ORDER — GUAIFENESIN ER 600 MG PO TB12: 600.0000 mg | ORAL_TABLET | Freq: Two times a day (BID) | ORAL | 0 refills | Status: AC

## 2023-11-27 NOTE — ED Provider Notes (Signed)
 [facility]    CSN: [id number] Arrival date & time: 11/27/23  1715      History   Chief Complaint Chief Complaint  Patient presents with   Cough    HPI Raymond Morris is a 89 y.o. male.   Pt presents today due to sudden onset of cough productive of yellow green sputum, throat pain, nasal congestion that started yesterday. Pt states that he has not taken anything for symptoms. Pt has a hx of COPD and states that his symptoms usually turn in to pneumonia pretty fast. Pt is asking for a zpack because that is what he usually get with these symptoms.   The history is provided by the patient.  Cough   Past Medical History:  Diagnosis Date   AAA (abdominal aortic aneurysm)    Anxiety    Arthritis    thumbs; hands (10/08/2013)   Asthma    Carotid artery occlusion    Cellulitis of right foot 10/02/2013   Chronic bronchitis (HCC)    just about q yr   Chronic lower back pain    COPD (chronic obstructive pulmonary disease) (HCC)    CVA (cerebral infarction)    per MRA done 2002   Depression    Diverticulosis    Edema    chronic, lower extremities   GERD (gastroesophageal reflux disease)    Gout    11/2013 surgeries   Hiatal hernia    Hypercholesterolemia    Hyperlipidemia    Hypertension    Monoclonal gammopathy    DR Timmy, 2007,felt to be benign   Phlebitis    right leg   Pneumonia 9 times   (10/08/2013)   Stroke (HCC) 01/03/2000   didn't know I'd had one; just told me about it in 2015   Upper respiratory infection    I've had lots   Urinary frequency    elevated PSA , with prostate nodule, [id number]    Patient Active Problem List   Diagnosis Date Noted   MGUS (monoclonal gammopathy of unknown significance) 01/18/2023   Cellulitis 10/08/2013   Cellulitis of foot, right 10/08/2013   Hypokalemia 10/08/2013   Essential hypertension, benign 10/08/2013   Hyperlipidemia 10/08/2013   GERD (gastroesophageal reflux disease) 10/08/2013    Aneurysm of abdominal vessel 11/10/2010    Past Surgical History:  Procedure Laterality Date   APPENDECTOMY     COLONOSCOPY N/A 08/13/2015   Procedure: COLONOSCOPY;  Surgeon: Belvie Just, MD;  Location: [facility];  Service: Endoscopy;  Laterality: N/A;   EXCISIONAL HEMORRHOIDECTOMY     HERNIA REPAIR     UMBILICAL HERNIA REPAIR     VASECTOMY         Home Medications    Prior to Admission medications   Medication Sig Start Date End Date Taking? Authorizing Provider  benzonatate  (TESSALON ) 100 MG capsule Take 1 capsule (100 mg total) by mouth every 8 (eight) hours. 11/27/23  Yes Andra Corean BROCKS, PA-C  guaiFENesin  (MUCINEX ) 600 MG 12 hr tablet Take 1 tablet (600 mg total) by mouth 2 (two) times daily for 10 days. 11/27/23 12/07/23 Yes Andra Corean BROCKS, PA-C  methylPREDNISolone  (MEDROL  DOSEPAK) 4 MG TBPK tablet Take as directed on back of package 11/27/23  Yes Andra Corean BROCKS, PA-C  albuterol  (VENTOLIN  HFA) 108 (90 Base) MCG/ACT inhaler Inhale 2 puffs into the lungs every 6 (six) hours as needed for wheezing or shortness of breath. 11/27/23   Andra Corean BROCKS, PA-C  aspirin  EC 81 MG tablet Take  81 mg by mouth daily.    [provider]  carvedilol (COREG) 12.5 MG tablet Take 12.5 mg by mouth 2 (two) times daily with a meal.    [provider]  cetirizine (ZYRTEC) 10 MG tablet Take 10 mg by mouth daily.    [provider]  escitalopram  (LEXAPRO ) 20 MG tablet Take 20 mg by mouth daily. 1/2 -1 tablet once daily    [provider]  esomeprazole (NEXIUM) 20 MG capsule Take 20 mg by mouth daily before breakfast.    [provider]  finasteride  (PROSCAR ) 5 MG tablet Take 1 tablet (5 mg total) by mouth daily. 10/21/20   Maranda Jamee Jacob, MD  fluticasone -salmeterol (WIXELA INHUB) 250-50 MCG/ACT AEPB Inhale 1 puff into the lungs in the morning and at bedtime. 04/13/22   Gladis Leonor HERO, MD  furosemide (LASIX) 20 MG tablet   06/04/20   [provider]  nitroGLYCERIN (NITROSTAT) 0.4 MG SL tablet Place 0.4 mg under the tongue every 5 (five) minutes as needed for chest pain.    [provider]  olmesartan (BENICAR) 20 MG tablet Take 20 mg by mouth daily.    [provider]  potassium chloride  (KLOR-CON ) 10 MEQ tablet Take 10 mEq by mouth daily. 11/28/21   [provider]  rosuvastatin  (CRESTOR ) 20 MG tablet Take 10 mg by mouth daily.    [provider]  Tamsulosin  HCl (FLOMAX ) 0.4 MG CAPS Take 0.4 mg by mouth daily.    [provider]    Family History Family History  Problem Relation Age of Onset   Cancer Mother    Cancer Father    Stroke Sister    Liver disease Sister    Pneumonia Daughter        pnemoccoal, deceased   Cerebral palsy Son     Social History Social History   Tobacco Use   Smoking status: Former    Current packs/day: 0.00    Average packs/day: 0.5 packs/day for 28.0 years (14.0 ttl pk-yrs)    Types: Cigarettes    Start date: 09/07/1948    Quit date: 09/07/1976    Years since quitting: 47.2   Smokeless tobacco: Never  Vaping Use   Vaping status: Never Used  Substance Use Topics   Alcohol use: No    Alcohol/week: 0.0 standard drinks of alcohol   Drug use: No     Allergies   Ceclor [cefaclor], Bactrim [sulfamethoxazole-trimethoprim], Spironolactone, and Sulfa antibiotics   Review of Systems Review of Systems  Respiratory:  Positive for cough.      Physical Exam Triage Vital Signs ED Triage Vitals [11/27/23 1732]  Encounter Vitals Group     BP (!) 148/80     Girls Systolic BP Percentile      Girls Diastolic BP Percentile      Boys Systolic BP Percentile      Boys Diastolic BP Percentile      Pulse Rate 72     Resp 18     Temp 99.4 F (37.4 C)     Temp Source Oral     SpO2 96 %     Weight      Height      Head Circumference      Peak Flow      Pain Score 5     Pain Loc      Pain Education      Exclude from  Growth Chart    No data found.  Updated  Vital Signs BP (!) 148/80 (BP Location: Left Arm)   Pulse 72   Temp 99.4 F (37.4 C) (Oral)   Resp 18   SpO2 96%   Visual Acuity Right Eye Distance:   Left Eye Distance:   Bilateral Distance:    Right Eye Near:   Left Eye Near:    Bilateral Near:     Physical Exam Vitals and nursing note reviewed.  Constitutional:      General: He is not in acute distress.    Appearance: Normal appearance. He is not ill-appearing, toxic-appearing or diaphoretic.  HENT:     Nose: Congestion (moderately enlarged turbinates) present. No rhinorrhea.     Mouth/Throat:     Mouth: Mucous membranes are moist.     Pharynx: Oropharynx is clear. No oropharyngeal exudate or posterior oropharyngeal erythema.  Eyes:     General: No scleral icterus. Cardiovascular:     Rate and Rhythm: Normal rate and regular rhythm.     Heart sounds: Normal heart sounds.  Pulmonary:     Effort: Pulmonary effort is normal. No respiratory distress.     Breath sounds: Normal breath sounds. No wheezing or rhonchi.  Skin:    General: Skin is warm.  Neurological:     Mental Status: He is alert and oriented to person, place, and time.  Psychiatric:        Mood and Affect: Mood normal.        Behavior: Behavior normal.      UC Treatments / Results  Labs (all labs ordered are listed, but only abnormal results are displayed) Labs Reviewed  POC COVID19/FLU A&B COMBO - Normal  POCT RESPIRATORY SYNCYTIAL VIRUS    EKG   Radiology DG Chest 2 View Result Date: 11/27/2023 CLINICAL DATA:  Cough, congestion, sore throat EXAM: CHEST - 2 VIEW COMPARISON:  12/21/2021 FINDINGS: Frontal and lateral views of the chest demonstrate an unremarkable cardiac silhouette. No acute airspace disease, effusion, or pneumothorax. Stable bibasilar scarring. No acute bony abnormalities. IMPRESSION: 1. No acute intrathoracic process. Electronically Signed   By: Ozell Daring M.D.   On: 11/27/2023  18:27    Procedures Procedures (including critical care time)  Medications Ordered in UC Medications - No data to display  Initial Impression / Assessment and Plan / UC Course  I have reviewed the triage vital signs and the nursing notes.  Pertinent labs & imaging results that were available during my care of the patient were reviewed by me and considered in my medical decision making (see chart for details).     Final Clinical Impressions(s) / UC Diagnoses   Final diagnoses:  Viral URI  Acute bronchiolitis due to respiratory syncytial virus (RSV)   Discharge Instructions   None    ED Prescriptions     Medication Sig Dispense Auth. Provider   albuterol  (VENTOLIN  HFA) 108 (90 Base) MCG/ACT inhaler Inhale 2 puffs into the lungs every 6 (six) hours as needed for wheezing or shortness of breath. 8 g Andra Krabbe C, PA-C   benzonatate  (TESSALON ) 100 MG capsule Take 1 capsule (100 mg total) by mouth every 8 (eight) hours. 30 capsule Andra Krabbe C, PA-C   methylPREDNISolone  (MEDROL  DOSEPAK) 4 MG TBPK tablet Take as directed on back of package 21 tablet Andra Krabbe C, PA-C   guaiFENesin  (MUCINEX ) 600 MG 12 hr tablet Take 1 tablet (600 mg total) by mouth 2 (two) times daily for 10 days. 20 tablet Andra Krabbe BROCKS, PA-C  PDMP not reviewed this encounter.   Andra Corean BROCKS, PA-C 11/27/23 1840

## 2023-11-27 NOTE — ED Triage Notes (Signed)
 Pt here for cough, congestion and sore throat x 2 days

## 2023-12-05 DIAGNOSIS — Z7951 Long term (current) use of inhaled steroids: Secondary | ICD-10-CM | POA: Diagnosis not present

## 2023-12-05 DIAGNOSIS — J449 Chronic obstructive pulmonary disease, unspecified: Secondary | ICD-10-CM | POA: Insufficient documentation

## 2023-12-05 DIAGNOSIS — Z8673 Personal history of transient ischemic attack (TIA), and cerebral infarction without residual deficits: Secondary | ICD-10-CM | POA: Insufficient documentation

## 2023-12-05 DIAGNOSIS — R051 Acute cough: Secondary | ICD-10-CM | POA: Insufficient documentation

## 2023-12-05 DIAGNOSIS — Z79899 Other long term (current) drug therapy: Secondary | ICD-10-CM | POA: Diagnosis not present

## 2023-12-05 DIAGNOSIS — I10 Essential (primary) hypertension: Secondary | ICD-10-CM | POA: Insufficient documentation

## 2023-12-05 DIAGNOSIS — Z7982 Long term (current) use of aspirin: Secondary | ICD-10-CM | POA: Diagnosis not present

## 2023-12-05 NOTE — ED Triage Notes (Signed)
 POV/ ambulatory/ dx with RSV x9 days ago/ pt has injured left side of ribs d/t constant coughing/ A&OX4

## 2023-12-06 ENCOUNTER — Emergency Department (HOSPITAL_COMMUNITY)

## 2023-12-06 ENCOUNTER — Encounter (HOSPITAL_COMMUNITY): Payer: Self-pay | Admitting: *Deleted

## 2023-12-06 ENCOUNTER — Other Ambulatory Visit: Payer: Self-pay

## 2023-12-06 ENCOUNTER — Emergency Department (HOSPITAL_COMMUNITY)
Admission: EM | Admit: 2023-12-06 | Discharge: 2023-12-06 | Disposition: A | Attending: Emergency Medicine | Admitting: Emergency Medicine

## 2023-12-06 DIAGNOSIS — I700 Atherosclerosis of aorta: Secondary | ICD-10-CM | POA: Diagnosis not present

## 2023-12-06 DIAGNOSIS — J9811 Atelectasis: Secondary | ICD-10-CM | POA: Diagnosis not present

## 2023-12-06 DIAGNOSIS — R0781 Pleurodynia: Secondary | ICD-10-CM | POA: Diagnosis not present

## 2023-12-06 DIAGNOSIS — R051 Acute cough: Secondary | ICD-10-CM

## 2023-12-06 DIAGNOSIS — R059 Cough, unspecified: Secondary | ICD-10-CM | POA: Diagnosis not present

## 2023-12-06 MED ORDER — DICLOFENAC SODIUM ER 100 MG PO TB24: 100.0000 mg | ORAL_TABLET | Freq: Every day | ORAL | 0 refills | Status: DC

## 2023-12-06 MED ORDER — LIDOCAINE 5 % EX PTCH: 1.0000 | MEDICATED_PATCH | CUTANEOUS | 0 refills | Status: DC

## 2023-12-06 MED ORDER — LIDOCAINE 5 % EX PTCH
2.0000 | MEDICATED_PATCH | CUTANEOUS | Status: DC
  Administered 2023-12-06: 2 via TRANSDERMAL
  Filled 2023-12-06: qty 2

## 2023-12-06 MED ORDER — ACETAMINOPHEN 500 MG PO TABS
1000.0000 mg | ORAL_TABLET | Freq: Once | ORAL | Status: AC
  Administered 2023-12-06: 1000 mg via ORAL
  Filled 2023-12-06: qty 2

## 2023-12-06 MED ORDER — KETOROLAC TROMETHAMINE 60 MG/2ML IM SOLN
30.0000 mg | Freq: Once | INTRAMUSCULAR | Status: AC
  Administered 2023-12-06: 30 mg via INTRAMUSCULAR
  Filled 2023-12-06: qty 2

## 2023-12-06 NOTE — ED Provider Notes (Signed)
 [place] [facility] AT [facility] Provider Note   CSN: [id number] Arrival date & time: 12/05/23  2355     Patient presents with: Rib Injury   Raymond Morris is a 89 y.o. male.   The history is provided by the patient.  Cough Cough characteristics:  Non-productive Severity:  Moderate Onset quality:  Gradual Duration:  9 days Timing:  Intermittent Progression:  Unchanged Chronicity:  New Context: upper respiratory infection   Context comment:  RSV positive Relieved by:  Cough suppressants and decongestant Worsened by:  Nothing Associated symptoms: no chest pain, no chills, no diaphoresis, no shortness of breath, no sore throat and no wheezing   Associated symptoms comment:  Rib pain with coughing  COPD with RSV with posterior rib pain with coughing.  Has not taken any pain medication.      Past Medical History:  Diagnosis Date   AAA (abdominal aortic aneurysm)    Anxiety    Arthritis    thumbs; hands (10/08/2013)   Asthma    Carotid artery occlusion    Cellulitis of right foot 10/02/2013   Chronic bronchitis (HCC)    just about q yr   Chronic lower back pain    COPD (chronic obstructive pulmonary disease) (HCC)    CVA (cerebral infarction)    per MRA done 2002   Depression    Diverticulosis    Edema    chronic, lower extremities   GERD (gastroesophageal reflux disease)    Gout    11/2013 surgeries   Hiatal hernia    Hypercholesterolemia    Hyperlipidemia    Hypertension    Monoclonal gammopathy    DR Timmy, 2007,felt to be benign   Phlebitis    right leg   Pneumonia 9 times   (10/08/2013)   Stroke (HCC) 01/03/2000   didn't know I'd had one; just told me about it in 2015   Upper respiratory infection    I've had lots   Urinary frequency    elevated PSA , with prostate nodule, Dr [id number]     Prior to Admission medications   Medication Sig Start Date End Date Taking? Authorizing Provider  Diclofenac  Sodium CR  100 MG 24 hr tablet Take 1 tablet (100 mg total) by mouth daily. 12/06/23  Yes Brenon Antosh, MD  lidocaine  (LIDODERM ) 5 % Place 1 patch onto the skin daily. Remove & Discard patch within 12 hours or as directed by MD 12/06/23  Yes Christal Lagerstrom, MD  albuterol  (VENTOLIN  HFA) 108 (90 Base) MCG/ACT inhaler Inhale 2 puffs into the lungs every 6 (six) hours as needed for wheezing or shortness of breath. 11/27/23   Andra Corean BROCKS, PA-C  aspirin  EC 81 MG tablet Take 81 mg by mouth daily.    [provider]  benzonatate  (TESSALON ) 100 MG capsule Take 1 capsule (100 mg total) by mouth every 8 (eight) hours. 11/27/23   Andra Corean BROCKS, PA-C  carvedilol (COREG) 12.5 MG tablet Take 12.5 mg by mouth 2 (two) times daily with a meal.    [provider]  cetirizine (ZYRTEC) 10 MG tablet Take 10 mg by mouth daily.    [provider]  escitalopram  (LEXAPRO ) 20 MG tablet Take 20 mg by mouth daily. 1/2 -1 tablet once daily    [provider]  esomeprazole (NEXIUM) 20 MG capsule Take 20 mg by mouth daily before breakfast.    [provider]  finasteride  (PROSCAR ) 5 MG tablet Take 1 tablet (5  mg total) by mouth daily. 10/21/20   Maranda Jamee Jacob, MD  fluticasone -salmeterol (WIXELA INHUB) 250-50 MCG/ACT AEPB Inhale 1 puff into the lungs in the morning and at bedtime. 04/13/22   Gladis Leonor HERO, MD  furosemide (LASIX) 20 MG tablet  06/04/20   [provider]  guaiFENesin  (MUCINEX ) 600 MG 12 hr tablet Take 1 tablet (600 mg total) by mouth 2 (two) times daily for 10 days. 11/27/23 12/07/23  Andra Corean BROCKS, PA-C  methylPREDNISolone  (MEDROL  DOSEPAK) 4 MG TBPK tablet Take as directed on back of package 11/27/23   Andra Corean BROCKS, PA-C  nitroGLYCERIN (NITROSTAT) 0.4 MG SL tablet Place 0.4 mg under the tongue every 5 (five) minutes as needed for chest pain.    [provider]  olmesartan (BENICAR) 20 MG tablet Take 20 mg by mouth daily.     [provider]  potassium chloride  (KLOR-CON ) 10 MEQ tablet Take 10 mEq by mouth daily. 11/28/21   [provider]  rosuvastatin  (CRESTOR ) 20 MG tablet Take 10 mg by mouth daily.    [provider]  Tamsulosin  HCl (FLOMAX ) 0.4 MG CAPS Take 0.4 mg by mouth daily.    [provider]    Allergies: Ceclor [cefaclor], Bactrim [sulfamethoxazole-trimethoprim], Spironolactone, and Sulfa antibiotics    Review of Systems  Constitutional:  Negative for chills and diaphoresis.  HENT:  Negative for sore throat.   Respiratory:  Positive for cough. Negative for shortness of breath, wheezing and stridor.   Cardiovascular:  Negative for chest pain, palpitations and leg swelling.  All other systems reviewed and are negative.   Updated Vital Signs BP 137/69 (BP Location: Right Arm)   Pulse (!) 58   Temp 97.8 F (36.6 C)   Resp 18   Ht 5' 8 (1.727 m)   Wt 108.5 kg   SpO2 96%   BMI 36.37 kg/m   Physical Exam Vitals and nursing note reviewed.  Constitutional:      General: He is not in acute distress.    Appearance: Normal appearance. He is well-developed. He is not diaphoretic.  HENT:     Head: Normocephalic and atraumatic.     Nose: Nose normal.  Eyes:     Conjunctiva/sclera: Conjunctivae normal.     Pupils: Pupils are equal, round, and reactive to light.  Cardiovascular:     Rate and Rhythm: Normal rate and regular rhythm.     Pulses: Normal pulses.     Heart sounds: Normal heart sounds.  Pulmonary:     Effort: Pulmonary effort is normal. No respiratory distress.     Breath sounds: Normal breath sounds. No stridor. No wheezing, rhonchi or rales.  Chest:     Chest wall: No tenderness.  Abdominal:     General: Bowel sounds are normal.     Palpations: Abdomen is soft.     Tenderness: There is no abdominal tenderness. There is no guarding or rebound.  Musculoskeletal:        General: No tenderness. Normal range of motion.     Cervical back: Normal  range of motion and neck supple.     Right lower leg: No edema.     Left lower leg: No edema.     Comments: Negative Homan sign   Skin:    General: Skin is warm and dry.     Capillary Refill: Capillary refill takes less than 2 seconds.  Neurological:     General: No focal deficit present.     Mental Status:  He is alert and oriented to person, place, and time.     Deep Tendon Reflexes: Reflexes normal.     (all labs ordered are listed, but only abnormal results are displayed) Labs Reviewed - No data to display  EKG: None  Radiology: DG Ribs Unilateral W/Chest Left Result Date: 12/06/2023 EXAM: AP VIEW XRAY OF THE LEFT RIBS AND CHEST 12/06/2023 01:14:35 AM COMPARISON: None available. CLINICAL HISTORY: The patient has RSV for 9 days. He has been coughing and he has had sharp pain in his left lateral ribs for 2 days. FINDINGS: BONES: No acute displaced rib fracture. LUNGS AND PLEURA: Left lung base atelectasis. No pleural effusion or pneumothorax. HEART AND MEDIASTINUM: No acute abnormality of the cardiac and mediastinal silhouettes. Aortic calcification. IMPRESSION: 1. Left lateral ribs without acute fracture. 2. Left lung base atelectasis. Electronically signed by: Franky Stanford MD 12/06/2023 01:40 AM EST RP Workstation: [id number]     Procedures   Medications Ordered in the ED  lidocaine (LIDODERM) 5 % 2 patch (2 patches Transdermal Patch Applied 12/06/23 0352)  ketorolac  (TORADOL ) injection 30 mg (30 mg Intramuscular Given 12/06/23 0352)  acetaminophen  (TYLENOL ) tablet 1,000 mg (1,000 mg Oral Given 12/06/23 0351)                                    Medical Decision Making Cough with RSV and pain with cough   Amount and/or Complexity of Data Reviewed Independent Historian: caregiver    Details: See above  External Data Reviewed: labs and notes.    Details: Previous urgent care visit reviewed  Radiology: ordered and independent interpretation performed.    Details: No  PTX  Risk OTC drugs. Prescription drug management. Risk Details: Patient is well appearing.  Pain with coughing.  Muscle strain as some tenderness in the back at the area in question.  No pleuritic pain no leg pain.  I do not believe this is a PE at this time.  Patient has not taken any pain medication for his symptoms.  Is feeling markedly improved post medication.  Stable for discharge with close follow up.  Strict returns.      Final diagnoses:  Acute cough   No signs of systemic illness or infection. The patient is nontoxic-appearing on exam and vital signs are within normal limits.  I have reviewed the triage vital signs and the nursing notes. Pertinent labs & imaging results that were available during my care of the patient were reviewed by me and considered in my medical decision making (see chart for details). After history, exam, and medical workup I feel the patient has been appropriately medically screened and is safe for discharge home. Pertinent diagnoses were discussed with the patient. Patient was given return precautions.    ED Discharge Orders          Ordered    lidocaine (LIDODERM) 5 %  Every 24 hours        12/06/23 0417    Diclofenac Sodium CR 100 MG 24 hr tablet  Daily        12/06/23 0417               Malayiah Mcbrayer, MD 12/06/23 9579

## 2023-12-07 DIAGNOSIS — Z6835 Body mass index (BMI) 35.0-35.9, adult: Secondary | ICD-10-CM | POA: Diagnosis not present

## 2023-12-07 DIAGNOSIS — R0789 Other chest pain: Secondary | ICD-10-CM | POA: Diagnosis not present

## 2023-12-12 ENCOUNTER — Emergency Department (HOSPITAL_COMMUNITY)

## 2023-12-12 ENCOUNTER — Other Ambulatory Visit: Payer: Self-pay

## 2023-12-12 ENCOUNTER — Encounter (HOSPITAL_COMMUNITY): Payer: Self-pay

## 2023-12-12 ENCOUNTER — Emergency Department (HOSPITAL_COMMUNITY)
Admission: EM | Admit: 2023-12-12 | Discharge: 2023-12-12 | Disposition: A | Discharge status: Home / Self Care | Source: Ambulatory Visit | Attending: Emergency Medicine | Admitting: Emergency Medicine

## 2023-12-12 DIAGNOSIS — R0602 Shortness of breath: Secondary | ICD-10-CM | POA: Insufficient documentation

## 2023-12-12 DIAGNOSIS — X58XXXA Exposure to other specified factors, initial encounter: Secondary | ICD-10-CM | POA: Insufficient documentation

## 2023-12-12 DIAGNOSIS — R918 Other nonspecific abnormal finding of lung field: Secondary | ICD-10-CM | POA: Diagnosis not present

## 2023-12-12 DIAGNOSIS — I700 Atherosclerosis of aorta: Secondary | ICD-10-CM | POA: Insufficient documentation

## 2023-12-12 DIAGNOSIS — Z7982 Long term (current) use of aspirin: Secondary | ICD-10-CM | POA: Insufficient documentation

## 2023-12-12 DIAGNOSIS — J9811 Atelectasis: Secondary | ICD-10-CM | POA: Insufficient documentation

## 2023-12-12 DIAGNOSIS — I440 Atrioventricular block, first degree: Secondary | ICD-10-CM | POA: Insufficient documentation

## 2023-12-12 DIAGNOSIS — K5730 Diverticulosis of large intestine without perforation or abscess without bleeding: Secondary | ICD-10-CM | POA: Insufficient documentation

## 2023-12-12 DIAGNOSIS — S3011XA Contusion of abdominal wall, initial encounter: Secondary | ICD-10-CM | POA: Insufficient documentation

## 2023-12-12 DIAGNOSIS — N400 Enlarged prostate without lower urinary tract symptoms: Secondary | ICD-10-CM | POA: Diagnosis not present

## 2023-12-12 DIAGNOSIS — J40 Bronchitis, not specified as acute or chronic: Secondary | ICD-10-CM | POA: Diagnosis not present

## 2023-12-12 DIAGNOSIS — J90 Pleural effusion, not elsewhere classified: Secondary | ICD-10-CM | POA: Insufficient documentation

## 2023-12-12 DIAGNOSIS — R059 Cough, unspecified: Secondary | ICD-10-CM | POA: Diagnosis not present

## 2023-12-12 DIAGNOSIS — S3991XA Unspecified injury of abdomen, initial encounter: Secondary | ICD-10-CM | POA: Diagnosis present

## 2023-12-12 LAB — BASIC METABOLIC PANEL WITH GFR
Anion gap: 7 (ref 5–15)
BUN: 15 mg/dL (ref 8–23)
CO2: 30 mmol/L (ref 22–32)
Calcium: 9.6 mg/dL (ref 8.9–10.3)
Chloride: 98 mmol/L (ref 98–111)
Creatinine, Ser: 0.76 mg/dL (ref 0.61–1.24)
GFR, Estimated: >60 mL/min (ref >60)
Glucose, Bld: 114 mg/dL — ABNORMAL HIGH (ref 70–99)
Potassium: 4.3 mmol/L (ref 3.5–5.1)
Sodium: 135 mmol/L (ref 135–145)

## 2023-12-12 LAB — HEPATIC FUNCTION PANEL
ALT: 20 U/L (ref 0–44)
AST: 30 U/L (ref 15–41)
Albumin: 3.4 g/dL — ABNORMAL LOW (ref 3.5–5.0)
Alkaline Phosphatase: 48 U/L (ref 38–126)
Bilirubin, Direct: 0.6 mg/dL — ABNORMAL HIGH (ref 0.0–0.2)
Indirect Bilirubin: 0.6 mg/dL (ref 0.3–0.9)
Total Bilirubin: 1.2 mg/dL (ref 0.0–1.2)
Total Protein: 6.1 g/dL — ABNORMAL LOW (ref 6.5–8.1)

## 2023-12-12 LAB — CBC
HCT: 38.5 % — ABNORMAL LOW (ref 39.0–52.0)
Hemoglobin: 12.4 g/dL — ABNORMAL LOW (ref 13.0–17.0)
MCH: 31.7 pg (ref 26.0–34.0)
MCHC: 32.2 g/dL (ref 30.0–36.0)
MCV: 98.5 fL (ref 80.0–100.0)
Platelets: 204 K/uL (ref 150–400)
RBC: 3.91 MIL/uL — ABNORMAL LOW (ref 4.22–5.81)
RDW: 13.2 % (ref 11.5–15.5)
WBC: 13.8 K/uL — ABNORMAL HIGH (ref 4.0–10.5)
nRBC: 0 % (ref 0.0–0.2)

## 2023-12-12 LAB — PROTIME-INR
INR: 1.2 (ref 0.8–1.2)
Prothrombin Time: 15.8 s — ABNORMAL HIGH (ref 11.4–15.2)

## 2023-12-12 LAB — RESP PANEL BY RT-PCR (RSV, FLU A&B, COVID)  RVPGX2
Influenza A by PCR: NEGATIVE
Influenza B by PCR: NEGATIVE
Resp Syncytial Virus by PCR: NEGATIVE
SARS Coronavirus 2 by RT PCR: NEGATIVE

## 2023-12-12 LAB — TROPONIN T, HIGH SENSITIVITY: Troponin T High Sensitivity: 41 ng/L — ABNORMAL HIGH (ref 0–19)

## 2023-12-12 LAB — LIPASE, BLOOD: Lipase: 16 U/L (ref 11–51)

## 2023-12-12 LAB — PRO BRAIN NATRIURETIC PEPTIDE: Pro Brain Natriuretic Peptide: 176 pg/mL (ref <300.0)

## 2023-12-12 MED ORDER — IPRATROPIUM-ALBUTEROL 0.5-2.5 (3) MG/3ML IN SOLN
3.0000 mL | Freq: Once | RESPIRATORY_TRACT | Status: AC
  Administered 2023-12-12: 3 mL via RESPIRATORY_TRACT
  Filled 2023-12-12: qty 3

## 2023-12-12 MED ORDER — AZITHROMYCIN 250 MG PO TABS: ORAL_TABLET | ORAL | 0 refills | Status: DC

## 2023-12-12 MED ORDER — ALBUTEROL SULFATE HFA 108 (90 BASE) MCG/ACT IN AERS: 1.0000 | INHALATION_SPRAY | RESPIRATORY_TRACT | 0 refills | Status: DC | PRN

## 2023-12-12 MED ORDER — AEROCHAMBER PLUS FLO-VU MISC: 2 refills | Status: AC

## 2023-12-12 MED ORDER — IOHEXOL 300 MG/ML  SOLN
100.0000 mL | Freq: Once | INTRAMUSCULAR | Status: AC | PRN
  Administered 2023-12-12: 100 mL via INTRAVENOUS

## 2023-12-12 MED ORDER — PREDNISONE 20 MG PO TABS: ORAL_TABLET | ORAL | 0 refills | Status: DC

## 2023-12-12 NOTE — Discharge Instructions (Addendum)
 1.  Start your Z-Pak today.  Also start your prednisone  today. 2.  Use the albuterol  inhaler with the spacer.  2 puffs every 4 hours for the next couple of days and then as needed. 3.  You have a very large bruise of the abdominal wall.  This will take a long time to resolve.  He will start to see it tracking down into your groin and your legs. 4.  See your doctor for recheck in the next 3 to 5 days.

## 2023-12-12 NOTE — ED Triage Notes (Signed)
 Patient said 2.5 weeks ago he was diagnosed with RSV. He thinks his congestion has worsened. Developed a large bruise across left abdomen to the left back that started 4 days ago. Denies falls. He thinks it is from the cough. Has left arm and left shoulder pain. Denies chest pain. Feels short of breath.

## 2023-12-12 NOTE — ED Provider Notes (Signed)
 Raymond Morris   CSN: [id number] Arrival date & time: 12/12/23  1209     Patient presents with: Cough   Raymond Morris is a 89 y.o. male.   HPI Reports has had a persistent cough for almost 2 weeks now he has been seen a couple of times and it is getting worse.  Patient has to sit at the end of his bed or on a chair most of the night because he cannot lie down.  He has coughed so hard he has a large bruise on his back that has tracked around the lower aspect of his abdomen.  Patient denies a fall or injury.  He has not had fever or chills.  He reports cough has been productive intermittently of yellow sputum.  Patient was seen by his PCP and recommended to come to the emergency department for further evaluation.    Prior to Admission medications   Medication Sig Start Date End Date Taking? Authorizing Provider  albuterol  (VENTOLIN  HFA) 108 (90 Base) MCG/ACT inhaler Inhale 1-2 puffs into the lungs every 4 (four) hours as needed for wheezing or shortness of breath. 12/12/23  Yes Armenta Canning, MD  azithromycin  (ZITHROMAX  Z-PAK) 250 MG tablet 2 po day one, then 1 daily x 4 days 12/12/23  Yes Armenta Canning, MD  predniSONE  (DELTASONE ) 20 MG tablet 3 tabs po day one, then 2 po daily x 4 days 12/12/23  Yes Armenta Canning, MD  Spacer/Aero-Holding Chambers (AEROCHAMBER PLUS) Device Use as instructed 12/12/23  Yes Armenta Canning, MD  albuterol  (VENTOLIN  HFA) 108 (90 Base) MCG/ACT inhaler Inhale 2 puffs into the lungs every 6 (six) hours as needed for wheezing or shortness of breath. 11/27/23   Andra Corean BROCKS, PA-C  aspirin  EC 81 MG tablet Take 81 mg by mouth daily.    [provider]  benzonatate  (TESSALON ) 100 MG capsule Take 1 capsule (100 mg total) by mouth every 8 (eight) hours. 11/27/23   Andra Corean BROCKS, PA-C  carvedilol (COREG) 12.5 MG tablet Take 12.5 mg by mouth 2 (two) times daily with a meal.     [provider]  cetirizine (ZYRTEC) 10 MG tablet Take 10 mg by mouth daily.    [provider]  Diclofenac  Sodium CR 100 MG 24 hr tablet Take 1 tablet (100 mg total) by mouth daily. 12/06/23   Palumbo, April, MD  escitalopram  (LEXAPRO ) 20 MG tablet Take 20 mg by mouth daily. 1/2 -1 tablet once daily    [provider]  esomeprazole (NEXIUM) 20 MG capsule Take 20 mg by mouth daily before breakfast.    [provider]  finasteride  (PROSCAR ) 5 MG tablet Take 1 tablet (5 mg total) by mouth daily. 10/21/20   Maranda Jamee Jacob, MD  fluticasone -salmeterol (WIXELA INHUB) 250-50 MCG/ACT AEPB Inhale 1 puff into the lungs in the morning and at bedtime. 04/13/22   Gladis Leonor CHRISTELLA, MD  furosemide (LASIX) 20 MG tablet  06/04/20   [provider]  lidocaine  (LIDODERM ) 5 % Place 1 patch onto the skin daily. Remove & Discard patch within 12 hours or as directed by MD 12/06/23   Palumbo, April, MD  methylPREDNISolone  (MEDROL  DOSEPAK) 4 MG TBPK tablet Take as directed on back of package 11/27/23   Andra Corean BROCKS, PA-C  nitroGLYCERIN (NITROSTAT) 0.4 MG SL tablet Place 0.4 mg under the tongue every 5 (five) minutes as needed for chest pain.    [provider]  olmesartan (BENICAR) 20 MG tablet Take 20 mg by mouth daily.    [provider]  potassium chloride  (KLOR-CON ) 10 MEQ tablet Take 10 mEq by mouth daily. 11/28/21   [provider]  rosuvastatin  (CRESTOR ) 20 MG tablet Take 10 mg by mouth daily.    [provider]  Tamsulosin  HCl (FLOMAX ) 0.4 MG CAPS Take 0.4 mg by mouth daily.    [provider]    Allergies: Ceclor [cefaclor], Bactrim [sulfamethoxazole-trimethoprim], Spironolactone, and Sulfa antibiotics    Review of Systems  Updated Vital Signs BP 137/74   Pulse 76   Temp 98.1 F (36.7 C)   Resp 20   Ht 5' 8 (1.727 m)   Wt 113.4 kg   SpO2 92%   BMI 38.01 kg/m   Physical Exam Constitutional:       Comments: Patient is alert with clear mental status.  No respiratory distress at rest.  HENT:     Mouth/Throat:     Pharynx: Oropharynx is clear.  Eyes:     Extraocular Movements: Extraocular movements intact.  Cardiovascular:     Rate and Rhythm: Normal rate and regular rhythm.  Pulmonary:     Comments: No respiratory distress at rest.  Metric breath sounds.  No significant wheeze or rhonchi Abdominal:     Comments: Patient has a large abdominal wall hematoma on the left flank that tracks around to the lower abdomen.  No erythema or warmth.  Abdomen is diffusely nontender except an area of hematoma  Musculoskeletal:        General: No swelling. Normal range of motion.     Right lower leg: No edema.     Left lower leg: No edema.  Skin:    General: Skin is warm and dry.  Neurological:     General: No focal deficit present.     Mental Status: He is oriented to person, place, and time.     Coordination: Coordination normal.  Psychiatric:        Mood and Affect: Mood normal.     (all labs ordered are listed, but only abnormal results are displayed) Labs Reviewed  BASIC METABOLIC PANEL WITH GFR - Abnormal; Notable for the following components:      Result Value   Glucose, Bld 114 (*)    All other components within normal limits  CBC - Abnormal; Notable for the following components:   WBC 13.8 (*)    RBC 3.91 (*)    Hemoglobin 12.4 (*)    HCT 38.5 (*)    All other components within normal limits  HEPATIC FUNCTION PANEL - Abnormal; Notable for the following components:   Total Protein 6.1 (*)    Albumin 3.4 (*)    Bilirubin, Direct 0.6 (*)    All other components within normal limits  PROTIME-INR - Abnormal; Notable for the following components:   Prothrombin Time 15.8 (*)    All other components within normal limits  TROPONIN T, HIGH SENSITIVITY - Abnormal; Notable for the following components:   Troponin T High Sensitivity 41 (*)    All other components within normal  limits  RESP PANEL BY RT-PCR (RSV, FLU A&B, COVID)  RVPGX2  PRO BRAIN NATRIURETIC PEPTIDE  LIPASE, BLOOD    EKG: EKG Interpretation Date/Time:  Wednesday December 12 2023 12:30:24 EST Ventricular Rate:  80 PR Interval:  218 QRS Duration:  102 QT Interval:  386 QTC Calculation: 446 R Axis:   -18  Text Interpretation: Sinus rhythm Borderline prolonged  PR interval Borderline left axis deviation Abnormal R-wave progression, early transition Borderline repolarization abnormality Baseline wander in lead(s) V1 agree, no acute ischemic appearance Confirmed by Armenta Canning [phone]) on 12/12/2023 5:24:42 PM  Radiology: CT CHEST ABDOMEN PELVIS W CONTRAST Result Date: 12/12/2023 CLINICAL DATA:  Sepsis.  Pneumonia symptoms. EXAM: CT CHEST, ABDOMEN, AND PELVIS WITH CONTRAST TECHNIQUE: Multidetector CT imaging of the chest, abdomen and pelvis was performed following the standard protocol during bolus administration of intravenous contrast. RADIATION DOSE REDUCTION: This exam was performed according to the departmental dose-optimization program which includes automated exposure control, adjustment of the mA and/or kV according to patient size and/or use of iterative reconstruction technique. CONTRAST:  OMNIPAQUE  IOHEXOL  300 MG/ML  SOLN COMPARISON:  Chest radiograph dated 12/12/2023 and CT abdomen pelvis dated 08/28/2023. FINDINGS: CT CHEST FINDINGS Cardiovascular: There is no cardiomegaly or pericardial effusion. Mild atherosclerotic calcification of the thoracic aorta. No evidence of dilatation or dissection. The origins of the great vessels of the aortic arch and the central pulmonary arteries are patent. Mediastinum/Nodes: No hilar or mediastinal adenopathy. The esophagus and the thyroid  gland are grossly unremarkable. No mediastinal fluid collection. Lungs/Pleura: Trace left pleural effusion. There is left lung base atelectasis. Pneumonia is less likely but not excluded. No pneumothorax. The central  airways are patent. 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 Musculoskeletal: Degenerative changes of the spine. No acute osseous pathology. CT ABDOMEN PELVIS FINDINGS No intra-abdominal free air or free fluid. Hepatobiliary: Probable fatty liver. No biliary ductal dilatation. Layering sludge or small stones within the gallbladder. No pericholecystic fluid or evidence of acute cholecystitis by CT. Pancreas: Unremarkable. No pancreatic ductal dilatation or surrounding inflammatory changes. Spleen: Normal in size without focal abnormality. Adrenals/Urinary Tract: The adrenal glands are unremarkable. There is no hydronephrosis on either side. There is symmetric enhancement and excretion of contrast by both kidneys. The visualized ureters appear unremarkable. Similar appearance of a complex cystic lesion in the dome of the bladder for which cystoscopy was recommended. Mild stranding adjacent to the dome of the bladder. Correlation with urinalysis recommended to exclude cystitis. Stomach/Bowel: There is sigmoid diverticulosis and additional scattered colonic diverticula. There is no bowel obstruction or active inflammation. Appendectomy. Vascular/Lymphatic: Mild aortoiliac atherosclerotic disease. There is a 4.5 cm infrarenal abdominal aortic aneurysm. Follow-up as per recommendation of prior CT. The IVC is unremarkable. No portal venous gas. There is no adenopathy. Reproductive: Enlarged prostate gland measuring 6 cm in transverse axial diameter. The seminal vesicles are symmetric. Other: Diffuse subcutaneous edema of the anterior pelvic wall may represent cellulitis. Clinical correlation recommended. No fluid collection. Musculoskeletal: Osteopenia with degenerative changes of the spine. No acute osseous pathology. IMPRESSION: 1. Trace left pleural effusion with left lung base atelectasis. 2. Similar appearance of a complex cystic lesion in the dome of the bladder. 3. Colonic diverticulosis. No bowel obstruction. 4. Enlarged prostate  gland. 5.  Aortic Atherosclerosis (ICD10-I70.0). Electronically Signed   By: Vanetta Chou M.D.   On: 12/12/2023 17:03   DG Chest 2 View Result Date: 12/12/2023 EXAM: 2 VIEW(S) XRAY OF THE CHEST 12/12/2023 12:31:00 PM COMPARISON: 12/06/2023 CLINICAL HISTORY: cough FINDINGS: LUNGS AND PLEURA: Streaky opacities in left lung base. Small left pleural effusion. No pneumothorax. HEART AND MEDIASTINUM: Aortic atherosclerosis. No acute abnormality of the cardiac and mediastinal silhouettes. BONES AND SOFT TISSUES: Multilevel degenerative disc disease of spine. IMPRESSION: 1. Streaky opacities in left lung base and small left pleural effusion. Electronically signed by: Evalene Coho MD 12/12/2023 12:57 PM EST RP Workstation: [id number]  Procedures   Medications Ordered in the ED  ipratropium-albuterol  (DUONEB) 0.5-2.5 (3) MG/3ML nebulizer solution 3 mL (3 mLs Nebulization Given 12/12/23 1634)  iohexol  (OMNIPAQUE ) 300 MG/ML solution 100 mL (100 mLs Intravenous Contrast Given 12/12/23 1630)                                    Medical Decision Making Amount and/or Complexity of Data Reviewed Labs: ordered. Radiology: ordered.  Risk Prescription drug management.   Patient presents as outlined.  He has worsening cough despite several evaluations.  There was also concern for a very large hematoma that developed across his flank and lower abdomen.  Patient was sent to the emergency department for further diagnostic evaluation and recommended CT imaging.  Will proceed with imaging and lab work.  Will administer a DuoNeb.   Pittore panel negative.  Metabolic panel normal.  GFR greater than 60.  White count 13.8.  Hepatic panel normal.  Troponin 41.  Chest x-ray interpreted by radiology some streaky atelectasis versus possible pneumonia left lung base.  CT abdomen pelvis redefined small left pleural effusion and some atelectasis versus possible infection of left lung base.  Otherwise no acute  findings.  Patient is clinically nontoxic and does not have hypoxia or respiratory distress.  Based on CT findings and 2 weeks of cough that is now productive we will opt to treat with a Z-Pak.  Will also treat with prednisone  and albuterol  inhaler.  Patient's hematoma is of the abdominal wall and does not appear to have any acute complications.  Patient is not on anticoagulants and no significant injury.  Conservative treatment but no specific interventions needed at this time.     Final diagnoses:  Bronchitis  Abdominal wall hematoma, initial encounter    ED Discharge Orders          Ordered    azithromycin  (ZITHROMAX  Z-PAK) 250 MG tablet        12/12/23 1729    albuterol  (VENTOLIN  HFA) 108 (90 Base) MCG/ACT inhaler  Every 4 hours PRN        12/12/23 1729    Spacer/Aero-Holding Chambers (AEROCHAMBER PLUS) Device        12/12/23 1729    predniSONE  (DELTASONE ) 20 MG tablet        12/12/23 1729               Armenta Canning, MD 12/12/23 1734

## 2024-01-07 ENCOUNTER — Ambulatory Visit: Attending: Cardiology | Admitting: Cardiology

## 2024-01-07 ENCOUNTER — Encounter: Payer: Self-pay | Admitting: Cardiology

## 2024-01-07 VITALS — BP 134/86 | HR 74 | Ht 68.0 in | Wt 240.0 lb

## 2024-01-07 DIAGNOSIS — I10 Essential (primary) hypertension: Secondary | ICD-10-CM | POA: Diagnosis not present

## 2024-01-07 DIAGNOSIS — E785 Hyperlipidemia, unspecified: Secondary | ICD-10-CM | POA: Diagnosis not present

## 2024-01-07 DIAGNOSIS — R0602 Shortness of breath: Secondary | ICD-10-CM

## 2024-01-07 DIAGNOSIS — R600 Localized edema: Secondary | ICD-10-CM | POA: Diagnosis not present

## 2024-01-07 NOTE — Progress Notes (Signed)
 " Cardiology Office Note:  .   Date:  01/10/2024  ID:  ALEE KATEN, DOB [birth year], MRN [id number] PCP: Clarice Nottingham, MD  [facility] Providers Cardiologist:  Alm Clay, MD     Chief Complaint  Patient presents with   New Patient (Initial Visit)    Referral back for exertional dyspnea.  Previously seen in 2020 and 2021 by Dr. Acharya.    Patient Profile: .     Raymond Morris is a very pleasant, moderately obese 89 y.o. male with a PMH reviewed below who presents here for Reassessmentof Exertional Dyspnea at the request of Clarice Nottingham, MD.  PMH: HTN, HLD, chronic LE edema, CVA noted on MRA in 2002, AAA last imaged in 2019 and 3.7 cm felt to be stable at that time, monoglonal gammopathy noted in 2007, reports note that Hem/Onc Dr. Timmy felt it was benign      Raymond Morris was seen by Dr. Loni in January 2022 for exertional dyspnea.  He had previously been followed by Dr. Jacques Somerset.  Had a history of abnormal Myoview in the past revealing inferior ischemia.  During this visit he was noticing exertional dyspnea but was hesitant to proceed with a stress test.  Wanted to think about it .  No chest pain or pressure.  Echocardiogram showed grossly normal EF with normal strain pattern.  Plan was to recheck 2D echo.  Subjective  Discussed the use of AI scribe software for clinical note transcription with the patient, who gave verbal consent to proceed.  History of Present Illness Raymond Morris was referred by Dr. Clarice for evaluation of his shortness of breath.He is accompanied by Raymond Morris, his daughter.  He was previously seen in cardiology consultation for similar symptoms in 2022 and declined evaluation with ischemic testing at that time.  He had an echocardiogram and an event monitor performed but no stress test.  He experiences shortness of breath during ambulation, which has steadily progressed when compared to two to three years ago but has remained stable  over the past six months. He has a history of back and hip pain that significantly impacts his mobility, often requiring him to stop walking due to severe back pain, described as feeling like it's 'gonna rip me.' This pain sometimes exacerbates his shortness of breath.No orthopnea or paroxysmal nocturnal dyspnea is present. He notes some peripheral edema in his feet when sitting for extended periods, which improves with elevation.  He recently experienced an episode of RSV before Thanksgiving, which led to increased phlegm production and coughing, causing pain in his left side.  This probably led to some exacerbation of his dyspnea which was present when he saw his PCP.  Although his breathing has slightly improved since recovering from RSV, he still cannot take as deep a breath as before.  He occasionally experiences chest pain, which is not central and occurs in various locations-with or without exertion-mostly occurs at rest or with deep inspiration.Raymond Morris He uses nitroglycerin infrequently, about once a month, to alleviate these symptoms. No chest pain occurs during routine activities unless he exerts himself more than usual. He denies any PND, orthopnea and has chronic mild lower extremity edema for which he takes furosemide 20 mg daily (along with potassium) but no additional dosing. Cardiovascular ROS: positive for - dyspnea on exertion, edema, and exercise intolerance which is also limited by his MSK pain.  Various types of chest discomfort may or may not occur with exertion and not routinely.  negative for - irregular heartbeat, orthopnea, palpitations, paroxysmal nocturnal dyspnea, rapid heart rate, shortness of breath, or syncope or near syncope, TIA or arm or severe axonal claudication.    ROS:  Review of Systems - Negative except symptoms noted above.  Mostly limited by his MSK pain.  Also he is recovering from recent bout with RSV.    Objective   Medications: carvedilol 12.5 mg twice daily,  olmesartan (Benicar) 20 mg daily, baby aspirin  81 mg daily, furosemide 20 mg daily, rosuvastatin  10 mg (1/2 of 20 mg tablet daily), potassium (Klor-Con  10 mill equivalents daily), fluticasone /salmeterol inhaler daily, albuterol  as needed, Nexium 20 mg daily, Lexapro  30 mg daily, Zyrtec 10 mg as needed, Proscar  5 mg, and Flomax  0.4 mg.  Studies Reviewed: Raymond Morris   EKG Interpretation Date/Time:  Monday January 07 2024 11:03:10 EST Ventricular Rate:  73 PR Interval:  182 QRS Duration:  98 QT Interval:  402 QTC Calculation: 442 R Axis:   -18  Text Interpretation: Sinus rhythm with occasional Premature ventricular complexes Minimal voltage criteria for LVH, may be normal variant ( R in aVL ) Nonspecific ST abnormality When compared with ECG of 12-Dec-2023 12:30, Premature ventricular complexes NOW PRESENT Otherwise no significant change Confirmed by Anner Lenis ([id number]) on 01/07/2024 11:14:03 AM    Results Labs Hemoglobin A1c (11/2023): 5.9 Glucose (11/2023): 119 Creatinine (11/2023): 0.85 Sodium (11/2023): 140 Potassium (11/2023): 4.0 Liver function panel (11/2023): Within normal limits Total cholesterol (11/2023): 124 Triglycerides (11/2023): 71 HDL (11/2023): 35 LDL (11/2023): 74 Hemoglobin (11/2023): 14.5 Platelets (11/2023): 187  Diagnostic Echocardiogram (12/18/2019): Preserved left ventricular systolic function, diastolic dysfunction, no significant structural abnormalities  Normal EF 55 to 60%.  No RWMA.  G1 DD.  Normal RV size and function.  Mild AV sclerosis with no stenosis.  Normal RAP. Cardiac stress test (09/08/2010): Abnormal, possible artifact - EF greater than 70%.  Abnormal EKG response to exercise at low exercise.  Suggestion of inferior ischemia.  (Cannot exclude diaphragmatic attenuation) => no further evaluation at that time.   Risk Assessment/Calculations:         Physical Exam:   VS:  BP 134/86   Pulse 74   Ht 5' 8 (1.727 m)   Wt 240 lb (108.9 kg)   SpO2 96%    BMI 36.49 kg/m    Wt Readings from Last 3 Encounters:  01/07/24 240 lb (108.9 kg)  12/12/23 250 lb (113.4 kg)  12/06/23 239 lb 3.2 oz (108.5 kg)     GEN: Well nourished, well developed in no acute distress; moderately obese-truncal obesity.  Very hard of hearing. NECK: No JVD; No carotid bruits CARDIAC: RRR with ectopy; Distant but normal S1, S2;  no murmurs, rubs, gallops RESPIRATORY:  Clear to auscultation without rales, wheezing or rhonchi ; nonlabored, good air movement. ABDOMEN: Soft, non-tender, non-distended EXTREMITIES: Trivial 1+ BLE edema; No deformity      ASSESSMENT AND PLAN: .    SOB (shortness of breath) on exertion Assessment & Plan: Chronic exertional dyspnea Stable over the last six months despite recent RSV infection. Echocardiogram four years ago showed normal heart function.   Long discussion with patient and daughter, and we agreed with plan for conservative management due to age and symptom stability.-In the past he had declined ischemic evaluation and would be less than favorable for invasive evaluation in the absence of active anginal chest pain symptoms.  As such, with his reluctance to pursue invasive evaluation, would prefer to avoid noninvasive evaluation in the absence of  progressively worsening symptoms.  - Continue current management  => Continue to be as active as possible.  Monitor for symptoms. - Scheduled follow-up in six months to reassess symptoms. - Advised to contact office if symptoms worsen, such as increased dyspnea or chest pain. - Continue to treat Allergies/Reactive Airways Disease with Zyrtec and as needed albuterol  as well as fluticasone /albuterol .  Orders: -     EKG 12-Lead  Hyperlipidemia, unspecified hyperlipidemia type Assessment & Plan: Managed with rosuvastatin . Recent labs show LDL slightly elevated at 74 but within acceptable range. - Continue rosuvastatin  therapy-10 mg   Essential hypertension, benign Assessment &  Plan: Well-controlled with carvedilol 12.5 mg twice daily and olmesartan 20 mg daily. - Continue current antihypertensive regimen.   Bilateral lower extremity edema Assessment & Plan: Mild BLE edema managed with 20 mg furosemide.  Has not required any additional dosing.  Recommended continued foot elevation and continued use of support stockings.  In the absence of PND, and orthopnea with previous echocardiogram showing relative normal findings, not likely to be cardiac in nature.     Benign prostatic hyperplasia with lower urinary tract symptoms Managed with Proscar  and Flomax . Under urologist care for further management. - Continue current medications for BPH. - Continue follow-up with urologist.  Osteoarthritis of hip and lumbar spine Causes significant pain and limits mobility, contributing to exertional dyspnea.  Chronic allergic rhinitis Managed with Zyrtec. - Continue Zyrtec for allergy management.  Gastroesophageal reflux disease Managed with Nexium. - Continue Nexium for GERD management.       Follow-Up: Return in about 6 months (around 07/06/2024) for 6 month follow-up with me, [facility] [address], To discuss symptoms .     Signed, Alm MICAEL Clay, MD, MS Alm Clay, M.D., M.S. Interventional Cardiologist  [facility] Pager # [phone]      "

## 2024-01-07 NOTE — Patient Instructions (Signed)
Medication Instructions:   No changes *If you need a refill on your cardiac medications before your next appointment, please call your pharmacy*   Lab Work: Not needed .   Testing/Procedures:  Not needed  Follow-Up: At [facility], you and your health needs are our priority.  As part of our continuing mission to provide you with exceptional heart care, we have created designated Provider Care Teams.  These Care Teams include your primary Cardiologist (physician) and Advanced Practice Providers (APPs -  Physician Assistants and Nurse Practitioners) who all work together to provide you with the care you need, when you need it.     Your next appointment:   6 month(s)  The format for your next appointment:   In Person  Provider:   David Harding, MD   Other Instructions 

## 2024-01-10 ENCOUNTER — Encounter: Payer: Self-pay | Admitting: Cardiology

## 2024-01-10 DIAGNOSIS — R600 Localized edema: Secondary | ICD-10-CM | POA: Insufficient documentation

## 2024-01-10 DIAGNOSIS — R0602 Shortness of breath: Secondary | ICD-10-CM | POA: Insufficient documentation

## 2024-01-10 NOTE — Assessment & Plan Note (Addendum)
 Managed with rosuvastatin . Recent labs show LDL slightly elevated at 74 but within acceptable range. - Continue rosuvastatin  therapy-10 mg

## 2024-01-10 NOTE — Assessment & Plan Note (Signed)
 Mild BLE edema managed with 20 mg furosemide.  Has not required any additional dosing.  Recommended continued foot elevation and continued use of support stockings.  In the absence of PND, and orthopnea with previous echocardiogram showing relative normal findings, not likely to be cardiac in nature.

## 2024-01-10 NOTE — Assessment & Plan Note (Signed)
 Well-controlled with carvedilol 12.5 mg twice daily and olmesartan 20 mg daily. - Continue current antihypertensive regimen.

## 2024-01-10 NOTE — Assessment & Plan Note (Addendum)
 Chronic exertional dyspnea Stable over the last six months despite recent RSV infection. Echocardiogram four years ago showed normal heart function.   Long discussion with patient and daughter, and we agreed with plan for conservative management due to age and symptom stability.-In the past he had declined ischemic evaluation and would be less than favorable for invasive evaluation in the absence of active anginal chest pain symptoms.  As such, with his reluctance to pursue invasive evaluation, would prefer to avoid noninvasive evaluation in the absence of progressively worsening symptoms.  - Continue current management  => Continue to be as active as possible.  Monitor for symptoms. - Scheduled follow-up in six months to reassess symptoms. - Advised to contact office if symptoms worsen, such as increased dyspnea or chest pain. - Continue to treat Allergies/Reactive Airways Disease with Zyrtec and as needed albuterol  as well as fluticasone /albuterol .

## 2024-01-21 ENCOUNTER — Other Ambulatory Visit: Payer: Self-pay

## 2024-01-21 DIAGNOSIS — D472 Monoclonal gammopathy: Secondary | ICD-10-CM

## 2024-01-22 ENCOUNTER — Inpatient Hospital Stay: Attending: Hematology and Oncology

## 2024-01-22 DIAGNOSIS — D472 Monoclonal gammopathy: Secondary | ICD-10-CM | POA: Diagnosis present

## 2024-01-22 LAB — CBC WITH DIFFERENTIAL (CANCER CENTER ONLY)
Abs Immature Granulocytes: 0.01 K/uL (ref 0.00–0.07)
Basophils Absolute: 0.1 K/uL (ref 0.0–0.1)
Basophils Relative: 1 %
Eosinophils Absolute: 0.2 K/uL (ref 0.0–0.5)
Eosinophils Relative: 3 %
HCT: 41 % (ref 39.0–52.0)
Hemoglobin: 13.6 g/dL (ref 13.0–17.0)
Immature Granulocytes: 0 %
Lymphocytes Relative: 22 %
Lymphs Abs: 1.3 K/uL (ref 0.7–4.0)
MCH: 32 pg (ref 26.0–34.0)
MCHC: 33.2 g/dL (ref 30.0–36.0)
MCV: 96.5 fL (ref 80.0–100.0)
Monocytes Absolute: 0.7 K/uL (ref 0.1–1.0)
Monocytes Relative: 11 %
Neutro Abs: 3.9 K/uL (ref 1.7–7.7)
Neutrophils Relative %: 63 %
Platelet Count: 221 K/uL (ref 150–400)
RBC: 4.25 MIL/uL (ref 4.22–5.81)
RDW: 13.7 % (ref 11.5–15.5)
WBC Count: 6.1 K/uL (ref 4.0–10.5)
nRBC: 0 % (ref 0.0–0.2)

## 2024-01-22 LAB — CMP (CANCER CENTER ONLY)
ALT: 12 U/L (ref 0–44)
AST: 21 U/L (ref 15–41)
Albumin: 3.8 g/dL (ref 3.5–5.0)
Alkaline Phosphatase: 60 U/L (ref 38–126)
Anion gap: 8 (ref 5–15)
BUN: 14 mg/dL (ref 8–23)
CO2: 27 mmol/L (ref 22–32)
Calcium: 9.6 mg/dL (ref 8.9–10.3)
Chloride: 102 mmol/L (ref 98–111)
Creatinine: 0.89 mg/dL (ref 0.61–1.24)
GFR, Estimated: >60 mL/min
Glucose, Bld: 111 mg/dL — ABNORMAL HIGH (ref 70–99)
Potassium: 4.4 mmol/L (ref 3.5–5.1)
Sodium: 137 mmol/L (ref 135–145)
Total Bilirubin: 0.6 mg/dL (ref 0.0–1.2)
Total Protein: 6.5 g/dL (ref 6.5–8.1)

## 2024-01-22 LAB — LACTATE DEHYDROGENASE: LDH: 175 U/L (ref 105–235)

## 2024-01-23 LAB — KAPPA/LAMBDA LIGHT CHAINS
Kappa free light chain: 34.6 mg/L — ABNORMAL HIGH (ref 3.3–19.4)
Kappa, lambda light chain ratio: 2.31 — ABNORMAL HIGH (ref 0.26–1.65)
Lambda free light chains: 15 mg/L (ref 5.7–26.3)

## 2024-01-24 LAB — MULTIPLE MYELOMA PANEL, SERUM
Albumin SerPl Elph-Mcnc: 3.1 g/dL (ref 2.9–4.4)
Albumin/Glob SerPl: 1.2 (ref 0.7–1.7)
Alpha 1: 0.2 g/dL (ref 0.0–0.4)
Alpha2 Glob SerPl Elph-Mcnc: 0.8 g/dL (ref 0.4–1.0)
B-Globulin SerPl Elph-Mcnc: 0.9 g/dL (ref 0.7–1.3)
Gamma Glob SerPl Elph-Mcnc: 1 g/dL (ref 0.4–1.8)
Globulin, Total: 2.8 g/dL (ref 2.2–3.9)
IgA: 145 mg/dL (ref 61–437)
IgG (Immunoglobin G), Serum: 1109 mg/dL (ref 603–1613)
IgM (Immunoglobulin M), Srm: 17 mg/dL (ref 15–143)
M Protein SerPl Elph-Mcnc: 0.6 g/dL — ABNORMAL HIGH
Total Protein ELP: 5.9 g/dL — ABNORMAL LOW (ref 6.0–8.5)

## 2024-01-29 ENCOUNTER — Inpatient Hospital Stay: Admitting: Physician Assistant

## 2024-01-29 ENCOUNTER — Telehealth: Payer: Self-pay

## 2024-01-29 DIAGNOSIS — D472 Monoclonal gammopathy: Secondary | ICD-10-CM

## 2024-01-29 NOTE — Telephone Encounter (Signed)
 Spoke with pt who reported he did not have transportation in the bad weather to make his appointment.  Would like to be rescheduled. Pt asked for update on recent lab results.   Advised nurse would update him after Dr Federico has reviewed the findings. Dr Federico made aware.

## 2024-01-29 NOTE — Telephone Encounter (Signed)
 Spoke with pt and gave following information from Dr Federico Please let Raymond Morris know that his [facility] labs look stable. There has been no concerning changes in his bloodwork. As long as he is feeling well we could reschedule his next visit and labs in 6 months. If he has questions or new symptoms we can schedule him back in the next 1-2 weeks.  Pt voiced understanding.  Request for appointment time in 6 months sent to scheduling.

## 2024-02-05 ENCOUNTER — Inpatient Hospital Stay (HOSPITAL_COMMUNITY): Admission: RE | Admit: 2024-02-05 | Source: Ambulatory Visit

## 2024-02-07 ENCOUNTER — Telehealth: Payer: Self-pay | Admitting: Hematology and Oncology

## 2024-02-07 NOTE — Telephone Encounter (Signed)
 Called pt to get future appt scheduled

## 2024-02-08 ENCOUNTER — Ambulatory Visit (HOSPITAL_COMMUNITY): Admission: RE | Admit: 2024-02-08

## 2024-02-08 DIAGNOSIS — D472 Monoclonal gammopathy: Secondary | ICD-10-CM

## 2024-07-30 ENCOUNTER — Inpatient Hospital Stay: Admitting: Hematology and Oncology

## 2024-07-30 ENCOUNTER — Inpatient Hospital Stay: Attending: Hematology and Oncology
